# Patient Record
Sex: Male | Born: 1935
Health system: Southern US, Community
[De-identification: ages and names within clinical notes are randomized; demographics above are authoritative.]

## PROBLEM LIST (undated history)

## (undated) DIAGNOSIS — R002 Palpitations: Secondary | ICD-10-CM

## (undated) DIAGNOSIS — F329 Major depressive disorder, single episode, unspecified: Secondary | ICD-10-CM

## (undated) DIAGNOSIS — M7989 Other specified soft tissue disorders: Secondary | ICD-10-CM

## (undated) DIAGNOSIS — K219 Gastro-esophageal reflux disease without esophagitis: Secondary | ICD-10-CM

## (undated) DIAGNOSIS — J449 Chronic obstructive pulmonary disease, unspecified: Secondary | ICD-10-CM

## (undated) DIAGNOSIS — I639 Cerebral infarction, unspecified: Secondary | ICD-10-CM

## (undated) DIAGNOSIS — F32A Depression, unspecified: Secondary | ICD-10-CM

## (undated) HISTORY — DX: Cerebral infarction, unspecified: I63.9

## (undated) HISTORY — DX: Chronic obstructive pulmonary disease, unspecified: J44.9

## (undated) HISTORY — PX: GALLBLADDER SURGERY: SHX652

## (undated) HISTORY — DX: Palpitations: R00.2

## (undated) HISTORY — DX: Major depressive disorder, single episode, unspecified: F32.9

## (undated) HISTORY — DX: Depression, unspecified: F32.A

## (undated) HISTORY — DX: Other specified soft tissue disorders: M79.89

## (undated) HISTORY — PX: HERNIA REPAIR: SHX51

## (undated) HISTORY — DX: Gastro-esophageal reflux disease without esophagitis: K21.9

---

## 1999-04-04 ENCOUNTER — Ambulatory Visit: Admission: RE | Admit: 1999-04-04 | Discharge: 1999-04-04 | Payer: Self-pay | Admitting: Pulmonary Disease

## 1999-07-22 ENCOUNTER — Ambulatory Visit: Admission: RE | Admit: 1999-07-22 | Discharge: 1999-07-22 | Payer: Self-pay | Admitting: Pulmonary Disease

## 2004-09-12 ENCOUNTER — Ambulatory Visit (HOSPITAL_COMMUNITY): Admission: RE | Admit: 2004-09-12 | Discharge: 2004-09-12 | Payer: Self-pay | Admitting: Urology

## 2005-01-12 ENCOUNTER — Ambulatory Visit: Payer: Self-pay | Admitting: Cardiology

## 2005-01-12 ENCOUNTER — Observation Stay (HOSPITAL_COMMUNITY): Admission: EM | Admit: 2005-01-12 | Discharge: 2005-01-13 | Payer: Self-pay | Admitting: Emergency Medicine

## 2006-06-27 ENCOUNTER — Encounter: Admission: RE | Admit: 2006-06-27 | Discharge: 2006-06-27 | Payer: Self-pay | Admitting: Cardiovascular Disease

## 2006-07-03 ENCOUNTER — Ambulatory Visit (HOSPITAL_COMMUNITY): Admission: RE | Admit: 2006-07-03 | Discharge: 2006-07-03 | Payer: Self-pay | Admitting: Cardiovascular Disease

## 2010-10-26 DIAGNOSIS — F329 Major depressive disorder, single episode, unspecified: Secondary | ICD-10-CM | POA: Insufficient documentation

## 2010-10-26 DIAGNOSIS — R002 Palpitations: Secondary | ICD-10-CM | POA: Insufficient documentation

## 2010-10-26 DIAGNOSIS — K219 Gastro-esophageal reflux disease without esophagitis: Secondary | ICD-10-CM

## 2010-10-26 DIAGNOSIS — E119 Type 2 diabetes mellitus without complications: Secondary | ICD-10-CM | POA: Insufficient documentation

## 2010-10-26 DIAGNOSIS — M7989 Other specified soft tissue disorders: Secondary | ICD-10-CM | POA: Insufficient documentation

## 2010-10-26 DIAGNOSIS — J449 Chronic obstructive pulmonary disease, unspecified: Secondary | ICD-10-CM

## 2015-03-24 DIAGNOSIS — H353111 Nonexudative age-related macular degeneration, right eye, early dry stage: Secondary | ICD-10-CM | POA: Diagnosis not present

## 2015-03-24 DIAGNOSIS — H2513 Age-related nuclear cataract, bilateral: Secondary | ICD-10-CM | POA: Diagnosis not present

## 2015-03-24 DIAGNOSIS — E119 Type 2 diabetes mellitus without complications: Secondary | ICD-10-CM | POA: Diagnosis not present

## 2015-03-24 DIAGNOSIS — Z7984 Long term (current) use of oral hypoglycemic drugs: Secondary | ICD-10-CM | POA: Diagnosis not present

## 2015-04-08 DIAGNOSIS — Z7901 Long term (current) use of anticoagulants: Secondary | ICD-10-CM | POA: Diagnosis not present

## 2015-05-09 DIAGNOSIS — Z7901 Long term (current) use of anticoagulants: Secondary | ICD-10-CM | POA: Diagnosis not present

## 2015-06-10 DIAGNOSIS — K219 Gastro-esophageal reflux disease without esophagitis: Secondary | ICD-10-CM | POA: Diagnosis not present

## 2015-06-10 DIAGNOSIS — Z8673 Personal history of transient ischemic attack (TIA), and cerebral infarction without residual deficits: Secondary | ICD-10-CM | POA: Diagnosis not present

## 2015-06-10 DIAGNOSIS — I4891 Unspecified atrial fibrillation: Secondary | ICD-10-CM | POA: Diagnosis not present

## 2015-06-10 DIAGNOSIS — E119 Type 2 diabetes mellitus without complications: Secondary | ICD-10-CM | POA: Diagnosis not present

## 2015-06-10 DIAGNOSIS — E538 Deficiency of other specified B group vitamins: Secondary | ICD-10-CM | POA: Diagnosis not present

## 2015-06-10 DIAGNOSIS — Z1389 Encounter for screening for other disorder: Secondary | ICD-10-CM | POA: Diagnosis not present

## 2015-06-10 DIAGNOSIS — E669 Obesity, unspecified: Secondary | ICD-10-CM | POA: Diagnosis not present

## 2015-06-10 DIAGNOSIS — R791 Abnormal coagulation profile: Secondary | ICD-10-CM | POA: Diagnosis not present

## 2015-06-10 DIAGNOSIS — I10 Essential (primary) hypertension: Secondary | ICD-10-CM | POA: Diagnosis not present

## 2015-06-24 DIAGNOSIS — Z7901 Long term (current) use of anticoagulants: Secondary | ICD-10-CM | POA: Diagnosis not present

## 2015-07-26 DIAGNOSIS — Z7901 Long term (current) use of anticoagulants: Secondary | ICD-10-CM | POA: Diagnosis not present

## 2015-08-29 DIAGNOSIS — Z7901 Long term (current) use of anticoagulants: Secondary | ICD-10-CM | POA: Diagnosis not present

## 2015-10-03 DIAGNOSIS — I10 Essential (primary) hypertension: Secondary | ICD-10-CM | POA: Diagnosis not present

## 2015-10-03 DIAGNOSIS — I4891 Unspecified atrial fibrillation: Secondary | ICD-10-CM | POA: Diagnosis not present

## 2015-10-03 DIAGNOSIS — M791 Myalgia: Secondary | ICD-10-CM | POA: Diagnosis not present

## 2015-10-03 DIAGNOSIS — Z139 Encounter for screening, unspecified: Secondary | ICD-10-CM | POA: Diagnosis not present

## 2015-10-03 DIAGNOSIS — Z125 Encounter for screening for malignant neoplasm of prostate: Secondary | ICD-10-CM | POA: Diagnosis not present

## 2015-10-03 DIAGNOSIS — E119 Type 2 diabetes mellitus without complications: Secondary | ICD-10-CM | POA: Diagnosis not present

## 2015-10-03 DIAGNOSIS — K219 Gastro-esophageal reflux disease without esophagitis: Secondary | ICD-10-CM | POA: Diagnosis not present

## 2015-10-03 DIAGNOSIS — Z7901 Long term (current) use of anticoagulants: Secondary | ICD-10-CM | POA: Diagnosis not present

## 2015-10-03 DIAGNOSIS — E782 Mixed hyperlipidemia: Secondary | ICD-10-CM | POA: Diagnosis not present

## 2015-10-03 DIAGNOSIS — F329 Major depressive disorder, single episode, unspecified: Secondary | ICD-10-CM | POA: Diagnosis not present

## 2015-11-04 DIAGNOSIS — R791 Abnormal coagulation profile: Secondary | ICD-10-CM | POA: Diagnosis not present

## 2015-12-09 DIAGNOSIS — Z7901 Long term (current) use of anticoagulants: Secondary | ICD-10-CM | POA: Diagnosis not present

## 2015-12-22 DIAGNOSIS — Z6832 Body mass index (BMI) 32.0-32.9, adult: Secondary | ICD-10-CM | POA: Diagnosis not present

## 2015-12-22 DIAGNOSIS — Z23 Encounter for immunization: Secondary | ICD-10-CM | POA: Diagnosis not present

## 2015-12-22 DIAGNOSIS — N281 Cyst of kidney, acquired: Secondary | ICD-10-CM | POA: Diagnosis not present

## 2015-12-22 DIAGNOSIS — R05 Cough: Secondary | ICD-10-CM | POA: Diagnosis not present

## 2015-12-22 DIAGNOSIS — F329 Major depressive disorder, single episode, unspecified: Secondary | ICD-10-CM | POA: Diagnosis not present

## 2016-01-12 DIAGNOSIS — N401 Enlarged prostate with lower urinary tract symptoms: Secondary | ICD-10-CM | POA: Diagnosis not present

## 2016-01-12 DIAGNOSIS — R338 Other retention of urine: Secondary | ICD-10-CM | POA: Diagnosis not present

## 2016-01-12 DIAGNOSIS — N281 Cyst of kidney, acquired: Secondary | ICD-10-CM | POA: Diagnosis not present

## 2016-01-12 DIAGNOSIS — N538 Other male sexual dysfunction: Secondary | ICD-10-CM | POA: Diagnosis not present

## 2016-01-16 DIAGNOSIS — N318 Other neuromuscular dysfunction of bladder: Secondary | ICD-10-CM | POA: Diagnosis not present

## 2016-01-16 DIAGNOSIS — N401 Enlarged prostate with lower urinary tract symptoms: Secondary | ICD-10-CM | POA: Diagnosis not present

## 2016-01-16 DIAGNOSIS — R791 Abnormal coagulation profile: Secondary | ICD-10-CM | POA: Diagnosis not present

## 2016-01-16 DIAGNOSIS — N3020 Other chronic cystitis without hematuria: Secondary | ICD-10-CM | POA: Diagnosis not present

## 2016-01-19 DIAGNOSIS — N3020 Other chronic cystitis without hematuria: Secondary | ICD-10-CM | POA: Diagnosis not present

## 2016-01-19 DIAGNOSIS — R350 Frequency of micturition: Secondary | ICD-10-CM | POA: Diagnosis not present

## 2016-01-19 DIAGNOSIS — N411 Chronic prostatitis: Secondary | ICD-10-CM | POA: Diagnosis not present

## 2016-01-19 DIAGNOSIS — Z01810 Encounter for preprocedural cardiovascular examination: Secondary | ICD-10-CM | POA: Diagnosis not present

## 2016-01-19 DIAGNOSIS — J449 Chronic obstructive pulmonary disease, unspecified: Secondary | ICD-10-CM | POA: Diagnosis not present

## 2016-01-19 DIAGNOSIS — N401 Enlarged prostate with lower urinary tract symptoms: Secondary | ICD-10-CM | POA: Diagnosis not present

## 2016-01-19 DIAGNOSIS — R351 Nocturia: Secondary | ICD-10-CM | POA: Diagnosis not present

## 2016-01-19 DIAGNOSIS — R338 Other retention of urine: Secondary | ICD-10-CM | POA: Diagnosis not present

## 2016-01-19 DIAGNOSIS — N410 Acute prostatitis: Secondary | ICD-10-CM | POA: Diagnosis not present

## 2016-01-19 DIAGNOSIS — E119 Type 2 diabetes mellitus without complications: Secondary | ICD-10-CM | POA: Diagnosis not present

## 2016-01-19 DIAGNOSIS — I10 Essential (primary) hypertension: Secondary | ICD-10-CM | POA: Diagnosis not present

## 2016-01-19 DIAGNOSIS — N318 Other neuromuscular dysfunction of bladder: Secondary | ICD-10-CM | POA: Diagnosis not present

## 2016-01-20 DIAGNOSIS — N401 Enlarged prostate with lower urinary tract symptoms: Secondary | ICD-10-CM | POA: Diagnosis not present

## 2016-01-20 DIAGNOSIS — E119 Type 2 diabetes mellitus without complications: Secondary | ICD-10-CM | POA: Diagnosis not present

## 2016-01-20 DIAGNOSIS — J449 Chronic obstructive pulmonary disease, unspecified: Secondary | ICD-10-CM | POA: Diagnosis not present

## 2016-01-20 DIAGNOSIS — N410 Acute prostatitis: Secondary | ICD-10-CM | POA: Diagnosis not present

## 2016-01-20 DIAGNOSIS — N411 Chronic prostatitis: Secondary | ICD-10-CM | POA: Diagnosis not present

## 2016-01-20 DIAGNOSIS — R338 Other retention of urine: Secondary | ICD-10-CM | POA: Diagnosis not present

## 2016-01-20 DIAGNOSIS — I10 Essential (primary) hypertension: Secondary | ICD-10-CM | POA: Diagnosis not present

## 2016-01-20 DIAGNOSIS — R351 Nocturia: Secondary | ICD-10-CM | POA: Diagnosis not present

## 2016-01-20 DIAGNOSIS — R350 Frequency of micturition: Secondary | ICD-10-CM | POA: Diagnosis not present

## 2016-01-21 DIAGNOSIS — R350 Frequency of micturition: Secondary | ICD-10-CM | POA: Diagnosis not present

## 2016-01-21 DIAGNOSIS — N411 Chronic prostatitis: Secondary | ICD-10-CM | POA: Diagnosis not present

## 2016-01-21 DIAGNOSIS — R351 Nocturia: Secondary | ICD-10-CM | POA: Diagnosis not present

## 2016-01-21 DIAGNOSIS — E119 Type 2 diabetes mellitus without complications: Secondary | ICD-10-CM | POA: Diagnosis not present

## 2016-01-21 DIAGNOSIS — I10 Essential (primary) hypertension: Secondary | ICD-10-CM | POA: Diagnosis not present

## 2016-01-21 DIAGNOSIS — R338 Other retention of urine: Secondary | ICD-10-CM | POA: Diagnosis not present

## 2016-01-21 DIAGNOSIS — J449 Chronic obstructive pulmonary disease, unspecified: Secondary | ICD-10-CM | POA: Diagnosis not present

## 2016-01-21 DIAGNOSIS — N410 Acute prostatitis: Secondary | ICD-10-CM | POA: Diagnosis not present

## 2016-01-21 DIAGNOSIS — N401 Enlarged prostate with lower urinary tract symptoms: Secondary | ICD-10-CM | POA: Diagnosis not present

## 2016-01-23 DIAGNOSIS — R791 Abnormal coagulation profile: Secondary | ICD-10-CM | POA: Diagnosis not present

## 2016-01-23 DIAGNOSIS — N3020 Other chronic cystitis without hematuria: Secondary | ICD-10-CM | POA: Diagnosis not present

## 2016-01-23 DIAGNOSIS — R351 Nocturia: Secondary | ICD-10-CM | POA: Diagnosis not present

## 2016-01-23 DIAGNOSIS — N401 Enlarged prostate with lower urinary tract symptoms: Secondary | ICD-10-CM | POA: Diagnosis not present

## 2016-01-30 DIAGNOSIS — N401 Enlarged prostate with lower urinary tract symptoms: Secondary | ICD-10-CM | POA: Diagnosis not present

## 2016-01-30 DIAGNOSIS — N3020 Other chronic cystitis without hematuria: Secondary | ICD-10-CM | POA: Diagnosis not present

## 2016-02-01 DIAGNOSIS — I4891 Unspecified atrial fibrillation: Secondary | ICD-10-CM | POA: Diagnosis not present

## 2016-02-01 DIAGNOSIS — R319 Hematuria, unspecified: Secondary | ICD-10-CM | POA: Diagnosis not present

## 2016-02-28 DIAGNOSIS — R351 Nocturia: Secondary | ICD-10-CM | POA: Diagnosis not present

## 2016-02-28 DIAGNOSIS — N3020 Other chronic cystitis without hematuria: Secondary | ICD-10-CM | POA: Diagnosis not present

## 2016-02-28 DIAGNOSIS — N318 Other neuromuscular dysfunction of bladder: Secondary | ICD-10-CM | POA: Diagnosis not present

## 2016-02-28 DIAGNOSIS — N401 Enlarged prostate with lower urinary tract symptoms: Secondary | ICD-10-CM | POA: Diagnosis not present

## 2017-03-21 DIAGNOSIS — I4891 Unspecified atrial fibrillation: Secondary | ICD-10-CM | POA: Diagnosis not present

## 2017-03-25 DIAGNOSIS — Z7901 Long term (current) use of anticoagulants: Secondary | ICD-10-CM | POA: Diagnosis not present

## 2017-04-09 DIAGNOSIS — R791 Abnormal coagulation profile: Secondary | ICD-10-CM | POA: Diagnosis not present

## 2017-04-11 DIAGNOSIS — Z7901 Long term (current) use of anticoagulants: Secondary | ICD-10-CM | POA: Diagnosis not present

## 2017-04-25 DIAGNOSIS — R791 Abnormal coagulation profile: Secondary | ICD-10-CM | POA: Diagnosis not present

## 2017-05-02 DIAGNOSIS — R791 Abnormal coagulation profile: Secondary | ICD-10-CM | POA: Diagnosis not present

## 2017-05-09 DIAGNOSIS — R791 Abnormal coagulation profile: Secondary | ICD-10-CM | POA: Diagnosis not present

## 2017-05-16 DIAGNOSIS — R791 Abnormal coagulation profile: Secondary | ICD-10-CM | POA: Diagnosis not present

## 2017-05-23 DIAGNOSIS — Z7901 Long term (current) use of anticoagulants: Secondary | ICD-10-CM | POA: Diagnosis not present

## 2017-05-27 DIAGNOSIS — N318 Other neuromuscular dysfunction of bladder: Secondary | ICD-10-CM | POA: Diagnosis not present

## 2017-05-27 DIAGNOSIS — R351 Nocturia: Secondary | ICD-10-CM | POA: Diagnosis not present

## 2017-05-27 DIAGNOSIS — N401 Enlarged prostate with lower urinary tract symptoms: Secondary | ICD-10-CM | POA: Diagnosis not present

## 2017-05-27 DIAGNOSIS — N3020 Other chronic cystitis without hematuria: Secondary | ICD-10-CM | POA: Diagnosis not present

## 2017-06-06 DIAGNOSIS — R791 Abnormal coagulation profile: Secondary | ICD-10-CM | POA: Diagnosis not present

## 2017-06-20 DIAGNOSIS — Z7901 Long term (current) use of anticoagulants: Secondary | ICD-10-CM | POA: Diagnosis not present

## 2017-07-25 DIAGNOSIS — R791 Abnormal coagulation profile: Secondary | ICD-10-CM | POA: Diagnosis not present

## 2017-08-08 DIAGNOSIS — R791 Abnormal coagulation profile: Secondary | ICD-10-CM | POA: Diagnosis not present

## 2017-08-22 DIAGNOSIS — Z7901 Long term (current) use of anticoagulants: Secondary | ICD-10-CM | POA: Diagnosis not present

## 2017-09-25 DIAGNOSIS — Z7901 Long term (current) use of anticoagulants: Secondary | ICD-10-CM | POA: Diagnosis not present

## 2017-10-21 DIAGNOSIS — J441 Chronic obstructive pulmonary disease with (acute) exacerbation: Secondary | ICD-10-CM | POA: Diagnosis not present

## 2017-10-21 DIAGNOSIS — Z6834 Body mass index (BMI) 34.0-34.9, adult: Secondary | ICD-10-CM | POA: Diagnosis not present

## 2017-10-21 DIAGNOSIS — J029 Acute pharyngitis, unspecified: Secondary | ICD-10-CM | POA: Diagnosis not present

## 2017-10-30 DIAGNOSIS — Z7901 Long term (current) use of anticoagulants: Secondary | ICD-10-CM | POA: Diagnosis not present

## 2017-11-08 DIAGNOSIS — Z6834 Body mass index (BMI) 34.0-34.9, adult: Secondary | ICD-10-CM | POA: Diagnosis not present

## 2017-11-08 DIAGNOSIS — I509 Heart failure, unspecified: Secondary | ICD-10-CM | POA: Diagnosis not present

## 2017-11-14 ENCOUNTER — Emergency Department (HOSPITAL_COMMUNITY): Payer: Medicare HMO

## 2017-11-14 ENCOUNTER — Other Ambulatory Visit: Payer: Self-pay

## 2017-11-14 ENCOUNTER — Inpatient Hospital Stay (HOSPITAL_COMMUNITY)
Admission: EM | Admit: 2017-11-14 | Discharge: 2017-11-20 | DRG: 061 | Disposition: A | Discharge status: Rehabilitation Facility | Payer: Medicare HMO | Attending: Neurology | Admitting: Neurology

## 2017-11-14 DIAGNOSIS — F329 Major depressive disorder, single episode, unspecified: Secondary | ICD-10-CM | POA: Diagnosis present

## 2017-11-14 DIAGNOSIS — I5021 Acute systolic (congestive) heart failure: Secondary | ICD-10-CM | POA: Diagnosis not present

## 2017-11-14 DIAGNOSIS — I6300 Cerebral infarction due to thrombosis of unspecified precerebral artery: Secondary | ICD-10-CM | POA: Diagnosis not present

## 2017-11-14 DIAGNOSIS — I429 Cardiomyopathy, unspecified: Secondary | ICD-10-CM | POA: Diagnosis present

## 2017-11-14 DIAGNOSIS — Z6835 Body mass index (BMI) 35.0-35.9, adult: Secondary | ICD-10-CM

## 2017-11-14 DIAGNOSIS — J449 Chronic obstructive pulmonary disease, unspecified: Secondary | ICD-10-CM | POA: Diagnosis not present

## 2017-11-14 DIAGNOSIS — E1159 Type 2 diabetes mellitus with other circulatory complications: Secondary | ICD-10-CM | POA: Diagnosis not present

## 2017-11-14 DIAGNOSIS — Z87891 Personal history of nicotine dependence: Secondary | ICD-10-CM | POA: Diagnosis not present

## 2017-11-14 DIAGNOSIS — I69354 Hemiplegia and hemiparesis following cerebral infarction affecting left non-dominant side: Secondary | ICD-10-CM | POA: Diagnosis not present

## 2017-11-14 DIAGNOSIS — I639 Cerebral infarction, unspecified: Secondary | ICD-10-CM

## 2017-11-14 DIAGNOSIS — R402252 Coma scale, best verbal response, oriented, at arrival to emergency department: Secondary | ICD-10-CM | POA: Diagnosis present

## 2017-11-14 DIAGNOSIS — E785 Hyperlipidemia, unspecified: Secondary | ICD-10-CM | POA: Diagnosis not present

## 2017-11-14 DIAGNOSIS — Z8673 Personal history of transient ischemic attack (TIA), and cerebral infarction without residual deficits: Secondary | ICD-10-CM | POA: Diagnosis not present

## 2017-11-14 DIAGNOSIS — R29818 Other symptoms and signs involving the nervous system: Secondary | ICD-10-CM | POA: Diagnosis not present

## 2017-11-14 DIAGNOSIS — R29810 Facial weakness: Secondary | ICD-10-CM | POA: Diagnosis not present

## 2017-11-14 DIAGNOSIS — E669 Obesity, unspecified: Secondary | ICD-10-CM | POA: Diagnosis present

## 2017-11-14 DIAGNOSIS — Z23 Encounter for immunization: Secondary | ICD-10-CM | POA: Diagnosis not present

## 2017-11-14 DIAGNOSIS — I5043 Acute on chronic combined systolic (congestive) and diastolic (congestive) heart failure: Secondary | ICD-10-CM | POA: Diagnosis not present

## 2017-11-14 DIAGNOSIS — E876 Hypokalemia: Secondary | ICD-10-CM | POA: Diagnosis not present

## 2017-11-14 DIAGNOSIS — I5041 Acute combined systolic (congestive) and diastolic (congestive) heart failure: Secondary | ICD-10-CM

## 2017-11-14 DIAGNOSIS — I482 Chronic atrial fibrillation, unspecified: Secondary | ICD-10-CM

## 2017-11-14 DIAGNOSIS — I361 Nonrheumatic tricuspid (valve) insufficiency: Secondary | ICD-10-CM | POA: Diagnosis not present

## 2017-11-14 DIAGNOSIS — G8194 Hemiplegia, unspecified affecting left nondominant side: Secondary | ICD-10-CM | POA: Diagnosis present

## 2017-11-14 DIAGNOSIS — D62 Acute posthemorrhagic anemia: Secondary | ICD-10-CM

## 2017-11-14 DIAGNOSIS — E119 Type 2 diabetes mellitus without complications: Secondary | ICD-10-CM | POA: Diagnosis not present

## 2017-11-14 DIAGNOSIS — R402362 Coma scale, best motor response, obeys commands, at arrival to emergency department: Secondary | ICD-10-CM | POA: Diagnosis present

## 2017-11-14 DIAGNOSIS — I63511 Cerebral infarction due to unspecified occlusion or stenosis of right middle cerebral artery: Principal | ICD-10-CM | POA: Diagnosis present

## 2017-11-14 DIAGNOSIS — Z886 Allergy status to analgesic agent status: Secondary | ICD-10-CM | POA: Diagnosis not present

## 2017-11-14 DIAGNOSIS — G479 Sleep disorder, unspecified: Secondary | ICD-10-CM | POA: Diagnosis not present

## 2017-11-14 DIAGNOSIS — I10 Essential (primary) hypertension: Secondary | ICD-10-CM

## 2017-11-14 DIAGNOSIS — I110 Hypertensive heart disease with heart failure: Secondary | ICD-10-CM | POA: Diagnosis present

## 2017-11-14 DIAGNOSIS — Z7901 Long term (current) use of anticoagulants: Secondary | ICD-10-CM

## 2017-11-14 DIAGNOSIS — I4891 Unspecified atrial fibrillation: Secondary | ICD-10-CM

## 2017-11-14 DIAGNOSIS — R7309 Other abnormal glucose: Secondary | ICD-10-CM | POA: Diagnosis not present

## 2017-11-14 DIAGNOSIS — K219 Gastro-esophageal reflux disease without esophagitis: Secondary | ICD-10-CM | POA: Diagnosis present

## 2017-11-14 DIAGNOSIS — Z79899 Other long term (current) drug therapy: Secondary | ICD-10-CM | POA: Diagnosis not present

## 2017-11-14 DIAGNOSIS — R29705 NIHSS score 5: Secondary | ICD-10-CM | POA: Diagnosis present

## 2017-11-14 DIAGNOSIS — R0602 Shortness of breath: Secondary | ICD-10-CM | POA: Diagnosis not present

## 2017-11-14 DIAGNOSIS — R402142 Coma scale, eyes open, spontaneous, at arrival to emergency department: Secondary | ICD-10-CM | POA: Diagnosis present

## 2017-11-14 DIAGNOSIS — R002 Palpitations: Secondary | ICD-10-CM | POA: Diagnosis not present

## 2017-11-14 DIAGNOSIS — I481 Persistent atrial fibrillation: Secondary | ICD-10-CM | POA: Diagnosis not present

## 2017-11-14 DIAGNOSIS — E1169 Type 2 diabetes mellitus with other specified complication: Secondary | ICD-10-CM | POA: Diagnosis not present

## 2017-11-14 DIAGNOSIS — Z8659 Personal history of other mental and behavioral disorders: Secondary | ICD-10-CM | POA: Diagnosis not present

## 2017-11-14 DIAGNOSIS — J984 Other disorders of lung: Secondary | ICD-10-CM | POA: Diagnosis not present

## 2017-11-14 DIAGNOSIS — I63411 Cerebral infarction due to embolism of right middle cerebral artery: Secondary | ICD-10-CM | POA: Diagnosis not present

## 2017-11-14 DIAGNOSIS — R05 Cough: Secondary | ICD-10-CM | POA: Diagnosis not present

## 2017-11-14 DIAGNOSIS — R0789 Other chest pain: Secondary | ICD-10-CM

## 2017-11-14 DIAGNOSIS — E46 Unspecified protein-calorie malnutrition: Secondary | ICD-10-CM | POA: Diagnosis not present

## 2017-11-14 DIAGNOSIS — R531 Weakness: Secondary | ICD-10-CM | POA: Diagnosis not present

## 2017-11-14 DIAGNOSIS — I255 Ischemic cardiomyopathy: Secondary | ICD-10-CM | POA: Diagnosis not present

## 2017-11-14 LAB — CBC
HCT: 46.5 % (ref 39.0–52.0)
Hemoglobin: 14.3 g/dL (ref 13.0–17.0)
MCH: 29.1 pg (ref 26.0–34.0)
MCHC: 30.8 g/dL (ref 30.0–36.0)
MCV: 94.7 fL (ref 78.0–100.0)
Platelets: 222 10*3/uL (ref 150–400)
RBC: 4.91 MIL/uL (ref 4.22–5.81)
RDW: 13.9 % (ref 11.5–15.5)
WBC: 7.9 10*3/uL (ref 4.0–10.5)

## 2017-11-14 LAB — DIFFERENTIAL
Abs Immature Granulocytes: 0.1 10*3/uL (ref 0.0–0.1)
Basophils Absolute: 0 10*3/uL (ref 0.0–0.1)
Basophils Relative: 1 %
Eosinophils Absolute: 0.2 10*3/uL (ref 0.0–0.7)
Eosinophils Relative: 2 %
Immature Granulocytes: 1 %
Lymphocytes Relative: 22 %
Lymphs Abs: 1.7 10*3/uL (ref 0.7–4.0)
Monocytes Absolute: 0.8 10*3/uL (ref 0.1–1.0)
Monocytes Relative: 10 %
Neutro Abs: 5.1 10*3/uL (ref 1.7–7.7)
Neutrophils Relative %: 64 %

## 2017-11-14 LAB — I-STAT CHEM 8, ED
BUN: 17 mg/dL (ref 8–23)
CALCIUM ION: 1.15 mmol/L (ref 1.15–1.40)
CREATININE: 1.1 mg/dL (ref 0.61–1.24)
Chloride: 102 mmol/L (ref 98–111)
GLUCOSE: 120 mg/dL — AB (ref 70–99)
HCT: 46 % (ref 39.0–52.0)
HEMOGLOBIN: 15.6 g/dL (ref 13.0–17.0)
Potassium: 4.1 mmol/L (ref 3.5–5.1)
Sodium: 143 mmol/L (ref 135–145)
TCO2: 30 mmol/L (ref 22–32)

## 2017-11-14 LAB — PROTIME-INR
INR: 1.18
Prothrombin Time: 14.9 seconds (ref 11.4–15.2)

## 2017-11-14 LAB — COMPREHENSIVE METABOLIC PANEL
ALBUMIN: 3.7 g/dL (ref 3.5–5.0)
ALT: 15 U/L (ref 0–44)
AST: 21 U/L (ref 15–41)
Alkaline Phosphatase: 74 U/L (ref 38–126)
Anion gap: 8 (ref 5–15)
BILIRUBIN TOTAL: 1 mg/dL (ref 0.3–1.2)
BUN: 14 mg/dL (ref 8–23)
CO2: 30 mmol/L (ref 22–32)
CREATININE: 1.16 mg/dL (ref 0.61–1.24)
Calcium: 8.8 mg/dL — ABNORMAL LOW (ref 8.9–10.3)
Chloride: 104 mmol/L (ref 98–111)
GFR calc Af Amer: >60 mL/min (ref >60)
GFR, EST NON AFRICAN AMERICAN: 57 mL/min — AB (ref >60)
GLUCOSE: 126 mg/dL — AB (ref 70–99)
POTASSIUM: 3.9 mmol/L (ref 3.5–5.1)
Sodium: 142 mmol/L (ref 135–145)
TOTAL PROTEIN: 7.4 g/dL (ref 6.5–8.1)

## 2017-11-14 LAB — APTT: aPTT: 28 seconds (ref 24–36)

## 2017-11-14 LAB — I-STAT TROPONIN, ED: TROPONIN I, POC: 0.01 ng/mL (ref 0.00–0.08)

## 2017-11-14 LAB — MRSA PCR SCREENING: MRSA BY PCR: NEGATIVE

## 2017-11-14 MED ORDER — IOPAMIDOL (ISOVUE-370) INJECTION 76%
50.0000 mL | Freq: Once | INTRAVENOUS | Status: AC | PRN
  Administered 2017-11-14: 50 mL via INTRAVENOUS

## 2017-11-14 MED ORDER — ACETAMINOPHEN 325 MG PO TABS
650.0000 mg | ORAL_TABLET | ORAL | Status: DC | PRN
  Administered 2017-11-15 – 2017-11-20 (×2): 650 mg via ORAL
  Filled 2017-11-14 (×2): qty 2

## 2017-11-14 MED ORDER — ACETAMINOPHEN 160 MG/5ML PO SOLN: 650.0000 mg | ORAL | Status: DC | PRN

## 2017-11-14 MED ORDER — SODIUM CHLORIDE 0.9 % IV SOLN
INTRAVENOUS | Status: DC
  Administered 2017-11-14 – 2017-11-16 (×2): via INTRAVENOUS

## 2017-11-14 MED ORDER — SODIUM CHLORIDE 0.9 % IV SOLN: 50.0000 mL | Freq: Once | INTRAVENOUS | Status: AC

## 2017-11-14 MED ORDER — SENNOSIDES-DOCUSATE SODIUM 8.6-50 MG PO TABS
1.0000 | ORAL_TABLET | Freq: Every evening | ORAL | Status: DC | PRN
  Administered 2017-11-18 – 2017-11-19 (×2): 1 via ORAL
  Filled 2017-11-14: qty 1

## 2017-11-14 MED ORDER — SODIUM CHLORIDE 0.9 % IV BOLUS
250.0000 mL | Freq: Once | INTRAVENOUS | Status: AC
  Administered 2017-11-14: 250 mL via INTRAVENOUS

## 2017-11-14 MED ORDER — STROKE: EARLY STAGES OF RECOVERY BOOK
Freq: Once | Status: DC
  Filled 2017-11-14 (×2): qty 1

## 2017-11-14 MED ORDER — ALTEPLASE (STROKE) FULL DOSE INFUSION
90.0000 mg | Freq: Once | INTRAVENOUS | Status: AC
  Administered 2017-11-14: 81 mg via INTRAVENOUS
  Filled 2017-11-14: qty 100

## 2017-11-14 MED ORDER — METOPROLOL TARTRATE 5 MG/5ML IV SOLN
2.5000 mg | Freq: Once | INTRAVENOUS | Status: AC
  Administered 2017-11-14: 2.5 mg via INTRAVENOUS
  Filled 2017-11-14: qty 5

## 2017-11-14 MED ORDER — IOPAMIDOL (ISOVUE-370) INJECTION 76%
INTRAVENOUS | Status: AC
  Filled 2017-11-14: qty 50

## 2017-11-14 MED ORDER — ACETAMINOPHEN 650 MG RE SUPP: 650.0000 mg | RECTAL | Status: DC | PRN

## 2017-11-14 NOTE — ED Provider Notes (Signed)
[facility] Emergency Department Provider Note MRN:  [id number]  Arrival date & time: 11/14/17     Chief Complaint   Code Stroke   History of Present Illness   Chad Franklin is a 83 y.o. year-old male with a history of COPD, diabetes, stroke presenting to the ED with chief complaint of weakness.  Patient endorsing sudden onset left-sided weakness, weakness to the left arm as well as left leg, constant since that time.  Denies fever or chills, no headache or vision change, no chest pain or shortness of breath, no slurred speech or trouble swallowing, no abdominal pain, no dysuria, no rash.  Symptoms are moderate in severity, no exacerbating or alleviating factors.  Review of Systems  A complete 10 system review of systems was obtained and all systems are negative except as noted in the HPI and PMH.   Patient's Health History    Past Medical History:  Diagnosis Date  . Bilateral swelling of feet   . COPD (chronic obstructive pulmonary disease)   . Depression   . Diabetes mellitus   . GERD (gastroesophageal reflux disease)   . Palpitations   . Stroke    2      No family history on file.  Social History   Socioeconomic History  . Marital status: Married    Spouse name: Not on file  . Number of children: Not on file  . Years of education: Not on file  . Highest education level: Not on file  Occupational History  . Not on file  Social Needs  . Financial resource strain: Not on file  . Food insecurity:    Worry: Not on file    Inability: Not on file  . Transportation needs:    Medical: Not on file    Non-medical: Not on file  Tobacco Use  . Smoking status: Former Smoker  Substance and Sexual Activity  . Alcohol use: No    Comment: past drinker  . Drug use: Not on file  . Sexual activity: Not on file  Lifestyle  . Physical activity:    Days per week: Not on file    Minutes per session: Not on file  . Stress: Not on file  Relationships  .  Social connections:    Talks on phone: Not on file    Gets together: Not on file    Attends religious service: Not on file    Active member of club or organization: Not on file    Attends meetings of clubs or organizations: Not on file    Relationship status: Not on file  . Intimate partner violence:    Fear of current or ex partner: Not on file    Emotionally abused: Not on file    Physically abused: Not on file    Forced sexual activity: Not on file  Other Topics Concern  . Not on file  Social History Narrative  . Not on file     Physical Exam  Vital Signs and Nursing Notes reviewed Vitals:   11/14/17 1757  BP: (!) 139/119  Pulse: 92  Resp: 18  Temp: 97.9 F (36.6 C)  SpO2: 95%    CONSTITUTIONAL: Well-appearing, NAD NEURO:  Alert and oriented x 3, moderate left-sided weakness of the arm and leg EYES:  eyes equal and reactive ENT/NECK:  no LAD, no JVD CARDIO: Regular rate, well-perfused, normal S1 and S2 PULM:  CTAB no wheezing or rhonchi GI/GU:  normal bowel sounds, non-distended, non-tender  MSK/SPINE:  No gross deformities, no edema SKIN:  no rash, atraumatic PSYCH:  Appropriate speech and behavior  Diagnostic and Interventional Summary    EKG Interpretation  Date/Time:  Thursday November 14 2017 18:03:27 EDT Ventricular Rate:  135 PR Interval:    QRS Duration: 82 QT Interval:  300 QTC Calculation: 450 R Axis:   38 Text Interpretation:  Atrial fibrillation with rapid ventricular response Abnormal ECG new from prior 10/06 Confirmed by Meridee Score [phone]) on 11/14/2017 6:15:05 PM      Labs Reviewed  COMPREHENSIVE METABOLIC PANEL - Abnormal; Notable for the following components:      Result Value   Glucose, Bld 126 (*)    Calcium 8.8 (*)    GFR calc non Af Amer 57 (*)    All other components within normal limits  I-STAT CHEM 8, ED - Abnormal; Notable for the following components:   Glucose, Bld 120 (*)    All other components within normal limits    PROTIME-INR  APTT  CBC  DIFFERENTIAL  HEMOGLOBIN A1C  LIPID PANEL  I-STAT TROPONIN, ED  CBG MONITORING, ED    CT HEAD CODE STROKE WO CONTRAST  Final Result    CT ANGIO HEAD W OR WO CONTRAST    (Results Pending)  CT ANGIO NECK W OR WO CONTRAST    (Results Pending)  CT HEAD WO CONTRAST    (Results Pending)  MR BRAIN WO CONTRAST    (Results Pending)    Medications  iopamidol (ISOVUE-370) 76 % injection (has no administration in time range)  alteplase (ACTIVASE) 1 mg/mL infusion 90 mg (has no administration in time range)    Followed by  0.9 %  sodium chloride infusion (has no administration in time range)   stroke: mapping our early stages of recovery book (has no administration in time range)  0.9 %  sodium chloride infusion (has no administration in time range)  acetaminophen (TYLENOL) tablet 650 mg (has no administration in time range)    Or  acetaminophen (TYLENOL) solution 650 mg (has no administration in time range)    Or  acetaminophen (TYLENOL) suppository 650 mg (has no administration in time range)  senna-docusate (Senokot-S) tablet 1 tablet (has no administration in time range)  iopamidol (ISOVUE-370) 76 % injection 50 mL (50 mLs Intravenous Contrast Given 11/14/17 1844)     Procedures Critical Care Critical Care Documentation Critical care time provided by me (excluding procedures): 31 minutes  Condition necessitating critical care: Acute ischemic stroke, initiation of code stroke protocol  Components of critical care management: reviewing of prior records, laboratory and imaging interpretation, frequent re-examination and reassessment of vital signs, administration of IV TPA, discussion with consultants.   ED Course and Medical Decision Making  I have reviewed the triage vital signs and the nursing notes.  Pertinent labs & imaging results that were available during my care of the patient were reviewed by me and considered in my medical decision making (see  below for details).    Acute left-sided weakness in this 83 year old male, imaging concerning for ischemic stroke.  Neurology consulted, TPA initiated, to be admitted to neurology for further care.  [name]. Pilar Plate, MD [facility] Emergency Medicine [facility] [email or web address]  Final Clinical Impressions(s) / ED Diagnoses     ICD-10-CM   1. Acute ischemic stroke [facility]) I63.9     ED Discharge Orders    None         Sabas Sous, MD 11/14/17 1924

## 2017-11-14 NOTE — ED Provider Notes (Addendum)
Patient placed in Quick Look pathway, seen and evaluated   Chief Complaint: left side weakness  HPI:   Chad Franklin is a 83 y.o. male who arrived to the ED via private car with his family. Patient's family reports that approximately 3 pm patient began having weakness in the left arm and c/o indigestion. They were going to call EMS but patient refused to come in by ambulance.   ROS: Neuro: left arm weakness  Physical Exam:  BP (!) 139/119 (BP Location: Right Arm)   Pulse 92   Temp 97.9 F (36.6 C) (Oral)   Resp 18   SpO2 95%    Gen: No distress  Neuro: Awake and Alert, patient unable to lift left arm  Skin: Warm and dry    Dr. Charm Barges in to see the patient. Code Stroke called.   Initiation of care has begun. The patient has been counseled on the process, plan, and necessity for staying for the completion/evaluation, and the remainder of the medical screening examination    Janne Napoleon, NP 11/14/17 1812    Damian Leavell Pueblito, NP 11/14/17 1813    Terrilee Files, MD 11/15/17 [phone]

## 2017-11-14 NOTE — ED Triage Notes (Signed)
Pt LSN at 1430. Pt reports at 1500 pt had weakness to L arm and L leg. Pt has drift to L arm and leg.

## 2017-11-14 NOTE — ED Notes (Signed)
Family at bedside. 

## 2017-11-14 NOTE — H&P (Addendum)
Chief Complaint: Left arm weakness  History obtained from: Patient and Chart     HPI:                                                                                                                                       Chad Franklin is an 83 y.o. male past medical history of diabetes mellitus, COPD, stroke (patient and wife denies this) on Coumadin  For unclear reeason presents  the emergency room with sudden onset left-sided weakness that began around 3 PM witnessed by his wife.  They did not call EMS and was stroke alerted by ED triage.  CT head showed old infarcts, however no acute abnormality.  NIH stroke scale was 5. TPA was administered after waiting for INR which returned as 1.1.  Patient states he is compliant with Coumadin and took a dose this morning.  Last time INR was checked was a month ago.  He had a neck was performed which showed no large vessel occlusion  Date last known well: 8.29.19 Time last known well: 3pm tPA Given: Yes NIHSS on arrival : 5 Baseline MRS 0    Past Medical History:  Diagnosis Date  . Bilateral swelling of feet   . COPD (chronic obstructive pulmonary disease)   . Depression   . Diabetes mellitus   . GERD (gastroesophageal reflux disease)   . Palpitations   . Stroke    2      No family history on file. Social History:  reports that he has quit smoking. He does not have any smokeless tobacco history on file. He reports that he does not drink alcohol. His drug history is not on file.   She does not smoke or drink significantly.  Allergies:  Allergies  Allergen Reactions  . Naproxen Sodium     Medications:                                                                                                                        I reviewed home medications listed in epic is incorrect.  Is on Coumadin and not on aspirin   ROS:  14 systems reviewed and negative except above   Examination:                                                                                                      General: Appears well-developed and well-nourished.  Psych: Affect appropriate to situation Eyes: No scleral injection HENT: No OP obstrucion Head: Normocephalic.  Cardiovascular: Normal rate and regular rhythm.  Respiratory: Effort normal and breath sounds normal to anterior ascultation GI: Soft.  No distension. There is no tenderness.  Skin: WDI    Neurological Examination Mental Status: Alert, oriented, thought content appropriate.  Speech fluent without evidence of aphasia. Able to follow 3 step commands without difficulty. Cranial Nerves: II: Visual fields grossly normal,  III,IV, VI: ptosis not present, extra-ocular motions intact bilaterally, pupils equal, round, reactive to light and accommodation V,VII: smile symmetric, facial light touch sensation reduced on left side VIII: hearing normal bilaterally IX,X: uvula rises symmetrically XI: bilateral shoulder shrug XII: midline tongue extension Motor: Right : Upper extremity   5/5    Left:     Upper extremity   2/5  Lower extremity   5/5     Lower extremity   3/5 Tone and bulk:normal tone throughout; no atrophy noted Sensory: reduced  sensation to light touch on left side over face, arm and leg Deep Tendon Reflexes: 2+ and symmetric throughout Plantars: Right: downgoing   Left: downgoing Cerebellar: normal finger-to-nose, normal rapid alternating movements and normal heel-to-shin test Gait: normal gait and station     Lab Results: Basic Metabolic Panel: Recent Labs  Lab 11/14/17 1816  NA 143  K 4.1  CL 102  GLUCOSE 120*  BUN 17  CREATININE 1.10    CBC: Recent Labs  Lab 11/14/17 1812 11/14/17 1816  WBC 7.9  --   NEUTROABS 5.1  --   HGB 14.3 15.6  HCT 46.5 46.0  MCV 94.7  --   PLT 222  --     Coagulation  Studies: Recent Labs    11/14/17 1812  LABPROT 14.9  INR 1.18    Imaging: Ct Head Code Stroke Wo Contrast  Result Date: 11/14/2017 CLINICAL DATA:  Code stroke. LEFT extremity weakness. Last seen normal at 14 30 hours. History of diabetes, stroke. EXAM: CT HEAD WITHOUT CONTRAST TECHNIQUE: Contiguous axial images were obtained from the base of the skull through the vertex without intravenous contrast. COMPARISON:  MRI head January 12, 2005 FINDINGS: BRAIN: No intraparenchymal hemorrhage, mass effect nor midline shift. The ventricles and sulci are normal for age. Multiple chronic appearing bilateral basal ganglia with additional age indeterminate basal ganglia lacunar infarcts. Small LEFT prior occipital lobe encephalomalacia. Patchy supratentorial white matter hypodensities within normal range for patient's age, though non-specific are most compatible with chronic small vessel ischemic disease. No acute large vascular territory infarcts. No abnormal extra-axial fluid collections. Basal cisterns are patent. VASCULAR: Trace calcific atherosclerosis of the carotid siphons. No hyperdense vessel. SKULL: No skull fracture. No significant scalp soft tissue swelling. SINUSES/ORBITS: Chronic RIGHT maxillary sinusitis. LEFT maxillary mucosal retention cyst. Mastoid air cells are well  aerated.The included ocular globes and orbital contents are non-suspicious. OTHER: Patient is edentulous. ASPECTS [facility] Stroke Program Early CT Score) - Ganglionic level infarction (caudate, lentiform nuclei, internal capsule, insula, M1-M3 cortex): 6 - Supraganglionic infarction (M4-M6 cortex): 3 Total score (0-10 with 10 being normal): 9 IMPRESSION: 1. Age indeterminate basal ganglia lacunar infarcts superimposed on old lacunar infarcts. 2. Old small LEFT parietoccipital lobe infarct. 3. Moderate chronic small vessel ischemic changes. 4. ASPECTS is 9. 5. Critical Value/emergent results text paged to Dr.AROOR, Neurology via AMION  secure system on 11/14/2017 at 6:29 pm, including interpreting physician's phone number. Electronically Signed   By: Awilda Metro M.D.   On: 11/14/2017 18:29     ASSESSMENT AND PLAN  83 year old male with past medical history of diabetes mellitus, COPD on Coumadin for unclear reason presents as a stroke alert for left side weakness.  CT head negative for bleed and INR was 1.1 and patient received TPA.  CTA head and neck negative for large vessel occlusion.  Acute Ischemic Stroke    # MRI of the brain without contrast #Transthoracic Echo  # no AC/AP until 24 hrs post tPA # repeat Ct head in 24hrs #Start or continue Atorvastatin 80 mg/other high intensity statin # BP goal: permissive HTN upto 180/105 mmHg # HBAIC and Lipid profile # Telemetry monitoring # Frequent neuro checks # NPO until passes stroke swallow screen  HLD Continue statin if he passess swallow eval  Bp goal 180/105 mmHg and below   DVT PPX SCD Full code  Please page stroke NP  Or  PA  Or MD from 8am -4 pm  as this patient from this time will be  followed by the stroke.   You can look them up on [email or web address]  Password [facility]      Sushanth Aroor [facility] Pager Number [phone]

## 2017-11-14 NOTE — Progress Notes (Signed)
Pharmacist Code Stroke Response  Notified to mix tPA at 1839 by Dr. Laurence Slate Delivered tPA to RN at 1847  Issues/delays encountered (if applicable): Patient on warfarin, awaiting INR.   Cheyenne Schumm S. Merilynn Finland, PharmD, BCPS Clinical Staff Pharmacist Pager [phone]  Misty Stanley [facility] 11/14/17 6:53 PM

## 2017-11-14 NOTE — Progress Notes (Signed)
Neuro MD paged with HR sustained in 140-150s hx of A. Fib. Order for 250cc bolus of NS and 2.5mg  Lopressor.

## 2017-11-15 ENCOUNTER — Inpatient Hospital Stay (HOSPITAL_COMMUNITY): Payer: Medicare HMO

## 2017-11-15 DIAGNOSIS — I63411 Cerebral infarction due to embolism of right middle cerebral artery: Secondary | ICD-10-CM

## 2017-11-15 DIAGNOSIS — E785 Hyperlipidemia, unspecified: Secondary | ICD-10-CM

## 2017-11-15 DIAGNOSIS — E1159 Type 2 diabetes mellitus with other circulatory complications: Secondary | ICD-10-CM

## 2017-11-15 DIAGNOSIS — I361 Nonrheumatic tricuspid (valve) insufficiency: Secondary | ICD-10-CM

## 2017-11-15 LAB — LIPID PANEL
Cholesterol: 116 mg/dL (ref 0–200)
HDL: 39 mg/dL — AB (ref >40)
LDL CALC: 66 mg/dL (ref 0–99)
TRIGLYCERIDES: 54 mg/dL (ref <150)
Total CHOL/HDL Ratio: 3 RATIO
VLDL: 11 mg/dL (ref 0–40)

## 2017-11-15 LAB — GLUCOSE, CAPILLARY
GLUCOSE-CAPILLARY: 210 mg/dL — AB (ref 70–99)
Glucose-Capillary: 121 mg/dL — ABNORMAL HIGH (ref 70–99)
Glucose-Capillary: 153 mg/dL — ABNORMAL HIGH (ref 70–99)

## 2017-11-15 LAB — TROPONIN I

## 2017-11-15 LAB — ECHOCARDIOGRAM COMPLETE: WEIGHTICAEL: 3693.15 [oz_av]

## 2017-11-15 LAB — HEMOGLOBIN A1C
Hgb A1c MFr Bld: 5.8 % — ABNORMAL HIGH (ref 4.8–5.6)
Mean Plasma Glucose: 119.76 mg/dL

## 2017-11-15 MED ORDER — BISACODYL 10 MG RE SUPP
10.0000 mg | Freq: Once | RECTAL | Status: DC
  Filled 2017-11-15: qty 1

## 2017-11-15 MED ORDER — PRAVASTATIN SODIUM 20 MG PO TABS
10.0000 mg | ORAL_TABLET | Freq: Every day | ORAL | Status: DC
  Administered 2017-11-15 – 2017-11-20 (×6): 10 mg via ORAL
  Filled 2017-11-15 (×6): qty 1

## 2017-11-15 MED ORDER — ONDANSETRON HCL 4 MG/2ML IJ SOLN
4.0000 mg | INTRAMUSCULAR | Status: DC | PRN
  Administered 2017-11-15: 4 mg via INTRAVENOUS

## 2017-11-15 MED ORDER — INSULIN ASPART 100 UNIT/ML ~~LOC~~ SOLN: 0.0000 [IU] | Freq: Every day | SUBCUTANEOUS | Status: DC

## 2017-11-15 MED ORDER — PAROXETINE HCL 20 MG PO TABS
20.0000 mg | ORAL_TABLET | Freq: Every day | ORAL | Status: DC
  Administered 2017-11-15 – 2017-11-20 (×6): 20 mg via ORAL
  Filled 2017-11-15 (×6): qty 1

## 2017-11-15 MED ORDER — METOPROLOL TARTRATE 50 MG PO TABS: 50.0000 mg | ORAL_TABLET | Freq: Two times a day (BID) | ORAL | Status: DC

## 2017-11-15 MED ORDER — INSULIN ASPART 100 UNIT/ML ~~LOC~~ SOLN
0.0000 [IU] | Freq: Three times a day (TID) | SUBCUTANEOUS | Status: DC
  Administered 2017-11-15: 2 [IU] via SUBCUTANEOUS
  Administered 2017-11-15: 5 [IU] via SUBCUTANEOUS
  Administered 2017-11-16: 3 [IU] via SUBCUTANEOUS
  Administered 2017-11-16: 2 [IU] via SUBCUTANEOUS
  Administered 2017-11-17: 3 [IU] via SUBCUTANEOUS
  Administered 2017-11-18 – 2017-11-19 (×4): 2 [IU] via SUBCUTANEOUS
  Administered 2017-11-19: 3 [IU] via SUBCUTANEOUS
  Administered 2017-11-20 (×2): 2 [IU] via SUBCUTANEOUS

## 2017-11-15 MED ORDER — METOPROLOL TARTRATE 25 MG PO TABS
25.0000 mg | ORAL_TABLET | Freq: Two times a day (BID) | ORAL | Status: DC
  Administered 2017-11-15: 25 mg via ORAL
  Filled 2017-11-15: qty 1

## 2017-11-15 MED ORDER — ADULT MULTIVITAMIN W/MINERALS CH
1.0000 | ORAL_TABLET | Freq: Every day | ORAL | Status: DC
  Administered 2017-11-15 – 2017-11-20 (×6): 1 via ORAL
  Filled 2017-11-15 (×6): qty 1

## 2017-11-15 MED ORDER — MORPHINE SULFATE (PF) 2 MG/ML IV SOLN: 1.0000 mg | INTRAVENOUS | Status: DC | PRN

## 2017-11-15 MED ORDER — METOPROLOL TARTRATE 5 MG/5ML IV SOLN
5.0000 mg | Freq: Once | INTRAVENOUS | Status: DC
  Filled 2017-11-15: qty 5

## 2017-11-15 MED ORDER — METOPROLOL TARTRATE 25 MG PO TABS
12.5000 mg | ORAL_TABLET | Freq: Two times a day (BID) | ORAL | Status: DC
  Administered 2017-11-16: 12.5 mg via ORAL
  Filled 2017-11-15 (×2): qty 1

## 2017-11-15 MED ORDER — ONDANSETRON HCL 4 MG/2ML IJ SOLN
INTRAMUSCULAR | Status: AC
  Filled 2017-11-15: qty 2

## 2017-11-15 MED ORDER — VERAPAMIL HCL ER 240 MG PO TBCR
240.0000 mg | EXTENDED_RELEASE_TABLET | Freq: Every day | ORAL | Status: DC
  Administered 2017-11-15 – 2017-11-16 (×2): 240 mg via ORAL
  Filled 2017-11-15 (×2): qty 1

## 2017-11-15 NOTE — Progress Notes (Signed)
Pt came to dept was on scan table complained of having a hard time breathing RN Amy was there and she contacted MD who said not to do MRI at this time

## 2017-11-15 NOTE — Progress Notes (Signed)
OT Cancellation Note  Patient Details Name: Chad Franklin MRN: [id number] DOB: 1934/03/27   Cancelled Treatment:    Reason Eval/Treat Not Completed: Active bedrest order;Patient not medically ready OT order received and appreciated however this conflicts with current bedrest order set. Please increase activity tolerance as appropriate and remove bedrest from orders. . Please contact OT at [phone] if bed rest order is discontinued. OT will hold evaluation at this time and will check back as time allows pending increased activity orders.   Felecia Shelling, OTR/L  Acute Rehabilitation Services Pager: [phone] Office: [phone] .  11/15/2017, 7:33 AM

## 2017-11-15 NOTE — Progress Notes (Signed)
Pt vomited and stroke MD, Dr. Roda Shutters was notified. Antemetic was ordered, and stat head CT. MD came to evaluate pt. RN will continue to monitor. Vanessa Ralphs, RN

## 2017-11-15 NOTE — Progress Notes (Addendum)
PT Cancellation Note  Patient Details Name: Chad Franklin MRN: [id number] DOB: [birth year]   Cancelled Treatment:    Reason Eval/Treat Not Completed: Active bedrest order. PT eval order received. Pt with order for strict bedrest. Please update activity order, when appropriate, for PT to proceed with eval. Thank you.  Addendum 1332: Pt received TPA 11/14/17 at 1900. PT to follow up tomorrow. RN aware and in agreement.    Ilda Foil 11/15/2017, 8:37 AM   Aida Raider, PT  Office # [phone] Pager [phone]

## 2017-11-15 NOTE — Progress Notes (Signed)
Pt unable to have MRI due to difficulty breathing while lying flat.  MD was notified. RN will continue to monitor.  Vanessa Ralphs, RN

## 2017-11-15 NOTE — Progress Notes (Signed)
SLP Cancellation Note  Patient Details Name: Chad Franklin MRN: [id number] DOB: 15-Nov-1934   Cancelled treatment:       Reason Eval/Treat Not Completed: Patient at procedure or test/unavailable   Cyra Spader, Riley Nearing 11/15/2017, 1:37 PM

## 2017-11-15 NOTE — CV Procedure (Signed)
Attempted 2D Echo, patient is eating breakfast, will try again at a later time.    11/15/17 Sinda Du

## 2017-11-15 NOTE — Progress Notes (Signed)
  Echocardiogram 2D Echocardiogram has been performed.  Chad Franklin 11/15/2017, 2:11 PM

## 2017-11-15 NOTE — Progress Notes (Signed)
STROKE TEAM PROGRESS NOTE   SUBJECTIVE (INTERVAL HISTORY) His wife and son are at the bedside.  Pt still has mainly left UE weakness. Stated coumadin compliance, but wife concerns other medication interaction with coumadin. Passed swallow. Will resume home meds. He still on afib RVR with HR at 110-130.   OBJECTIVE Vitals:   11/15/17 0600 11/15/17 0630 11/15/17 0700 11/15/17 0730  BP: (!) 149/91 133/83 134/90 136/82  Pulse: (!) 103 99 (!) 108 97  Resp: 17 15 15 19   Temp:    97.9 F (36.6 C)  TempSrc:    Oral  SpO2: 92% 91% 92% 95%  Weight:        CBC:  Recent Labs  Lab 11/14/17 1812 11/14/17 1816  WBC 7.9  --   NEUTROABS 5.1  --   HGB 14.3 15.6  HCT 46.5 46.0  MCV 94.7  --   PLT 222  --     Basic Metabolic Panel:  Recent Labs  Lab 11/14/17 1812 11/14/17 1816  NA 142 143  K 3.9 4.1  CL 104 102  CO2 30  --   GLUCOSE 126* 120*  BUN 14 17  CREATININE 1.16 1.10  CALCIUM 8.8*  --     Lipid Panel:     Component Value Date/Time   CHOL 116 11/15/2017 0342   TRIG 54 11/15/2017 0342   HDL 39 (L) 11/15/2017 0342   CHOLHDL 3.0 11/15/2017 0342   VLDL 11 11/15/2017 0342   LDLCALC 66 11/15/2017 0342   HgbA1c:  Lab Results  Component Value Date   HGBA1C 5.8 (H) 11/15/2017   Urine Drug Screen: No results found for: LABOPIA, COCAINSCRNUR, LABBENZ, AMPHETMU, THCU, LABBARB  Alcohol Level No results found for: ETH  IMAGING  Ct Angio Head W Or Wo Contrast Ct Angio Neck W Or Wo Contrast 11/14/2017   ADDENDUM:  1 cm sclerotic lesion T1 most compatible with bone island though, if there is a history of cancer, consider bone scan.  11/14/2017  IMPRESSION:   CTA NECK:  1. No hemodynamically significant stenosis ICA's.  2. Patent vertebral arteries without dissection.  3. Severe LEFT C3-4 neural foraminal narrowing.   CTA HEAD:  1. No emergent large vessel occlusion or flow-limiting stenosis.  2. Mild intracranial atherosclerosis. Aortic Atherosclerosis (ICD10-I70.0).  Emphysema (ICD10-J43.9).   Ct Head Code Stroke Wo Contrast 11/14/2017  IMPRESSION:  1. Age indeterminate basal ganglia lacunar infarcts superimposed on old lacunar infarcts.  2. Old small LEFT parietoccipital lobe infarct.  3. Moderate chronic small vessel ischemic changes.  4. ASPECTS is 9.   MRI pending  TTE - pending    PHYSICAL EXAM  Temp:  [97.8 F (36.6 C)-98.7 F (37.1 C)] 97.9 F (36.6 C) (08/30 0730) Pulse Rate:  [40-129] 97 (08/30 0730) Resp:  [15-34] 19 (08/30 0730) BP: (115-160)/(75-121) 136/82 (08/30 0730) SpO2:  [91 %-97 %] 95 % (08/30 0730) Weight:  [104.7 kg] 104.7 kg (08/29 1800)  General - Well nourished, well developed, in no apparent distress.  Ophthalmologic - fundi not visualized due to noncooperation.  Cardiovascular - irregularly irregular heart rate and rhythm.  Mental Status -  Level of arousal and orientation to place, and person were intact, however not orientated to time. Language including expression, naming, repetition, comprehension was assessed and found intact.  Cranial Nerves II - XII - II - Visual field intact OU. III, IV, VI - Extraocular movements intact. V - Facial sensation intact bilaterally. VII - subtle left nasolabial fold flattening. VIII -  Hearing & vestibular intact bilaterally. X - Palate elevates symmetrically. XI - Chin turning & shoulder shrug intact bilaterally. XII - Tongue protrusion intact.  Motor Strength - The patient's strength was normal in RUE and RLE, however, LUE 3-/5 proximal and distal, LLE 5-/5 proximal and distal and pronator drift was absent.  Bulk was normal and fasciculations were absent.   Motor Tone - Muscle tone was assessed at the neck and appendages and was normal.  Reflexes - The patient's reflexes were symmetrical in all extremities and he had no pathological reflexes.  Sensory - Light touch, temperature/pinprick were assessed and were symmetrical.    Coordination - The patient had normal  movements in the right hand with no ataxia or dysmetria.  Tremor was absent.  Gait and Station - deferred.   ASSESSMENT/PLAN Chad Franklin is a 83 y.o. male with history of diabetes mellitus, COPD, afib on coumadin and previous stroke in 2012/2013 presenting with left-sided weakness.  He received IV TPA Thursday 11/14/2017 at 1900 hrs.  Stroke: likely right MCA infarct due to afib with subtherapeutic INR  Resultant  Left UE weakness  CT head - Multiple old infarcts - nothing acute.  MRI head - pending  CTA H&N - no significant stenosis  2D Echo - pending  LDL - 66  HgbA1c - 5.8  INR 1.1  VTE prophylaxis - SCDs  Diet - Heart healthy / carb modified with thin liquids.  warfarin daily prior to admission, now on No antithrombotic with 24h of tPA.  Patient counseled to be compliant with his antithrombotic medications  Ongoing aggressive stroke risk factor management  Therapy recommendations:  pending  Disposition:  Pending  Afib with RVR  On coumadin at home, stated compliance  However, INR 1.1 on admission  currently s/p tPA  Consider switch coumadin to DOACs based on MRI result  RVR - metoprolol 5mg  iv stat  Add metoprolol 25mg  bid for rate control  Hypertension  Stable . Permissive hypertension (OK if < 180/105) but gradually normalize in 5-7 days . Long-term BP goal normotensive  Hyperlipidemia  Lipid lowering medication PTA:  Pravachol 10 mg daily  LDL 66, goal < 70  Current lipid lowering medication: Pravachol 10 mg daily  Continue statin at discharge  Diabetes  HgbA1c 5.8, goal < 7.0  Controlled  SSI  CBG monitoring  Other Stroke Risk Factors  Advanced age  Former cigarette smoker - quit   Hx stroke/TIA in 02/2011 - 03/2011 - CT showed left CMA and right BG old infarcts  Other Active Problems  1 cm sclerotic lesion T1 most compatible with bone [facility] day # 1  This patient is critically ill due to stroke s/p  tPA, Afib RVR and at significant risk of neurological worsening, death form recurrent stroke, hemorrhagic conversion, heart failure, seizure. This patient's care requires constant monitoring of vital signs, hemodynamics, respiratory and cardiac monitoring, review of multiple databases, neurological assessment, discussion with family, other specialists and medical decision making of high complexity. I spent 40 minutes of neurocritical care time in the care of this patient. I had long discussion with son and wife and patient at bedside, updated pt current condition, treatment plan and potential prognosis. They expressed understanding and appreciation.   Marvel Plan, MD PhD Stroke Neurology 11/15/2017 10:36 AM     To contact Stroke Continuity provider, please refer to [email or web address]. After hours, contact General Neurology

## 2017-11-16 ENCOUNTER — Encounter (HOSPITAL_COMMUNITY): Payer: Self-pay | Admitting: Cardiology

## 2017-11-16 DIAGNOSIS — I5021 Acute systolic (congestive) heart failure: Secondary | ICD-10-CM

## 2017-11-16 DIAGNOSIS — I6300 Cerebral infarction due to thrombosis of unspecified precerebral artery: Secondary | ICD-10-CM

## 2017-11-16 DIAGNOSIS — I482 Chronic atrial fibrillation: Secondary | ICD-10-CM

## 2017-11-16 LAB — GLUCOSE, CAPILLARY
GLUCOSE-CAPILLARY: 97 mg/dL (ref 70–99)
Glucose-Capillary: 167 mg/dL — ABNORMAL HIGH (ref 70–99)
Glucose-Capillary: 130 mg/dL — ABNORMAL HIGH (ref 70–99)
Glucose-Capillary: 126 mg/dL — ABNORMAL HIGH (ref 70–99)

## 2017-11-16 LAB — CBC
HEMATOCRIT: 43.9 % (ref 39.0–52.0)
HEMOGLOBIN: 13.5 g/dL (ref 13.0–17.0)
MCH: 29.3 pg (ref 26.0–34.0)
MCHC: 30.8 g/dL (ref 30.0–36.0)
MCV: 95.2 fL (ref 78.0–100.0)
Platelets: 178 10*3/uL (ref 150–400)
RBC: 4.61 MIL/uL (ref 4.22–5.81)
RDW: 14.2 % (ref 11.5–15.5)
WBC: 8.6 10*3/uL (ref 4.0–10.5)

## 2017-11-16 LAB — BASIC METABOLIC PANEL
ANION GAP: 11 (ref 5–15)
BUN: 18 mg/dL (ref 8–23)
CALCIUM: 8.6 mg/dL — AB (ref 8.9–10.3)
CO2: 24 mmol/L (ref 22–32)
CREATININE: 1.15 mg/dL (ref 0.61–1.24)
Chloride: 106 mmol/L (ref 98–111)
GFR calc non Af Amer: 58 mL/min — ABNORMAL LOW (ref >60)
Glucose, Bld: 111 mg/dL — ABNORMAL HIGH (ref 70–99)
Potassium: 3.6 mmol/L (ref 3.5–5.1)
SODIUM: 141 mmol/L (ref 135–145)

## 2017-11-16 LAB — TROPONIN I: Troponin I: <0.03 ng/mL (ref <0.03)

## 2017-11-16 MED ORDER — FUROSEMIDE 10 MG/ML IJ SOLN
40.0000 mg | Freq: Once | INTRAMUSCULAR | Status: AC
  Administered 2017-11-16: 40 mg via INTRAVENOUS
  Filled 2017-11-16: qty 4

## 2017-11-16 MED ORDER — ASPIRIN EC 81 MG PO TBEC
81.0000 mg | DELAYED_RELEASE_TABLET | Freq: Every day | ORAL | Status: DC
  Administered 2017-11-16 – 2017-11-18 (×3): 81 mg via ORAL
  Filled 2017-11-16 (×3): qty 1

## 2017-11-16 MED ORDER — CARVEDILOL 6.25 MG PO TABS
6.2500 mg | ORAL_TABLET | Freq: Two times a day (BID) | ORAL | Status: DC
  Administered 2017-11-16 – 2017-11-19 (×6): 6.25 mg via ORAL
  Filled 2017-11-16: qty 2
  Filled 2017-11-16 (×2): qty 1
  Filled 2017-11-16 (×3): qty 2

## 2017-11-16 MED ORDER — FUROSEMIDE 10 MG/ML IJ SOLN: 40.0000 mg | Freq: Once | INTRAMUSCULAR | Status: DC

## 2017-11-16 MED ORDER — LOSARTAN POTASSIUM 25 MG PO TABS
25.0000 mg | ORAL_TABLET | Freq: Every day | ORAL | Status: DC
  Administered 2017-11-16 – 2017-11-20 (×5): 25 mg via ORAL
  Filled 2017-11-16 (×5): qty 1

## 2017-11-16 NOTE — Progress Notes (Signed)
Rehab Admissions Coordinator Note:  Per PT recommendation, Patient was screened by Nanine Means for appropriateness for an Inpatient Acute Rehab Consult.  At this time, we are recommending Inpatient Rehab consult. AC will contact MD regarding IP Rehab Consult Order.   Nanine Means 11/16/2017, 1:57 PM  I can be reached at [phone].

## 2017-11-16 NOTE — Consult Note (Addendum)
Cardiology Consultation:   Patient ID: Chad Franklin; [id number]; 06-18-1934   Admit date: 11/14/2017 Date of Consult: 11/16/2017  Primary Care Provider: No primary care provider on file. Primary Cardiologist: New to [facility]   Patient Profile:   Chad Franklin is a 83 y.o. male with a hx of DM2, COPD, permanent atrial fibrillation on chronic Coumadin and prior CVA 2012/2013 who is being seen today for the evaluation of atrial fibrillation with newly decreased LV function and the need for DOAC therapy at the request of Dr. Pearlean Brownie.   History of Present Illness:   Chad Franklin is an 83 year old male with a history stated above who presented to [facility] on 11/14/2017 with sudden onset of left-sided weakness which began around 3 PM on day of admission, witnessed by his wife.  Unfortunately, they did not call EMS for transport and arrived to [facility] by personal vehicle and stroke team was alerted by ED triage. CT head showed old infarcts however no acute abnormality. TPA was administered per neurology  after waiting for INR which returned as 1.1.  Patient reports compliance with Coumadin and took a dose on day of admission. His AF is managed by his PCP provider and wife reports that he has not seen a Cardiologist in approximately 6 years. He denies chest pain, palpitations, orthopnea, PND, dizziness or syncope. Daughter at bedside states that he has complained of a persistent cough for the last several weeks which has been associated with significant fatigue. Chad Franklin states that he has noticed increased abdominal swelling along with the cough which seems to be his biggest complaint today.   In the ED, EKG revealed atrial fibrillation with RVR with an HR of 135.  Troponin is negative x2 at<0.03, <0.03. He was treated for an acute stroke with tPA, along with risk factor modification strategies. CXR with evidence of pulmonary vascular congestion   Cardiology has been asked to follow for new systolic HF as well as the  need for DOAC therapy in place of his Coumadin.   Past Medical History:  Diagnosis Date  . Bilateral swelling of feet   . COPD (chronic obstructive pulmonary disease) (HCC)   . Depression   . Diabetes mellitus   . GERD (gastroesophageal reflux disease)   . Palpitations   . Stroke [facility])    2    The histories are not reviewed yet. Please review them in the "History" navigator section and refresh this SmartLink.   Prior to Admission medications   Medication Sig Start Date End Date Taking? Authorizing Provider  acetaminophen (TYLENOL) 500 MG tablet Take 500 mg by mouth 2 (two) times daily.   Yes [provider]  furosemide (LASIX) 20 MG tablet Take 40 mg by mouth daily.   Yes [provider]  losartan-hydrochlorothiazide (HYZAAR) 100-12.5 MG tablet Take 1 tablet by mouth daily.   Yes [provider]  Multiple Vitamin (MULTIVITAMIN WITH MINERALS) TABS tablet Take 1 tablet by mouth daily.   Yes [provider]  PARoxetine (PAXIL) 20 MG tablet Take 20 mg by mouth daily.   Yes [provider]  pravastatin (PRAVACHOL) 10 MG tablet Take 10 mg by mouth daily.   Yes [provider]  verapamil (CALAN-SR) 240 MG CR tablet Take 240 mg by mouth daily.   Yes [provider]  warfarin (COUMADIN) 5 MG tablet Take 2.5-5 mg by mouth See admin instructions. Take 1/2 tablet (2.5mg ) Mondays and 1 tablet (5mg ) every other day of the week.  Yes [provider]    Inpatient Medications: Scheduled Meds: .  stroke: mapping our early stages of recovery book   Does not apply Once  . aspirin EC  81 mg Oral Daily  . bisacodyl  10 mg Rectal Once  . carvedilol  6.25 mg Oral BID WC  . furosemide  40 mg Intravenous Once  . insulin aspart  0-15 Units Subcutaneous TID WC  . insulin aspart  0-5 Units Subcutaneous QHS  . multivitamin with minerals  1 tablet Oral Daily  . PARoxetine  20 mg Oral Daily  . pravastatin  10 mg Oral Daily   Continuous  Infusions:  PRN Meds: acetaminophen **OR** acetaminophen (TYLENOL) oral liquid 160 mg/5 mL **OR** acetaminophen, ondansetron (ZOFRAN) IV, senna-docusate  Allergies:    Allergies  Allergen Reactions  . Naproxen Sodium Anaphylaxis, Hives, Shortness Of Breath and Swelling    Social History:   Social History   Socioeconomic History  . Marital status: Married    Spouse name: Not on file  . Number of children: Not on file  . Years of education: Not on file  . Highest education level: Not on file  Occupational History  . Not on file  Social Needs  . Financial resource strain: Not on file  . Food insecurity:    Worry: Not on file    Inability: Not on file  . Transportation needs:    Medical: Not on file    Non-medical: Not on file  Tobacco Use  . Smoking status: Former Smoker  Substance and Sexual Activity  . Alcohol use: No    Comment: past drinker  . Drug use: Not on file  . Sexual activity: Not on file  Lifestyle  . Physical activity:    Days per week: Not on file    Minutes per session: Not on file  . Stress: Not on file  Relationships  . Social connections:    Talks on phone: Not on file    Gets together: Not on file    Attends religious service: Not on file    Active member of club or organization: Not on file    Attends meetings of clubs or organizations: Not on file    Relationship status: Not on file  . Intimate partner violence:    Fear of current or ex partner: Not on file    Emotionally abused: Not on file    Physically abused: Not on file    Forced sexual activity: Not on file  Other Topics Concern  . Not on file  Social History Narrative  . Not on file    Family History:   History reviewed. No pertinent family history. Family Status:  No family status information on file.    ROS:  Please see the history of present illness.  All other ROS reviewed and negative.     Physical Exam/Data:   Vitals:   11/16/17 1204 11/16/17 1300 11/16/17 1500  11/16/17 1600  BP: 105/77 112/72 (!) 169/154 115/76  Pulse:  93 95 80  Resp:  19 (!) 23 (!) 26  Temp: 97.9 F (36.6 C)     TempSrc: Oral     SpO2:  94% 93% 93%  Weight:        Intake/Output Summary (Last 24 hours) at 11/16/2017 1608 Last data filed at 11/16/2017 1518 Gross per 24 hour  Intake 1141.82 ml  Output 450 ml  Net 691.82 ml   Filed Weights   11/14/17 1800  Weight: 104.7  kg   There is no height or weight on file to calculate BMI.   General: Elderly,  NAD Skin: Warm, dry, intact  Head: Normocephalic, atraumatic, clear, moist mucus membranes. Neck: Negative for carotid bruits. No JVD Lungs:Clear to ausculation bilaterally. No wheezes, rales, or rhonchi. Breathing is unlabored. Cardiovascular: Irregularly irregular with S1 S2. + murmur. No rubs, gallops, or LV heave appreciated. Abdomen: Soft, non-tender, non-distended with normoactive bowel sounds. No obvious abdominal masses. MSK: Strength and tone appear normal for age. 5/5 in all extremities Extremities: No edema. No clubbing or cyanosis. DP/Chad Franklin pulses 2+ bilaterally Neuro: Alert and oriented. No focal deficits. No facial asymmetry. MAE spontaneously. Psych: Responds to questions appropriately with normal affect.     EKG:  The EKG was personally reviewed and demonstrates: 11/15/17 Atrial fibrillation with no acute ischemics changes  Telemetry:  Telemetry was personally reviewed and demonstrates: 11/16/17 Atrial fibrillation HR 90's  Relevant CV Studies:  ECHO: 11/15/17; Study Conclusions  - Left ventricle: The cavity size was mildly dilated. There was   mild focal basal hypertrophy of the septum. Systolic function was   moderately reduced. The estimated ejection fraction was in the   range of 35% to 40%. - Regional wall motion abnormality: Akinesis of the mid anterior   and basal-mid anteroseptal myocardium; hypokinesis of the apical   anterior, mid inferoseptal, apical inferior, apical septal,   apical  lateral, and apical myocardium. - Aortic valve: There was trivial regurgitation. - Mitral valve: Calcified annulus. Mildly thickened leaflets .   There was moderate regurgitation. - Left atrium: The atrium was massively dilated. - Right ventricle: The cavity size was moderately dilated. Wall   thickness was normal. Systolic function was mildly reduced. - Right atrium: The atrium was moderately dilated. - Atrial septum: No defect or patent foramen ovale was identified. - Tricuspid valve: There was mild regurgitation. - Pulmonic valve: There was mild regurgitation. - Pulmonary arteries: Systolic pressure was moderately increased.   PA peak pressure: 44 mm Hg (S).  Impressions:  - Reduced LVEF with wall motion abnormalities across the   anteroseptal, anterior, inferoseptal, and apical myocardium.   Consider multivessel CAD vs. takotsubo. Massively dilated LA with   moderate MR. Elevated PASP of 44 mmHg.   CATH: none   Laboratory Data:  Chemistry Recent Labs  Lab 11/14/17 1812 11/14/17 1816 11/16/17 0052  NA 142 143 141  K 3.9 4.1 3.6  CL 104 102 106  CO2 30  --  24  GLUCOSE 126* 120* 111*  BUN 14 17 18   CREATININE 1.16 1.10 1.15  CALCIUM 8.8*  --  8.6*  GFRNONAA 57*  --  58*  GFRAA >60  --  >60  ANIONGAP 8  --  11    Total Protein  Date Value Ref Range Status  11/14/2017 7.4 6.5 - 8.1 g/dL Final   Albumin  Date Value Ref Range Status  11/14/2017 3.7 3.5 - 5.0 g/dL Final   AST  Date Value Ref Range Status  11/14/2017 21 15 - 41 U/L Final   ALT  Date Value Ref Range Status  11/14/2017 15 0 - 44 U/L Final   Alkaline Phosphatase  Date Value Ref Range Status  11/14/2017 74 38 - 126 U/L Final   Total Bilirubin  Date Value Ref Range Status  11/14/2017 1.0 0.3 - 1.2 mg/dL Final   Hematology Recent Labs  Lab 11/14/17 1812 11/14/17 1816 11/16/17 0052  WBC 7.9  --  8.6  RBC 4.91  --  4.61  HGB 14.3 15.6 13.5  HCT 46.5 46.0 43.9  MCV 94.7  --  95.2    MCH 29.1  --  29.3  MCHC 30.8  --  30.8  RDW 13.9  --  14.2  PLT 222  --  178   Cardiac Enzymes Recent Labs  Lab 11/15/17 1329 11/15/17 1854 11/16/17 0052  TROPONINI <0.03 <0.03 <0.03    Recent Labs  Lab 11/14/17 1814  TROPIPOC 0.01    BNPNo results for input(s): BNP, PROBNP in the last 168 hours.  DDimer No results for input(s): DDIMER in the last 168 hours. TSH: No results found for: TSH Lipids: Lab Results  Component Value Date   CHOL 116 11/15/2017   HDL 39 (L) 11/15/2017   LDLCALC 66 11/15/2017   TRIG 54 11/15/2017   CHOLHDL 3.0 11/15/2017   HgbA1c: Lab Results  Component Value Date   HGBA1C 5.8 (H) 11/15/2017    Radiology/Studies:  Ct Angio Head W Or Wo Contrast  Addendum Date: 11/14/2017   ADDENDUM REPORT: 11/14/2017 19:53 ADDENDUM: 1 cm sclerotic lesion T1 most compatible with bone island though, if there is a history of cancer, consider bone scan. Electronically Signed   By: Awilda Metro M.D.   On: 11/14/2017 19:53   Result Date: 11/14/2017 CLINICAL DATA:  LEFT extremity weakness. History of diabetes and stroke. EXAM: CT ANGIOGRAPHY HEAD AND NECK TECHNIQUE: Multidetector CT imaging of the head and neck was performed using the standard protocol during bolus administration of intravenous contrast. Multiplanar CT image reconstructions and MIPs were obtained to evaluate the vascular anatomy. Carotid stenosis measurements (when applicable) are obtained utilizing NASCET criteria, using the distal internal carotid diameter as the denominator. CONTRAST:  50mL ISOVUE-370 IOPAMIDOL (ISOVUE-370) INJECTION 76% COMPARISON:  CT HEAD November 14, 2017. MRA head and neck January 12, 2005 FINDINGS: CTA NECK FINDINGS: AORTIC ARCH: Normal appearance of the thoracic arch, normal branch pattern. Mild calcific atherosclerosis the origins of the innominate, left Common carotid artery and subclavian artery are widely patent. RIGHT CAROTID SYSTEM: Common carotid artery is patent.  Minimal calcific atherosclerosis of the carotid bifurcation without hemodynamically significant stenosis by NASCET criteria. Normal appearance of the internal carotid artery. LEFT CAROTID SYSTEM: Common carotid artery is patent. Moderate intimal thickening and trace calcific atherosclerosis of the carotid bifurcation without hemodynamically significant stenosis by NASCET criteria. Normal appearance of the internal carotid artery. VERTEBRAL ARTERIES:Left vertebral artery is dominant. Normal appearance of the vertebral arteries, widely patent. SKELETON: No acute osseous process though bone windows have not been submitted. Patient is edentulous. 10 mm geographic sclerotic lesion T1. Degenerative change of the cervical spine resulting in severe LEFT and moderate RIGHT C3-4 neural foraminal narrowing. OTHER NECK: Soft tissues of the neck are nonacute though, not tailored for evaluation. Subcentimeter intraparotid probable lymph nodes. UPPER CHEST: Included lung apices are clear. Mild centrilobular emphysema. LEFT upper lobe calcified granuloma. No superior mediastinal lymphadenopathy. CTA HEAD FINDINGS: ANTERIOR CIRCULATION: Patent cervical internal carotid arteries, petrous, cavernous and supra clinoid internal carotid arteries. Patent anterior and middle cerebral arteries, mild luminal irregularity compatible with atherosclerosis. No large vessel occlusion, significant stenosis, contrast extravasation or aneurysm. POSTERIOR CIRCULATION: Patent vertebral arteries, vertebrobasilar junction and basilar artery, as well as main branch vessels. Patent posterior cerebral arteries, mild luminal irregularity compatible with atherosclerosis. Robust RIGHT posterior communicating artery present. No large vessel occlusion, significant stenosis, contrast extravasation or aneurysm. VENOUS SINUSES: Major dural venous sinuses are patent though not tailored for evaluation on this angiographic examination. ANATOMIC  VARIANTS: None.  DELAYED PHASE: Not performed. MIP images reviewed. IMPRESSION: CTA NECK: 1. No hemodynamically significant stenosis ICA's. 2. Patent vertebral arteries without dissection. 3. Severe LEFT C3-4 neural foraminal narrowing. CTA HEAD: 1. No emergent large vessel occlusion or flow-limiting stenosis. 2. Mild intracranial atherosclerosis. Aortic Atherosclerosis (ICD10-I70.0). Emphysema (ICD10-J43.9). Electronically Signed: By: Awilda Metro M.D. On: 11/14/2017 19:42   Ct Head Wo Contrast  Result Date: 11/15/2017 CLINICAL DATA:  Prior stroke, new onset vomiting. EXAM: CT HEAD WITHOUT CONTRAST TECHNIQUE: Contiguous axial images were obtained from the base of the skull through the vertex without intravenous contrast. COMPARISON:  CT 11/14/2017 FINDINGS: Brain: No acute intraparenchymal hemorrhage, intra-axial mass or midline shift. No large vascular territory infarct. Chronic stable involutional changes of the brain with chronic appearing small vessel ischemic disease of periventricular subcortical white matter. Small focus of left occipital encephalomalacia. Chronic bilateral basal ganglial lacunar infarcts. No abnormal extra axial fluid collections. Midline fourth ventricle and basal cisterns. Vascular: No hyperdense vessel sign. Skull: No acute skull fracture or suspicious osseous lesions. Sinuses/Orbits: Chronic right maxillary sinusitis with mucous retention cyst of the left maxillary sinus. Mastoids are clear. Included ocular globes and orbital contents are stable and unremarkable. Other: None IMPRESSION: Chronic appearing small vessel ischemic disease and bilateral basal ganglial lacunar infarcts. Encephalomalacia left posterior parietal lobe from remote infarct. No significant change from 1 day prior. Electronically Signed   By: Tollie Eth M.D.   On: 11/15/2017 16:39   Ct Angio Neck W Or Wo Contrast  Addendum Date: 11/14/2017   ADDENDUM REPORT: 11/14/2017 19:53 ADDENDUM: 1 cm sclerotic lesion T1 most  compatible with bone island though, if there is a history of cancer, consider bone scan. Electronically Signed   By: Awilda Metro M.D.   On: 11/14/2017 19:53   Result Date: 11/14/2017 CLINICAL DATA:  LEFT extremity weakness. History of diabetes and stroke. EXAM: CT ANGIOGRAPHY HEAD AND NECK TECHNIQUE: Multidetector CT imaging of the head and neck was performed using the standard protocol during bolus administration of intravenous contrast. Multiplanar CT image reconstructions and MIPs were obtained to evaluate the vascular anatomy. Carotid stenosis measurements (when applicable) are obtained utilizing NASCET criteria, using the distal internal carotid diameter as the denominator. CONTRAST:  50mL ISOVUE-370 IOPAMIDOL (ISOVUE-370) INJECTION 76% COMPARISON:  CT HEAD November 14, 2017. MRA head and neck January 12, 2005 FINDINGS: CTA NECK FINDINGS: AORTIC ARCH: Normal appearance of the thoracic arch, normal branch pattern. Mild calcific atherosclerosis the origins of the innominate, left Common carotid artery and subclavian artery are widely patent. RIGHT CAROTID SYSTEM: Common carotid artery is patent. Minimal calcific atherosclerosis of the carotid bifurcation without hemodynamically significant stenosis by NASCET criteria. Normal appearance of the internal carotid artery. LEFT CAROTID SYSTEM: Common carotid artery is patent. Moderate intimal thickening and trace calcific atherosclerosis of the carotid bifurcation without hemodynamically significant stenosis by NASCET criteria. Normal appearance of the internal carotid artery. VERTEBRAL ARTERIES:Left vertebral artery is dominant. Normal appearance of the vertebral arteries, widely patent. SKELETON: No acute osseous process though bone windows have not been submitted. Patient is edentulous. 10 mm geographic sclerotic lesion T1. Degenerative change of the cervical spine resulting in severe LEFT and moderate RIGHT C3-4 neural foraminal narrowing. OTHER NECK: Soft  tissues of the neck are nonacute though, not tailored for evaluation. Subcentimeter intraparotid probable lymph nodes. UPPER CHEST: Included lung apices are clear. Mild centrilobular emphysema. LEFT upper lobe calcified granuloma. No superior mediastinal lymphadenopathy. CTA HEAD FINDINGS: ANTERIOR CIRCULATION: Patent cervical internal carotid arteries,  petrous, cavernous and supra clinoid internal carotid arteries. Patent anterior and middle cerebral arteries, mild luminal irregularity compatible with atherosclerosis. No large vessel occlusion, significant stenosis, contrast extravasation or aneurysm. POSTERIOR CIRCULATION: Patent vertebral arteries, vertebrobasilar junction and basilar artery, as well as main branch vessels. Patent posterior cerebral arteries, mild luminal irregularity compatible with atherosclerosis. Robust RIGHT posterior communicating artery present. No large vessel occlusion, significant stenosis, contrast extravasation or aneurysm. VENOUS SINUSES: Major dural venous sinuses are patent though not tailored for evaluation on this angiographic examination. ANATOMIC VARIANTS: None. DELAYED PHASE: Not performed. MIP images reviewed. IMPRESSION: CTA NECK: 1. No hemodynamically significant stenosis ICA's. 2. Patent vertebral arteries without dissection. 3. Severe LEFT C3-4 neural foraminal narrowing. CTA HEAD: 1. No emergent large vessel occlusion or flow-limiting stenosis. 2. Mild intracranial atherosclerosis. Aortic Atherosclerosis (ICD10-I70.0). Emphysema (ICD10-J43.9). Electronically Signed: By: Awilda Metro M.D. On: 11/14/2017 19:42   Dg Chest Port 1 View  Result Date: 11/15/2017 CLINICAL DATA:  Chest tightness earlier today, nausea, vomiting, history diabetes mellitus, stroke, COPD, former smoker EXAM: PORTABLE CHEST 1 VIEW COMPARISON:  Portable exam 1558 hours compared to 08/15/2016 FINDINGS: Enlargement of cardiac silhouette with pulmonary vascular congestion. Mediastinal contours  normal. Minimal chronic accentuation of interstitial markings which could reflect minimal chronic pulmonary edema. No segmental consolidation, pleural effusion or pneumothorax. Bones demineralized. IMPRESSION: Enlargement of cardiac silhouette with pulmonary vascular congestion. Chronic accentuation of interstitial markings question minimal chronic failure. Electronically Signed   By: Ulyses Southward M.D.   On: 11/15/2017 16:14   Ct Head Code Stroke Wo Contrast  Result Date: 11/14/2017 CLINICAL DATA:  Code stroke. LEFT extremity weakness. Last seen normal at 14 30 hours. History of diabetes, stroke. EXAM: CT HEAD WITHOUT CONTRAST TECHNIQUE: Contiguous axial images were obtained from the base of the skull through the vertex without intravenous contrast. COMPARISON:  MRI head January 12, 2005 FINDINGS: BRAIN: No intraparenchymal hemorrhage, mass effect nor midline shift. The ventricles and sulci are normal for age. Multiple chronic appearing bilateral basal ganglia with additional age indeterminate basal ganglia lacunar infarcts. Small LEFT prior occipital lobe encephalomalacia. Patchy supratentorial white matter hypodensities within normal range for patient's age, though non-specific are most compatible with chronic small vessel ischemic disease. No acute large vascular territory infarcts. No abnormal extra-axial fluid collections. Basal cisterns are patent. VASCULAR: Trace calcific atherosclerosis of the carotid siphons. No hyperdense vessel. SKULL: No skull fracture. No significant scalp soft tissue swelling. SINUSES/ORBITS: Chronic RIGHT maxillary sinusitis. LEFT maxillary mucosal retention cyst. Mastoid air cells are well aerated.The included ocular globes and orbital contents are non-suspicious. OTHER: Patient is edentulous. ASPECTS [facility] Stroke Program Early CT Score) - Ganglionic level infarction (caudate, lentiform nuclei, internal capsule, insula, M1-M3 cortex): 6 - Supraganglionic infarction (M4-M6  cortex): 3 Total score (0-10 with 10 being normal): 9 IMPRESSION: 1. Age indeterminate basal ganglia lacunar infarcts superimposed on old lacunar infarcts. 2. Old small LEFT parietoccipital lobe infarct. 3. Moderate chronic small vessel ischemic changes. 4. ASPECTS is 9. 5. Critical Value/emergent results text paged to Dr.AROOR, Neurology via AMION secure system on 11/14/2017 at 6:29 pm, including interpreting physician's phone number. Electronically Signed   By: Awilda Metro M.D.   On: 11/14/2017 18:29    Assessment and Plan:   1.  Permanent atrial fibrillation on Coumadin: -Patient presented with left-sided weakness on 11/14/2017, found to have right MCA infarct likely secondary to subtherapeutic INR.  He is on chronic Coumadin therapy for permanent atrial fibrillation and found to have an INR of 1.18 on  admission, although reports Coumadin compliance -Per neurology, consider transitioning to DOPC based on MRI results>>currently s/p tPA administration  -Metoprolol 25mg  PO BID added for greater rate control  -Diltiazem discontinued secondary to new decreased LV function -Will add IV Lasix per MD  -Once cleared from neurology, will start on DOAC for anticoagulation  -CHA2DS2VASc = at least 7  2.  Right MCA infarct likely secondary to subtherapeutic INR: -Patient presented with left-sided weakness found to have right MCA infarct secondary to subtherapeutic INR level. -CT head on admission showed multiple old infarcts however nothing acute  3. New acute systolic CHF: -Patient presented with left-sided weakness found to have right MCA infarct secondary to subtherapeutic INR level. -Echocardiogram performed 32,019 with EF of 35 to 40% with regional wall motion abnormality including akinesis of the mid anterior and basal mid anterior septal myocardium, hypokinesis of the apical anterior and mid inferior septal, apical inferior, apical septal, apical lateral and apical myocardium>>> once okay to  proceed per neurology, will need ischemic work-up with consideration for multivessel CAD versus Takotsubo -Diltiazem discontinued secondary to new decreased LV function  4.  Hypertension: -Stable, 115/76>169/154>112/72>107/77 -Permissive hypertension per neurology with long-term BP goal, normotensive  5.  HLD: -Stable, LDL 66 with a goal of less than 70 -Continue current regimen  6.  DM2: -Hemoglobin A1c, 5.8 this admission -SSI for glucose control while inpatient  For questions or updates, please contact [facility] Please consult [email or web address] for contact info under Cardiology/STEMI.   Raliegh Ip NP-C [facility] Pager: [phone] 11/16/2017 4:08 PM   Patient seen and examined with the above-signed Advanced Practice Provider and/or Housestaff. I personally reviewed laboratory data, imaging studies and relevant notes. I independently examined the patient and formulated the important aspects of the plan. I have edited the note to reflect any of my changes or salient points. I have personally discussed the plan with the patient and/or family.  83 y/o with permanent AF (not followed by a cardiologist), DM2, HTN and HL admitted with acute CVA in setting of subtherapeutic INR for which he received t-PA. Echo reveals EF 30-35% with regional wall motion abnormalities. Denies CP but has had recent cough and was unable to lie flat in MRI. Currently getting IVF  On exam  Elderly male in NAD JVP to jaw Cor: IRR Lungs basilar crackles Ab obese NT Ext no edema.   ECG notable only for AF . No ST-T abnormalities or q waves. Troponins normal  Echo reviewed personally EF 30-35% with RWMA concerning for ischemic CM vs stress-induced CM which can be seen with acute CVA. On exam has mild volume overload.   Agree with plan to switch to Eliquis. Give recent stroke and lack of CP would prefer to defer invasive w/u (cath) at this time and will proceed with Myoview to assess for  high-risk features. Will stop IVF and give one dose IV lasix. Stop verapamil due to low EF. Start losartan - can switch to [facility] when BP more stable.  Switch lopressor to carvedilol. Start statin .We will follow.   Arvilla Meres, MD  4:17 PM

## 2017-11-16 NOTE — Evaluation (Signed)
Speech Language Pathology Evaluation Patient Details Name: Chad Franklin MRN: [id number] DOB: January 02, 1935 Today's Date: 11/16/2017 Time: 4098-1191 SLP Time Calculation (min) (ACUTE ONLY): 21 min  Problem List:  Patient Active Problem List   Diagnosis Date Noted  . Stroke (cerebrum) (HCC) 11/14/2017  . Diabetes mellitus 10/26/2010  . GERD (gastroesophageal reflux disease) 10/26/2010  . COPD (chronic obstructive pulmonary disease) (HCC) 10/26/2010  . Depression 10/26/2010  . Bilateral swelling of feet 10/26/2010  . Palpitations 10/26/2010   Past Medical History:  Past Medical History:  Diagnosis Date  . Bilateral swelling of feet   . COPD (chronic obstructive pulmonary disease)   . Depression   . Diabetes mellitus   . GERD (gastroesophageal reflux disease)   . Palpitations   . Stroke    2    HPI:  [name] is a 83 y.o. male with history of diabetes mellitus, COPD, afib on coumadin and previous stroke in 2012/2013 presenting with left-sided weakness, s/p TPA. CT head 11/14/17: age indeterminate basal ganglia lacunar infarcts superimposed on old lacunar infarcts, old small LEFT parietoccipital lobe infarct. MRI pending.   Assessment / Plan / Recommendation Clinical Impression   Patient presents with significant cognitive communication impairment, with deficits seen today in selective attention, recall, working memory, problem solving, reasoning and awareness. Pt scored 9/30 on MOCA- Basic, indicating severe impairment even when adjusted for level of education (26 and above is considered normal). Pt was easily distracted by sounds in the room as well as by objects in his immediate view. Pt initially denied deficits, but after testing he and daughter both agreed that he is having more difficulty than they would have expected. In supplemental functional math and verbal reasoning tasks, pt with 0% accuracy despite max cues. Speech is fluent and clear without notable dysarthria. I  recommend skilled ST to address cognitive impairments in order to maximize cognition, decrease caregiver burden, improve safety awareness and quality of life. Will follow acutely.    SLP Assessment  SLP Recommendation/Assessment: Patient needs continued Speech Lanaguage Pathology Services SLP Visit Diagnosis: Cognitive communication deficit (R41.841);Attention and concentration deficit Attention and concentration deficit following: Cerebral infarction    Follow Up Recommendations       Frequency and Duration min 2x/week  1 week      SLP Evaluation Cognition  Overall Cognitive Status: Impaired/Different from baseline Arousal/Alertness: Awake/alert Orientation Level: Oriented to person;Oriented to place;Oriented to situation;Disoriented to time Attention: Selective Selective Attention: Impaired Selective Attention Impairment: Verbal basic;Functional basic(selective digit repetition impaired, distracted by sounds) Memory: Impaired Memory Impairment: Storage deficit;Decreased recall of new information;Decreased short term memory Decreased Short Term Memory: Verbal basic Awareness: Impaired Awareness Impairment: Intellectual impairment Problem Solving: Impaired Problem Solving Impairment: Verbal basic Executive Function: Reasoning Reasoning: Impaired Reasoning Impairment: Verbal basic Safety/Judgment: Impaired Comments: decreased awareness of deficits       Comprehension  Auditory Comprehension Overall Auditory Comprehension: Appears within functional limits for tasks assessed Yes/No Questions: Within Functional Limits Commands: Within Functional Limits(repetition for more complex commands) Conversation: Simple Interfering Components: Attention;Working Optician, dispensing: Within [employer] Reading Comprehension Reading Status: Not tested(pt does not read)    Expression Expression Primary Mode of Expression:  Verbal Verbal Expression Overall Verbal Expression: Appears within functional limits for tasks assessed Initiation: No impairment Automatic Speech: Name;Social Response Level of Generative/Spontaneous Verbalization: Conversation Repetition: Impaired Level of Impairment: Word level Pragmatics: No impairment Interfering Components: Attention Written Expression Dominant Hand: Right Written Expression: Not tested   Oral /  Motor  Oral Motor/Sensory Function Overall Oral Motor/Sensory Function: Mild impairment Facial ROM: Within Functional Limits Facial Symmetry: Abnormal symmetry left(minimal) Facial Strength: Within Functional Limits Facial Sensation: Within Functional Limits Lingual ROM: Within Functional Limits Lingual Symmetry: Within Functional Limits Lingual Strength: Within Functional Limits Velum: Within Functional Limits Mandible: Within Functional Limits Motor Speech Overall Motor Speech: Appears within functional limits for tasks assessed   GO                   Rondel Baton, MS, CCC-SLP Speech-Language Pathologist Acute Rehabilitation Services Pager: [phone] Office: [phone]  Arlana Lindau 11/16/2017, 3:04 PM

## 2017-11-16 NOTE — Evaluation (Signed)
Physical Therapy Evaluation Patient Details Name: Chad Franklin MRN: [id number] DOB: 14-May-1934 Today's Date: 11/16/2017   History of Present Illness  83 yo admitted with left weakness s/p tPA with CT demonstrating Old small LEFT parietoccipital lobe infarct and MRI pending. PMhx: Afib, DM, COPD, stroke (patient and wife denies this) on Coumadin   Clinical Impression  PT very pleasant, joking and very willing to mobilize. Upon sitting asked pt if he felt like he needed any AD with gait and pt stated no. With standing pt with left lean, unsteady gait and mod assist for stability and balance. Pt with LLE testing wNL for sensation, proprioception, coordination and strength but during functional activity demonstrates decreased stability with knee partial buckle and scissoring. PT with impaired LUE strength and function as well as gait and balance deficits who will benefit from acute therapy to maximize mobility, function and safety to decrease burden of care.  BP 105/77 after activity    Follow Up Recommendations CIR;Supervision/Assistance - 24 hour    Equipment Recommendations  3in1 (PT);Other (comment)(walking AD TBD)    Recommendations for Other Services OT consult     Precautions / Restrictions Precautions Precautions: Fall Precaution Comments: fall immediately prior to admission Restrictions Weight Bearing Restrictions: No      Mobility  Bed Mobility Overal bed mobility: Needs Assistance Bed Mobility: Supine to Sit     Supine to sit: Min guard;HOB elevated     General bed mobility comments: pt with increased time and struggle to pivot to left and reaching with RUE for assist  Transfers Overall transfer level: Needs assistance   Transfers: Sit to/from Stand Sit to Stand: Min assist         General transfer comment: min assist to stand from bed with cues for safety and reaching for surface to sit, as well as cues to back fully to  surface  Ambulation/Gait Ambulation/Gait assistance: Mod assist;+2 physical assistance Gait Distance (Feet): 35 Feet Assistive device: Rolling walker (2 wheeled) Gait Pattern/deviations: Step-to pattern;Narrow base of support;Decreased weight shift to left   Gait velocity interpretation: <1.8 ft/sec, indicate of risk for recurrent falls General Gait Details: pt leaning left with consistent loss of grip on LUE on RW with assist to maintain hand on RW with lack of proprioception. pt with periods of partial left knee buckling with flexed trunk. Mod assist +2 to control, trunk and balance as well as lines. Pt unaware of deficits and need for assist.   Stairs            Wheelchair Mobility    Modified Rankin (Stroke Patients Only) Modified Rankin (Stroke Patients Only) Pre-Morbid Rankin Score: No symptoms Modified Rankin: Moderately severe disability     Balance Overall balance assessment: Needs assistance   Sitting balance-Leahy Scale: Good       Standing balance-Leahy Scale: Poor                               Pertinent Vitals/Pain Pain Assessment: 0-10 Pain Score: 2  Pain Location: chest Pain Descriptors / Indicators: Aching Pain Intervention(s): Limited activity within patient's tolerance    Home Living Family/patient expects to be discharged to:: Private residence Living Arrangements: Spouse/significant other Available Help at Discharge: Family;Available 24 hours/day Type of Home: House Home Access: Stairs to enter   [employer] of Steps: 3 Home Layout: One level Home Equipment: None      Prior Function Level of Independence: Independent  Hand Dominance   Dominant Hand: Right    Extremity/Trunk Assessment        Lower Extremity Assessment Lower Extremity Assessment: LLE deficits/detail RLE Deficits / Details: WNL LLE Deficits / Details: hip flexion 4/5, hip abduction/ADD 5/5, knee extension 5/5. Pt with  functional strength and sensation with testing but with mobility lack of coordination and control of LLE noted LLE Sensation: WNL LLE Coordination: WNL    Cervical / Trunk Assessment Cervical / Trunk Assessment: Other exceptions Cervical / Trunk Exceptions: rounded shoulders  Communication   Communication: No difficulties  Cognition Arousal/Alertness: Awake/alert Behavior During Therapy: WFL for tasks assessed/performed Overall Cognitive Status: Impaired/Different from baseline Area of Impairment: Safety/judgement                         Safety/Judgement: Decreased awareness of deficits;Decreased awareness of safety            General Comments      Exercises     Assessment/Plan    PT Assessment Patient needs continued PT services  PT Problem List Decreased strength;Decreased mobility;Decreased safety awareness;Decreased activity tolerance;Decreased balance;Decreased knowledge of use of DME;Impaired sensation       PT Treatment Interventions Gait training;Therapeutic exercise;Patient/family education;Stair training;Functional mobility training;Balance training;Neuromuscular re-education;DME instruction;Therapeutic activities    PT Goals (Current goals can be found in the Care Plan section)  Acute Rehab PT Goals Patient Stated Goal: return home and haul junk PT Goal Formulation: With patient Time For Goal Achievement: 11/30/17 Potential to Achieve Goals: Good    Frequency Min 4X/week   Barriers to discharge        Co-evaluation               AM-PAC PT "6 Clicks" Daily Activity  Outcome Measure Difficulty turning over in bed (including adjusting bedclothes, sheets and blankets)?: A Lot Difficulty moving from lying on back to sitting on the side of the bed? : A Little Difficulty sitting down on and standing up from a chair with arms (e.g., wheelchair, bedside commode, etc,.)?: A Little Help needed moving to and from a bed to chair (including a  wheelchair)?: A Lot Help needed walking in hospital room?: A Lot Help needed climbing 3-5 steps with a railing? : A Lot 6 Click Score: 14    End of Session Equipment Utilized During Treatment: Gait belt Activity Tolerance: Patient tolerated treatment well Patient left: in chair;with call bell/phone within reach;with chair alarm set Nurse Communication: Mobility status PT Visit Diagnosis: Unsteadiness on feet (R26.81);Muscle weakness (generalized) (M62.81);Hemiplegia and hemiparesis;Other abnormalities of gait and mobility (R26.89) Hemiplegia - Right/Left: Left Hemiplegia - dominant/non-dominant: Non-dominant Hemiplegia - caused by: Cerebral infarction    Time: 1131-1206 PT Time Calculation (min) (ACUTE ONLY): 35 min   Charges:   PT Evaluation $PT Eval Moderate Complexity: 1 Mod PT Treatments $Gait Training: 8-22 mins        Breleigh Carpino Abner Greenspan, PT Acute Rehabilitation Services Pager: [phone] Office: [phone]   Gemini Beaumier B Aydn Ferrara 11/16/2017, 1:28 PM

## 2017-11-16 NOTE — Progress Notes (Signed)
STROKE TEAM PROGRESS NOTE   SUBJECTIVE (INTERVAL HISTORY) His wife and son are at the bedside.  Pt still has mainly left UE weakness. Stated  He was on eliquis but could not afford to pay the monthly co-pay and hence primary care surgeon to warfarin. Patient is not willing to try eliquis. His heart rate remains elevated. He does not have a primary cardiologist.  OBJECTIVE Vitals:   11/16/17 1000 11/16/17 1100 11/16/17 1135 11/16/17 1204  BP: (!) 135/95 126/87 126/87 105/77  Pulse: (!) 103 97 (!) 101   Resp: 18 (!) 21    Temp:    97.9 F (36.6 C)  TempSrc:    Oral  SpO2: 94% 96% 99%   Weight:        CBC:  Recent Labs  Lab 11/14/17 1812 11/14/17 1816 11/16/17 0052  WBC 7.9  --  8.6  NEUTROABS 5.1  --   --   HGB 14.3 15.6 13.5  HCT 46.5 46.0 43.9  MCV 94.7  --  95.2  PLT 222  --  178    Basic Metabolic Panel:  Recent Labs  Lab 11/14/17 1812 11/14/17 1816 11/16/17 0052  NA 142 143 141  K 3.9 4.1 3.6  CL 104 102 106  CO2 30  --  24  GLUCOSE 126* 120* 111*  BUN 14 17 18   CREATININE 1.16 1.10 1.15  CALCIUM 8.8*  --  8.6*    Lipid Panel:     Component Value Date/Time   CHOL 116 11/15/2017 0342   TRIG 54 11/15/2017 0342   HDL 39 (L) 11/15/2017 0342   CHOLHDL 3.0 11/15/2017 0342   VLDL 11 11/15/2017 0342   LDLCALC 66 11/15/2017 0342   HgbA1c:  Lab Results  Component Value Date   HGBA1C 5.8 (H) 11/15/2017   Urine Drug Screen: No results found for: LABOPIA, COCAINSCRNUR, LABBENZ, AMPHETMU, THCU, LABBARB  Alcohol Level No results found for: ETH  IMAGING  Ct Angio Head W Or Wo Contrast Ct Angio Neck W Or Wo Contrast 11/14/2017   ADDENDUM:  1 cm sclerotic lesion T1 most compatible with bone island though, if there is a history of cancer, consider bone scan.  11/14/2017  IMPRESSION:   CTA NECK:  1. No hemodynamically significant stenosis ICA's.  2. Patent vertebral arteries without dissection.  3. Severe LEFT C3-4 neural foraminal narrowing.   CTA HEAD:   1. No emergent large vessel occlusion or flow-limiting stenosis.  2. Mild intracranial atherosclerosis. Aortic Atherosclerosis (ICD10-I70.0). Emphysema (ICD10-J43.9).   Ct Head Code Stroke Wo Contrast 11/14/2017  IMPRESSION:  1. Age indeterminate basal ganglia lacunar infarcts superimposed on old lacunar infarcts.  2. Old small LEFT parietoccipital lobe infarct.  3. Moderate chronic small vessel ischemic changes.  4. ASPECTS is 9.   MRI pending  TTE -Left ventricle: The cavity size was mildly dilated. There was   mild focal basal hypertrophy of the septum. Systolic function was   moderately reduced. The estimated ejection fraction was in the   range of 35% to 40%. - Regional wall motion abnormality: Akinesis of the mid anterior   and basal-mid anteroseptal myocardium; hypokinesis of the apical   anterior, mid inferoseptal, apical inferior, apical septal,   apical lateral, and apical myocardium    PHYSICAL EXAM  Temp:  [97.6 F (36.4 C)-98.9 F (37.2 C)] 97.9 F (36.6 C) (08/31 1204) Pulse Rate:  [51-103] 101 (08/31 1135) Resp:  [13-24] 21 (08/31 1100) BP: (93-144)/(54-95) 105/77 (08/31 1204) SpO2:  [89 %-100 %]  99 % (08/31 1135)  General - Well nourished, well developed, in no apparent distress.  Ophthalmologic - fundi not visualized due to noncooperation.  Cardiovascular - irregularly irregular heart rate and rhythm.  Mental Status -  Level of arousal and orientation to place, and person were intact, however not oriented to time. Language including expression, naming, repetition, comprehension was assessed and found intact.  Cranial Nerves II - XII - II - Visual field intact OU. III, IV, VI - Extraocular movements intact. V - Facial sensation intact bilaterally. VII - subtle left nasolabial fold flattening. VIII - Hearing & vestibular intact bilaterally. X - Palate elevates symmetrically. XI - Chin turning & shoulder shrug intact bilaterally. XII - Tongue  protrusion intact.  Motor Strength - The patient's strength was normal in RUE and RLE, however, LUE 3-/5 proximal and distal, LLE 5-/5 proximal and distal and pronator drift was absent.  Bulk was normal and fasciculations were absent.   Motor Tone - Muscle tone was assessed at the neck and appendages and was normal.  Reflexes - The patient's reflexes were symmetrical in all extremities and he had no pathological reflexes.  Sensory - Light touch, temperature/pinprick were assessed and were symmetrical.    Coordination - The patient had normal movements in the right hand with no ataxia or dysmetria.  Tremor was absent.  Gait and Station - deferred.   ASSESSMENT/PLAN [name] is a 83 y.o. male with history of diabetes mellitus, COPD, afib on coumadin and previous stroke in 2012/2013 presenting with left-sided weakness.  He received IV TPA Thursday 11/14/2017 at 1900 hrs.  Stroke: likely right MCA infarct due to afib with subtherapeutic INR  Resultant  Left hemi-paresis  CT head - Multiple old infarcts - nothing acute.  MRI head - pending  CTA H&N - no significant stenosis  2D Echo - EF 35-40% akinesis apical and anteroseptal myocardium  LDL - 66  HgbA1c - 5.8  INR 1.1  VTE prophylaxis - SCDs  Diet - Heart healthy / carb modified with thin liquids.  warfarin daily prior to admission, now on No antithrombotic with 24h of tPA.  Patient counseled to be compliant with his antithrombotic medications  Ongoing aggressive stroke risk factor management  Therapy recommendations:  pending  Disposition:  Pending  Afib with RVR  On coumadin at home, stated compliance  However, INR 1.1 on admission  currently s/p tPA  Consider switch coumadin to DOACs based on MRI result  RVR - metoprolol 5mg  iv stat  Add metoprolol 25mg  bid for rate control  Hypertension  Stable . Permissive hypertension (OK if < 180/105) but gradually normalize in 5-7 days . Long-term BP  goal normotensive  Hyperlipidemia  Lipid lowering medication PTA:  Pravachol 10 mg daily  LDL 66, goal < 70  Current lipid lowering medication: Pravachol 10 mg daily  Continue statin at discharge  Diabetes  HgbA1c 5.8, goal < 7.0  Controlled  SSI  CBG monitoring  Other Stroke Risk Factors  Advanced age  Former cigarette smoker - quit   Hx stroke/TIA in 02/2011 - 03/2011 - CT showed left CMA and right BG old infarcts  Other Active Problems  1 cm sclerotic lesion T1 most compatible with bone [facility] day # 2 Plan: Mobilizeout of bed. Transfer to neurology floor bed. Cardiology consult for help with A. Fib management. Recommend eliquis for anticoagulation rather than warfarin and patient and family are agreeable. This patient is critically ill due to stroke s/p  tPA, Afib RVR and at significant risk of neurological worsening, death form recurrent stroke, hemorrhagic conversion, heart failure, seizure. This patient's care requires constant monitoring of vital signs, hemodynamics, respiratory and cardiac monitoring, review of multiple databases, neurological assessment, discussion with family, other specialists and medical decision making of high complexity. I spent 40 minutes of neurocritical care time in the care of this patient. I had long discussion with son and wife and patient at bedside, updated pt current condition, treatment plan and potential prognosis. They expressed understanding and appreciation.   Delia Heady, MD Stroke Neurology 11/16/2017 1:36 PM     To contact Stroke Continuity provider, please refer to [email or web address]. After hours, contact General Neurology

## 2017-11-17 ENCOUNTER — Inpatient Hospital Stay (HOSPITAL_COMMUNITY): Payer: Medicare HMO

## 2017-11-17 DIAGNOSIS — I481 Persistent atrial fibrillation: Secondary | ICD-10-CM

## 2017-11-17 DIAGNOSIS — I255 Ischemic cardiomyopathy: Secondary | ICD-10-CM

## 2017-11-17 DIAGNOSIS — R002 Palpitations: Secondary | ICD-10-CM

## 2017-11-17 DIAGNOSIS — I5043 Acute on chronic combined systolic (congestive) and diastolic (congestive) heart failure: Secondary | ICD-10-CM

## 2017-11-17 LAB — CBC
HCT: 41.7 % (ref 39.0–52.0)
Hemoglobin: 12.6 g/dL — ABNORMAL LOW (ref 13.0–17.0)
MCH: 28.9 pg (ref 26.0–34.0)
MCHC: 30.2 g/dL (ref 30.0–36.0)
MCV: 95.6 fL (ref 78.0–100.0)
PLATELETS: 154 10*3/uL (ref 150–400)
RBC: 4.36 MIL/uL (ref 4.22–5.81)
RDW: 14.1 % (ref 11.5–15.5)
WBC: 9 10*3/uL (ref 4.0–10.5)

## 2017-11-17 LAB — BASIC METABOLIC PANEL
Anion gap: 9 (ref 5–15)
BUN: 22 mg/dL (ref 8–23)
CO2: 27 mmol/L (ref 22–32)
CREATININE: 1.03 mg/dL (ref 0.61–1.24)
Calcium: 8.4 mg/dL — ABNORMAL LOW (ref 8.9–10.3)
Chloride: 103 mmol/L (ref 98–111)
GFR calc Af Amer: >60 mL/min (ref >60)
Glucose, Bld: 118 mg/dL — ABNORMAL HIGH (ref 70–99)
Potassium: 3.6 mmol/L (ref 3.5–5.1)
SODIUM: 139 mmol/L (ref 135–145)

## 2017-11-17 LAB — NM MYOCAR MULTI W/SPECT W/WALL MOTION / EF
CHL CUP MPHR: 139 {beats}/min
CSEPHR: 66 %
CSEPPHR: 93 {beats}/min
Estimated workload: 1 METS
Exercise duration (min): 5 min
Exercise duration (sec): 23 s
Rest HR: 86 {beats}/min

## 2017-11-17 LAB — GLUCOSE, CAPILLARY
Glucose-Capillary: 157 mg/dL — ABNORMAL HIGH (ref 70–99)
Glucose-Capillary: 107 mg/dL — ABNORMAL HIGH (ref 70–99)
Glucose-Capillary: 164 mg/dL — ABNORMAL HIGH (ref 70–99)

## 2017-11-17 MED ORDER — TECHNETIUM TC 99M TETROFOSMIN IV KIT
10.0000 | PACK | Freq: Once | INTRAVENOUS | Status: AC | PRN
  Administered 2017-11-17: 10 via INTRAVENOUS

## 2017-11-17 MED ORDER — FUROSEMIDE 40 MG PO TABS
40.0000 mg | ORAL_TABLET | Freq: Every day | ORAL | Status: DC
  Administered 2017-11-18 – 2017-11-20 (×3): 40 mg via ORAL
  Filled 2017-11-17 (×3): qty 1

## 2017-11-17 MED ORDER — TECHNETIUM TC 99M TETROFOSMIN IV KIT
30.0000 | PACK | Freq: Once | INTRAVENOUS | Status: AC | PRN
  Administered 2017-11-17: 30 via INTRAVENOUS

## 2017-11-17 MED ORDER — FUROSEMIDE 10 MG/ML IJ SOLN
40.0000 mg | Freq: Once | INTRAMUSCULAR | Status: AC
  Administered 2017-11-17: 40 mg via INTRAVENOUS
  Filled 2017-11-17: qty 4

## 2017-11-17 MED ORDER — REGADENOSON 0.4 MG/5ML IV SOLN
INTRAVENOUS | Status: AC
  Administered 2017-11-17: 0.4 mg via INTRAVENOUS
  Filled 2017-11-17: qty 5

## 2017-11-17 MED ORDER — REGADENOSON 0.4 MG/5ML IV SOLN
0.4000 mg | Freq: Once | INTRAVENOUS | Status: AC
  Administered 2017-11-17: 0.4 mg via INTRAVENOUS

## 2017-11-17 NOTE — Progress Notes (Signed)
Sent for patient as I was informed patient could now do MRI .  Transport got there and RN called back down and said patient now could not lay flat for MRI.  MRI still on hold.

## 2017-11-17 NOTE — Progress Notes (Signed)
STROKE TEAM PROGRESS NOTE   SUBJECTIVE (INTERVAL HISTORY) Family at the bedside. Myoview stress test this AM. Results pending. MRI pending. Up with assistance and transfer to floor.   OBJECTIVE Vitals:   11/17/17 1100 11/17/17 1200 11/17/17 1220 11/17/17 1300  BP: 106/63 117/78  115/71  Pulse: 86 82  83  Resp: 19 20  18   Temp:   98 F (36.7 C)   TempSrc:   Oral   SpO2: 95% 97%  98%  Weight:        CBC:  Recent Labs  Lab 11/14/17 1812  11/16/17 0052 11/17/17 0315  WBC 7.9  --  8.6 9.0  NEUTROABS 5.1  --   --   --   HGB 14.3   < > 13.5 12.6*  HCT 46.5   < > 43.9 41.7  MCV 94.7  --  95.2 95.6  PLT 222  --  178 154   < > = values in this interval not displayed.    Basic Metabolic Panel:  Recent Labs  Lab 11/16/17 0052 11/17/17 0315  NA 141 139  K 3.6 3.6  CL 106 103  CO2 24 27  GLUCOSE 111* 118*  BUN 18 22  CREATININE 1.15 1.03  CALCIUM 8.6* 8.4*    Lipid Panel:     Component Value Date/Time   CHOL 116 11/15/2017 0342   TRIG 54 11/15/2017 0342   HDL 39 (L) 11/15/2017 0342   CHOLHDL 3.0 11/15/2017 0342   VLDL 11 11/15/2017 0342   LDLCALC 66 11/15/2017 0342   HgbA1c:  Lab Results  Component Value Date   HGBA1C 5.8 (H) 11/15/2017   Urine Drug Screen: No results found for: LABOPIA, COCAINSCRNUR, LABBENZ, AMPHETMU, THCU, LABBARB  Alcohol Level No results found for: ETH  IMAGING  Ct Angio Head W Or Wo Contrast Ct Angio Neck W Or Wo Contrast 11/14/2017   ADDENDUM:  1 cm sclerotic lesion T1 most compatible with bone island though, if there is a history of cancer, consider bone scan.  11/14/2017  IMPRESSION:   CTA NECK:  1. No hemodynamically significant stenosis ICA's.  2. Patent vertebral arteries without dissection.  3. Severe LEFT C3-4 neural foraminal narrowing.   CTA HEAD:  1. No emergent large vessel occlusion or flow-limiting stenosis.  2. Mild intracranial atherosclerosis. Aortic Atherosclerosis (ICD10-I70.0). Emphysema (ICD10-J43.9).    Ct Head Code Stroke Wo Contrast 11/14/2017  IMPRESSION:  1. Age indeterminate basal ganglia lacunar infarcts superimposed on old lacunar infarcts.  2. Old small LEFT parietoccipital lobe infarct.  3. Moderate chronic small vessel ischemic changes.  4. ASPECTS is 9.   MRI pending  Transthoracic Echocardiogram  11/15/2017 Left ventricle: The cavity size was mildly dilated. There was   mild focal basal hypertrophy of the septum. Systolic function was   moderately reduced. The estimated ejection fraction was in the   range of 35% to 40%. - Regional wall motion abnormality: Akinesis of the mid anterior   and basal-mid anteroseptal myocardium; hypokinesis of the apical   anterior, mid inferoseptal, apical inferior, apical septal,   apical lateral, and apical myocardium   Myoview Stress Test 11/17/2017 IMPRESSION: 1. No reversible ischemia. Fixed defect in the inferolateral wall may represent infarction. Decrease counts in the inferior wall are favored diaphragmatic attenuation.  2. Global hypokinesia. LEFT ventricular dilatation at rest and stress.  3. Left ventricular ejection fraction 28%  4. Non invasive risk stratification*: High (based on low ejection fraction).    PHYSICAL EXAM  Temp:  [  98 F (36.7 C)-98.7 F (37.1 C)] 98 F (36.7 C) (09/01 1220) Pulse Rate:  [66-97] 83 (09/01 1300) Resp:  [16-26] 18 (09/01 1300) BP: (94-169)/(59-154) 115/71 (09/01 1300) SpO2:  [90 %-98 %] 98 % (09/01 1300)  General - Well nourished, well developed, in no apparent distress.  Ophthalmologic - fundi not visualized due to noncooperation.  Cardiovascular - irregularly irregular heart rate and rhythm.  Mental Status -  Level of arousal and orientation to place, and person were intact, however not oriented to time. Language including expression, naming, repetition, comprehension was assessed and found intact.  Cranial Nerves II - XII - II - Visual field intact OU. III, IV,  VI - Extraocular movements intact. V - Facial sensation intact bilaterally. VII - subtle left nasolabial fold flattening. VIII - Hearing & vestibular intact bilaterally. X - Palate elevates symmetrically. XI - Chin turning & shoulder shrug intact bilaterally. XII - Tongue protrusion intact.  Motor Strength - The patient's strength was normal in RUE and RLE, however, LUE 3-/5 proximal and distal, LLE 5-/5 proximal and distal and pronator drift was absent.  Bulk was normal and fasciculations were absent.   Motor Tone - Muscle tone was assessed at the neck and appendages and was normal.  Reflexes - The patient's reflexes were symmetrical in all extremities and he had no pathological reflexes.  Sensory - Light touch, temperature/pinprick were assessed and were symmetrical.    Coordination - The patient had normal movements in the right hand with no ataxia or dysmetria.  Tremor was absent.  Gait and Station - deferred.   ASSESSMENT/PLAN [name] is a 83 y.o. male with history of diabetes mellitus, COPD, afib on coumadin and previous stroke in 2012/2013 presenting with left-sided weakness.  He received IV TPA Thursday 11/14/2017 at 1900 hrs.  Stroke: likely right MCA infarct due to afib with subtherapeutic INR  Resultant  Left hemi-paresis  CT head - Multiple old infarcts - nothing acute.  MRI head - pending - Pt unable to lie flat early this AM  Myoview Stress Test - EF 28%. Non invasive risk stratification*: High (based on low EF).  CTA H&N - no significant stenosis  2D Echo - EF 35-40% akinesis apical and anteroseptal myocardium  LDL - 66  HgbA1c - 5.8  INR 1.1  VTE prophylaxis - SCDs  Diet - Heart healthy / carb modified with thin liquids.  warfarin daily prior to admission, now on No antithrombotic with 24h of tPA.  Patient counseled to be compliant with his antithrombotic medications  Ongoing aggressive stroke risk factor management  Therapy  recommendations:  pending  Disposition:  Pending  Afib with RVR  On coumadin at home, stated compliance  However, INR 1.1 on admission  currently s/p tPA  Consider switch coumadin to DOACs based on MRI result  RVR - metoprolol 5mg  iv stat  Add metoprolol 25mg  bid for rate control  Hypertension  Stable . Permissive hypertension (OK if < 180/105) but gradually normalize in 5-7 days . Long-term BP goal normotensive  Hyperlipidemia  Lipid lowering medication PTA:  Pravachol 10 mg daily  LDL 66, goal < 70  Current lipid lowering medication: Pravachol 10 mg daily  Continue statin at discharge  Diabetes  HgbA1c 5.8, goal < 7.0  Controlled  SSI  CBG monitoring  Other Stroke Risk Factors  Advanced age  Former cigarette smoker - quit   Hx stroke/TIA in 02/2011 - 03/2011 - CT showed left CMA and right BG  old infarcts  Other Active Problems  1 cm sclerotic lesion T1 most compatible with bone island  CHF - improved  Afib with RVR - improved    PLAN  Await MRI and decision to start DOAC  Appreciate Cardiology help.  Will leave patient in NICU tonight and tx to floor tomorrow based on low EF and CHF.  Repeat labs in AM    Delton See PA-C [facility] Pager [phone] 11/17/2017, 1:57 PM   Hospital day # 3 I have personally examined this patient, reviewed notes, independently viewed imaging studies, participated in medical decision making and plan of care.ROS completed by me personally and pertinent positives fully documented  I have made any additions or clarifications directly to the above note. Agree with note above. Attempt MRI if he can lay flat. Transfer to floor. D/W Dr Gala Romney.This patient is critically ill and at significant risk of neurological worsening, death and care requires constant monitoring of vital signs, hemodynamics,respiratory and cardiac monitoring, extensive review of multiple databases, frequent neurological  assessment, discussion with family, other specialists and medical decision making of high complexity.I have made any additions or clarifications directly to the above note.This critical care time does not reflect procedure time, or teaching time or supervisory time of PA/NP/Med Resident etc but could involve care discussion time.  I spent 30 minutes of neurocritical care time  in the care of  this patient.      Delia Heady, MD Medical Director [facility] Pager: [phone] 11/18/2017 8:16 AM      To contact Stroke Continuity provider, please refer to [email or web address]. After hours, contact General Neurology

## 2017-11-17 NOTE — Progress Notes (Signed)
   Otis Brace presented for a nuclear stress test today.  No immediate complications.  Stress imaging is pending at this time.  Preliminary EKG findings may be listed in the chart, but the stress test result will not be finalized until perfusion imaging is complete.  One day study, [facility] to read.  Georgie Chard, NP-C 11/17/2017, 9:56 AM

## 2017-11-17 NOTE — Progress Notes (Addendum)
Progress Note  Patient Name: Chad Franklin Date of Encounter: 11/17/2017  Primary Cardiologist: New to [facility]   Subjective   Pt seen in nuclear medicine and doing well with no complaints. Tolerated procedure ok. Denies chest pain or palpitations.   Inpatient Medications    Scheduled Meds: .  stroke: mapping our early stages of recovery book   Does not apply Once  . aspirin EC  81 mg Oral Daily  . bisacodyl  10 mg Rectal Once  . carvedilol  6.25 mg Oral BID WC  . insulin aspart  0-15 Units Subcutaneous TID WC  . insulin aspart  0-5 Units Subcutaneous QHS  . losartan  25 mg Oral Daily  . multivitamin with minerals  1 tablet Oral Daily  . PARoxetine  20 mg Oral Daily  . pravastatin  10 mg Oral Daily   Continuous Infusions:  PRN Meds: acetaminophen **OR** acetaminophen (TYLENOL) oral liquid 160 mg/5 mL **OR** acetaminophen, ondansetron (ZOFRAN) IV, senna-docusate   Vital Signs    Vitals:   11/17/17 0700 11/17/17 0937 11/17/17 0951 11/17/17 0952  BP: 109/69 110/74 102/67 103/69  Pulse: 78 87 83 86  Resp: 17     Temp:      TempSrc:      SpO2: 95%     Weight:        Intake/Output Summary (Last 24 hours) at 11/17/2017 1014 Last data filed at 11/17/2017 0600 Gross per 24 hour  Intake 734.49 ml  Output 650 ml  Net 84.49 ml   Filed Weights   11/14/17 1800  Weight: 104.7 kg   Physical Exam   General: Elderly, pleasant, NAD Skin: Warm, dry, intact  Head: Normocephalic, atraumatic, clear, moist mucus membranes. Neck: Negative for carotid bruits. JVP 8-10 Lungs:Clear to ausculation bilaterally. No wheezes, rales, or rhonchi. Breathing is unlabored. Cardiovascular: Irregularly irregular with S1 S2. No murmurs, rubs, gallops, or LV heave appreciated. Abdomen: Soft, non-tender, non-distended with normoactive bowel sounds. No obvious abdominal masses. MSK: Strength and tone appear normal for age. 5/5 in all extremities Extremities: No edema. No clubbing or cyanosis.  DP/PT pulses 2+ bilaterally Neuro: Alert and oriented. No focal deficits. No facial asymmetry. MAE spontaneously. Psych: Responds to questions appropriately with normal affect.    Labs    Chemistry Recent Labs  Lab 11/14/17 1812 11/14/17 1816 11/16/17 0052 11/17/17 0315  NA 142 143 141 139  K 3.9 4.1 3.6 3.6  CL 104 102 106 103  CO2 30  --  24 27  GLUCOSE 126* 120* 111* 118*  BUN 14 17 18 22   CREATININE 1.16 1.10 1.15 1.03  CALCIUM 8.8*  --  8.6* 8.4*  PROT 7.4  --   --   --   ALBUMIN 3.7  --   --   --   AST 21  --   --   --   ALT 15  --   --   --   ALKPHOS 74  --   --   --   BILITOT 1.0  --   --   --   GFRNONAA 57*  --  58* >60  GFRAA >60  --  >60 >60  ANIONGAP 8  --  11 9     Hematology Recent Labs  Lab 11/14/17 1812 11/14/17 1816 11/16/17 0052 11/17/17 0315  WBC 7.9  --  8.6 9.0  RBC 4.91  --  4.61 4.36  HGB 14.3 15.6 13.5 12.6*  HCT 46.5 46.0 43.9 41.7  MCV 94.7  --  95.2 95.6  MCH 29.1  --  29.3 28.9  MCHC 30.8  --  30.8 30.2  RDW 13.9  --  14.2 14.1  PLT 222  --  178 154    Cardiac Enzymes Recent Labs  Lab 11/15/17 1329 11/15/17 1854 11/16/17 0052  TROPONINI <0.03 <0.03 <0.03    Recent Labs  Lab 11/14/17 1814  TROPIPOC 0.01     BNPNo results for input(s): BNP, PROBNP in the last 168 hours.   DDimer No results for input(s): DDIMER in the last 168 hours.   Radiology    Ct Head Wo Contrast  Result Date: 11/15/2017 CLINICAL DATA:  Prior stroke, new onset vomiting. EXAM: CT HEAD WITHOUT CONTRAST TECHNIQUE: Contiguous axial images were obtained from the base of the skull through the vertex without intravenous contrast. COMPARISON:  CT 11/14/2017 FINDINGS: Brain: No acute intraparenchymal hemorrhage, intra-axial mass or midline shift. No large vascular territory infarct. Chronic stable involutional changes of the brain with chronic appearing small vessel ischemic disease of periventricular subcortical white matter. Small focus of left occipital  encephalomalacia. Chronic bilateral basal ganglial lacunar infarcts. No abnormal extra axial fluid collections. Midline fourth ventricle and basal cisterns. Vascular: No hyperdense vessel sign. Skull: No acute skull fracture or suspicious osseous lesions. Sinuses/Orbits: Chronic right maxillary sinusitis with mucous retention cyst of the left maxillary sinus. Mastoids are clear. Included ocular globes and orbital contents are stable and unremarkable. Other: None IMPRESSION: Chronic appearing small vessel ischemic disease and bilateral basal ganglial lacunar infarcts. Encephalomalacia left posterior parietal lobe from remote infarct. No significant change from 1 day prior. Electronically Signed   By: Tollie Eth M.D.   On: 11/15/2017 16:39   Dg Chest Port 1 View  Result Date: 11/15/2017 CLINICAL DATA:  Chest tightness earlier today, nausea, vomiting, history diabetes mellitus, stroke, COPD, former smoker EXAM: PORTABLE CHEST 1 VIEW COMPARISON:  Portable exam 1558 hours compared to 08/15/2016 FINDINGS: Enlargement of cardiac silhouette with pulmonary vascular congestion. Mediastinal contours normal. Minimal chronic accentuation of interstitial markings which could reflect minimal chronic pulmonary edema. No segmental consolidation, pleural effusion or pneumothorax. Bones demineralized. IMPRESSION: Enlargement of cardiac silhouette with pulmonary vascular congestion. Chronic accentuation of interstitial markings question minimal chronic failure. Electronically Signed   By: Ulyses Southward M.D.   On: 11/15/2017 16:14   Telemetry    11/17/17 NSR HR 70-80's - Personally Reviewed  ECG    No new tracing as of 11/17/17- Personally Reviewed  Cardiac Studies   Myoview stress: 11/17/17 Pending   Patient Profile     83 y.o. male with a hx of DM2, COPD, permanent atrial fibrillation on chronic Coumadin and prior CVA 2012/2013 who is being seen today for the evaluation of atrial fibrillation with newly decreased LV  function and the need for DOAC therapy at the request of Dr. Pearlean Brownie.   Assessment & Plan    1.  Permanent atrial fibrillation on Coumadin: -Patient presented with left-sided weakness on 11/14/2017, found to have right MCA infarct likely secondary to subtherapeutic INR.  He is on chronic Coumadin therapy for permanent atrial fibrillation and found to have an INR of 1.18 on admission, although reports Coumadin compliance -Per neurology, consider transitioning to DOPC based on MRI results>>currently s/p tPA administration  -Carvedilol 6.25mg  twice daily added for greater rate control  -Add Eliquis once cleared through neurology  -CHA2DS2VASc = at least 7  2.  Right MCA infarct likely secondary to subtherapeutic INR: -Patient presented with left-sided  weakness found to have right MCA infarct secondary to subtherapeutic INR level. -CT head on admission showed multiple old infarcts however nothing acute -Per primary team   3. New acute systolic CHF: -Pt underwent Myoview stress test today, 11/17/17 and tolerated well>>>stress findings are pending  -Echocardiogram performed 32,019 with EF of 35 to 40% with regional wall motion abnormality including akinesis of the mid anterior and basal mid anterior septal myocardium, hypokinesis of the apical anterior and mid inferior septal, apical inferior, apical septal, apical lateral and apical myocardium>>> once okay to proceed per neurology, will need ischemic work-up with consideration for multivessel CAD versus Takotsubo  4.  Hypertension: -Low normal, 103/69>102/67>110/74 -Permissive hypertension per neurology with long-term BP goal, normotensive  5.  HLD: -Stable, LDL 66 with a goal of less than 70 -Continue current regimen  6.  DM2: -Hemoglobin A1c, 5.8 this admission -SSI for glucose control while inpatient  Signed, Georgie Chard NP-C [facility] Pager: [phone] 11/17/2017, 10:14 AM     For questions or updates, please contact     Please consult [email or web address] for contact info under Cardiology/STEMI.   Stroke assoc with subtherapeutic INR  Atrial fibrillation  CHF  Acute systolic chronic   Cardiomyopathy  WMA and perfusion defects new since 3/18   Patient is symptomatically improved today.  Myoview confirms echocardiogram with depressed left ventricular function and wall motion supported by perfusion defects.  The patient has had a history of recurrent protracted gastric discomfort sometimes precipitated by exertion.  This may well have been anginal equivalent/myocardial infarction.  Given the acuity of his stroke, however, in the absence of significant ischemia, medical therapy is recommended coronary disease as well as left ventricular dysfunction and without the need to undertake further invasive diagnostic testing.  He may well benefit from CTA down the road as opposed to catheterization.  Currently blood pressure is tolerating losartan and carvedilol.  According to neurology notes, he is agreeable to apixoban which will be started by neurology.  Volume status is much improved although still volume overloaded.  We will continue diuretics by mouth  Reviewed findings and plan with patient wife and children

## 2017-11-18 ENCOUNTER — Encounter (HOSPITAL_COMMUNITY): Payer: Self-pay | Admitting: Physical Medicine and Rehabilitation

## 2017-11-18 DIAGNOSIS — I482 Chronic atrial fibrillation, unspecified: Secondary | ICD-10-CM

## 2017-11-18 DIAGNOSIS — I639 Cerebral infarction, unspecified: Secondary | ICD-10-CM

## 2017-11-18 DIAGNOSIS — E876 Hypokalemia: Secondary | ICD-10-CM

## 2017-11-18 DIAGNOSIS — D62 Acute posthemorrhagic anemia: Secondary | ICD-10-CM

## 2017-11-18 DIAGNOSIS — I4891 Unspecified atrial fibrillation: Secondary | ICD-10-CM

## 2017-11-18 DIAGNOSIS — E119 Type 2 diabetes mellitus without complications: Secondary | ICD-10-CM

## 2017-11-18 DIAGNOSIS — Z8673 Personal history of transient ischemic attack (TIA), and cerebral infarction without residual deficits: Secondary | ICD-10-CM

## 2017-11-18 DIAGNOSIS — I5041 Acute combined systolic (congestive) and diastolic (congestive) heart failure: Secondary | ICD-10-CM

## 2017-11-18 DIAGNOSIS — J449 Chronic obstructive pulmonary disease, unspecified: Secondary | ICD-10-CM

## 2017-11-18 LAB — BASIC METABOLIC PANEL
Anion gap: 6 (ref 5–15)
BUN: 28 mg/dL — ABNORMAL HIGH (ref 8–23)
CHLORIDE: 101 mmol/L (ref 98–111)
CO2: 31 mmol/L (ref 22–32)
Calcium: 8.3 mg/dL — ABNORMAL LOW (ref 8.9–10.3)
Creatinine, Ser: 0.95 mg/dL (ref 0.61–1.24)
GFR calc Af Amer: >60 mL/min (ref >60)
GFR calc non Af Amer: >60 mL/min (ref >60)
Glucose, Bld: 115 mg/dL — ABNORMAL HIGH (ref 70–99)
Potassium: 3.4 mmol/L — ABNORMAL LOW (ref 3.5–5.1)
Sodium: 138 mmol/L (ref 135–145)

## 2017-11-18 LAB — GLUCOSE, CAPILLARY
GLUCOSE-CAPILLARY: 121 mg/dL — AB (ref 70–99)
GLUCOSE-CAPILLARY: 127 mg/dL — AB (ref 70–99)
Glucose-Capillary: 136 mg/dL — ABNORMAL HIGH (ref 70–99)
Glucose-Capillary: 122 mg/dL — ABNORMAL HIGH (ref 70–99)

## 2017-11-18 LAB — CBC
HEMATOCRIT: 38.9 % — AB (ref 39.0–52.0)
HEMOGLOBIN: 12 g/dL — AB (ref 13.0–17.0)
MCH: 29.3 pg (ref 26.0–34.0)
MCHC: 30.8 g/dL (ref 30.0–36.0)
MCV: 95.1 fL (ref 78.0–100.0)
Platelets: 144 10*3/uL — ABNORMAL LOW (ref 150–400)
RBC: 4.09 MIL/uL — ABNORMAL LOW (ref 4.22–5.81)
RDW: 13.9 % (ref 11.5–15.5)
WBC: 8.4 10*3/uL (ref 4.0–10.5)

## 2017-11-18 MED ORDER — POTASSIUM CHLORIDE CRYS ER 20 MEQ PO TBCR
20.0000 meq | EXTENDED_RELEASE_TABLET | Freq: Every day | ORAL | Status: DC
  Administered 2017-11-19 – 2017-11-20 (×2): 20 meq via ORAL
  Filled 2017-11-18 (×2): qty 1

## 2017-11-18 MED ORDER — APIXABAN 5 MG PO TABS: 5.0000 mg | ORAL_TABLET | Freq: Two times a day (BID) | ORAL | Status: DC

## 2017-11-18 MED ORDER — POTASSIUM CHLORIDE CRYS ER 20 MEQ PO TBCR
20.0000 meq | EXTENDED_RELEASE_TABLET | Freq: Two times a day (BID) | ORAL | Status: AC
  Administered 2017-11-18 (×2): 20 meq via ORAL
  Filled 2017-11-18 (×2): qty 1

## 2017-11-18 MED ORDER — APIXABAN 5 MG PO TABS
5.0000 mg | ORAL_TABLET | Freq: Two times a day (BID) | ORAL | Status: DC
  Administered 2017-11-19 – 2017-11-20 (×3): 5 mg via ORAL
  Filled 2017-11-18 (×3): qty 1

## 2017-11-18 NOTE — Progress Notes (Signed)
Physical Therapy Treatment Patient Details Name: Chad Franklin MRN: [id number] DOB: 02-Jul-1934 Today's Date: 11/18/2017    History of Present Illness 83 yo admitted with left weakness s/p tPA with CT demonstrating Old small LEFT parietoccipital lobe infarct and MRI pending. PMhx: Afib, DM, COPD, stroke (patient and wife denies this) on Coumadin     PT Comments    Patient seen for activity progression. Tolerated in room gait, upright functional activities, and dynamic trunk control with UE/LE exercises at EOB. Patient showing improvements in activity tolerance and overall function. Remains highly motivated to progress. CIR remains appropriate. Will continue to see and progress as tolerated.   Follow Up Recommendations  CIR;Supervision/Assistance - 24 hour     Equipment Recommendations  3in1 (PT);Other (comment)(walking AD TBD)    Recommendations for Other Services OT consult     Precautions / Restrictions Precautions Precautions: Fall Precaution Comments: fall immediately prior to admission Restrictions Weight Bearing Restrictions: No    Mobility  Bed Mobility Overal bed mobility: Needs Assistance Bed Mobility: Supine to Sit;Sit to Supine     Supine to sit: Min assist Sit to supine: Min assist   General bed mobility comments: Min assist for safety upon coming to EOB on the left utilizing RUE and RLE to initiate movement via rails and push off.  Assist to reposition in supine.  Transfers Overall transfer level: Needs assistance Equipment used: 2 person hand held assist Transfers: Sit to/from Stand Sit to Stand: Min assist;+2 physical assistance         General transfer comment: Min assist for stability during anterior translation and power up to standing. VCs for LLE positioning prior to power up. Assist for stability in standing.  Ambulation/Gait Ambulation/Gait assistance: Mod assist;+2 physical assistance Gait Distance (Feet): 40 Feet Assistive device: 2 person  hand held assist Gait Pattern/deviations: Step-to pattern;Narrow base of support;Decreased weight shift to left Gait velocity: decreased Gait velocity interpretation: <1.31 ft/sec, indicative of household ambulator General Gait Details: patient with poor ability to coordinate LLE stride and placement during intial contact. Verbal and visual cues for placing and manual facilitation of weight shift laterally in pre swing.  Increased physical assist to maintain upright.   Stairs             Wheelchair Mobility    Modified Rankin (Stroke Patients Only) Modified Rankin (Stroke Patients Only) Pre-Morbid Rankin Score: No symptoms Modified Rankin: Moderately severe disability     Balance Overall balance assessment: Needs assistance Sitting-balance support: Feet supported Sitting balance-Leahy Scale: Fair Sitting balance - Comments: patient able to perform static sitting without difficulty, some modest challenge tolerated Postural control: Right lateral lean Standing balance support: During functional activity Standing balance-Leahy Scale: Poor Standing balance comment: VCs for engagement of LLE during standing activities, some right/posterior bias                            Cognition Arousal/Alertness: Awake/alert Behavior During Therapy: WFL for tasks assessed/performed Overall Cognitive Status: Impaired/Different from baseline Area of Impairment: Problem solving                         Safety/Judgement: Decreased awareness of deficits;Decreased awareness of safety   Problem Solving: Requires verbal cues;Requires tactile cues General Comments: Some intermittent cues required throughout session and during functional task performance      Exercises Other Exercises Other Exercises: dynamic trunk control activities at EOB Other Exercises: Bilateral 'Boxing'  activities with trunk support and AAROM of LUE with scapular stabilization provided manually and LUE  supported against gravity Other Exercises: Bilateral LE hip flexion/ kick outs with trunk support    General Comments        Pertinent Vitals/Pain Pain Assessment: No/denies pain    Home Living                      Prior Function            PT Goals (current goals can now be found in the care plan section) Acute Rehab PT Goals Patient Stated Goal: return home and haul junk PT Goal Formulation: With patient Time For Goal Achievement: 11/30/17 Potential to Achieve Goals: Good Progress towards PT goals: Progressing toward goals    Frequency    Min 4X/week      PT Plan Current plan remains appropriate    Co-evaluation PT/OT/SLP Co-Evaluation/Treatment: Yes Reason for Co-Treatment: Complexity of the patient's impairments (multi-system involvement) PT goals addressed during session: Mobility/safety with mobility OT goals addressed during session: ADL's and self-care      AM-PAC PT "6 Clicks" Daily Activity  Outcome Measure  Difficulty turning over in bed (including adjusting bedclothes, sheets and blankets)?: A Little Difficulty moving from lying on back to sitting on the side of the bed? : A Little Difficulty sitting down on and standing up from a chair with arms (e.g., wheelchair, bedside commode, etc,.)?: A Little Help needed moving to and from a bed to chair (including a wheelchair)?: A Lot Help needed walking in hospital room?: A Lot Help needed climbing 3-5 steps with a railing? : A Lot 6 Click Score: 15    End of Session Equipment Utilized During Treatment: Gait belt Activity Tolerance: Patient tolerated treatment well Patient left: in bed;with call bell/phone within reach Nurse Communication: Mobility status PT Visit Diagnosis: Unsteadiness on feet (R26.81);Muscle weakness (generalized) (M62.81);Hemiplegia and hemiparesis;Other abnormalities of gait and mobility (R26.89) Hemiplegia - Right/Left: Left Hemiplegia - dominant/non-dominant:  Non-dominant Hemiplegia - caused by: Cerebral infarction     Time: 1610-9604 PT Time Calculation (min) (ACUTE ONLY): 25 min  Charges:  $Therapeutic Activity: 8-22 mins                     Charlotte Crumb, PT DPT  Board Certified Neurologic Specialist [phone]    Fabio Asa 11/18/2017, 10:29 AM

## 2017-11-18 NOTE — Evaluation (Signed)
Occupational Therapy Evaluation Patient Details Name: Chad Franklin MRN: [id number] DOB: [birth year] Today's Date: 11/18/2017    History of Present Illness 83 yo admitted with left weakness s/p tPA with CT demonstrating Old small LEFT parietoccipital lobe infarct and MRI pending. 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 Pt is currently presenting with deficits in LUE (grossly 2+/5 for shoulder and elbow, wrist and hand 1/5), balance, independence in ADL and functional transfers. Pt will require skilled OT in the acute setting and afterwards at the CIR level - good family support, fiercly motivated to return to PLOF. Next session establish HEp for LUE.    Follow Up Recommendations  CIR;Supervision/Assistance - 24 hour    Equipment Recommendations  Other (comment)(defer to next venue)    Recommendations for Other Services Rehab consult     Precautions / Restrictions Precautions Precautions: Fall Precaution Comments: fall immediately prior to admission Restrictions Weight Bearing Restrictions: No      Mobility Bed Mobility Overal bed mobility: Needs Assistance Bed Mobility: Supine to Sit;Sit to Supine     Supine to sit: Min assist Sit to supine: Min assist   General bed mobility comments: Min assist for safety upon coming to EOB on the left utilizing RUE and RLE to initiate movement via rails and push off.  Assist to reposition in supine.  Transfers Overall transfer level: Needs assistance Equipment used: 2 person hand held assist Transfers: Sit to/from Stand Sit to Stand: Min assist;+2 physical assistance         General transfer comment: Min assist for stability during anterior translation and power up to standing. VCs for LLE positioning prior to power up. Assist for stability in standing.    Balance Overall balance assessment: Needs  assistance Sitting-balance support: Feet supported Sitting balance-Leahy Scale: Fair Sitting balance - Comments: patient able to perform static sitting without difficulty, some modest challenge tolerated Postural control: Right lateral lean Standing balance support: During functional activity Standing balance-Leahy Scale: Poor Standing balance comment: VCs for engagement of LLE during standing activities, some right/posterior bias                           ADL either performed or assessed with clinical judgement   ADL Overall ADL's : Needs assistance/impaired Eating/Feeding: Moderate assistance Eating/Feeding Details (indicate cue type and reason): assist for BUE tasks (cutting) educated on using RUE to place LUE in helpful position Grooming: Wash/dry hands;Wash/dry face;Brushing hair;Moderate assistance;Standing Grooming Details (indicate cue type and reason): mod A and cues for standing balance; Pt attempting to use RUE to engage LUE in tasks - perfoming most single handledly (wringing out wash cloth, using wash cloth to wash LUE) Upper Body Bathing: Minimal assistance;Sitting   Lower Body Bathing: Moderate assistance;Sit to/from stand   Upper Body Dressing : Maximal assistance;Sitting Upper Body Dressing Details (indicate cue type and reason): EOB to don hospital gown like robe Lower Body Dressing: Sitting/lateral leans;Maximal assistance Lower Body Dressing Details (indicate cue type and reason): to don socks Toilet Transfer: Moderate assistance;+2 for physical assistance;+2 for safety/equipment;Ambulation Toilet Transfer Details (indicate cue type and reason): 2 person HHA Toileting- Clothing Manipulation and Hygiene: Moderate assistance;Sitting/lateral lean       Functional mobility during ADLs: Moderate assistance;+2 for physical assistance;+2 for safety/equipment(2 person HHA)       Vision Patient Visual Report:  No change from baseline       Perception      Praxis      Pertinent Vitals/Pain Pain Assessment: No/denies pain     Hand Dominance Right   Extremity/Trunk Assessment Upper Extremity Assessment Upper Extremity Assessment: LUE deficits/detail LUE Deficits / Details: shoulder and elbow 2+/5, wrist and hand 1/5, sensation intact LUE Sensation: WNL LUE Coordination: decreased fine motor;decreased gross motor   Lower Extremity Assessment Lower Extremity Assessment: Defer to PT evaluation       Communication Communication Communication: No difficulties   Cognition Arousal/Alertness: Awake/alert Behavior During Therapy: WFL for tasks assessed/performed Overall Cognitive Status: Impaired/Different from baseline Area of Impairment: Problem solving                         Safety/Judgement: Decreased awareness of deficits;Decreased awareness of safety   Problem Solving: Requires verbal cues;Requires tactile cues General Comments: Some intermittent cues required throughout session and during functional task performance   General Comments  wife present throughout the session    Exercises Exercises: Other exercises Other Exercises Other Exercises: dynamic trunk control activities at EOB Other Exercises: Bilateral 'Boxing' activities with trunk support and AAROM of LUE with scapular stabilization provided manually and LUE supported against gravity Other Exercises: Bilateral LE hip flexion/ kick outs with trunk support   Shoulder Instructions      Home Living Family/patient expects to be discharged to:: Private residence Living Arrangements: Spouse/significant other Available Help at Discharge: Family;Available 24 hours/day Type of Home: House Home Access: Stairs to enter [employer] of Steps: 3   Home Layout: One level     Bathroom Shower/Tub: Chief Strategy Officer: Standard     Home Equipment: None   Additional Comments: still helps his son working with cars      Prior  Functioning/Environment Level of Independence: Independent        Comments: drives, still works with son        OT Problem List: Decreased strength;Decreased range of motion;Impaired balance (sitting and/or standing);Decreased coordination;Decreased knowledge of use of DME or AE;Impaired UE functional use      OT Treatment/Interventions: Self-care/ADL training;Therapeutic exercise;Neuromuscular education;DME and/or AE instruction;Manual therapy;Therapeutic activities;Patient/family education;Balance training    OT Goals(Current goals can be found in the care plan section) Acute Rehab OT Goals Patient Stated Goal: return home and haul junk OT Goal Formulation: With patient/family Time For Goal Achievement: 12/02/17 Potential to Achieve Goals: Good ADL Goals Pt Will Perform Grooming: with supervision;standing;with min assist Pt Will Transfer to Toilet: with min assist;ambulating Pt Will Perform Toileting - Clothing Manipulation and hygiene: with set-up;sitting/lateral leans Pt/caregiver will Perform Home Exercise Program: Left upper extremity;With written HEP provided;Increased ROM;Increased strength  OT Frequency: Min 3X/week   Barriers to D/C:            Co-evaluation PT/OT/SLP Co-Evaluation/Treatment: Yes Reason for Co-Treatment: Complexity of the patient's impairments (multi-system involvement) PT goals addressed during session: Mobility/safety with mobility OT goals addressed during session: ADL's and self-care      AM-PAC PT "6 Clicks" Daily Activity     Outcome Measure Help from another person eating meals?: A Little Help from another person taking care of personal grooming?: A Lot Help from another person toileting, which includes using toliet, bedpan, or urinal?: A Lot Help from another person bathing (including washing, rinsing, drying)?: A Lot Help from another person to put on and taking off regular upper body clothing?: A Lot Help from  another person to put on  and taking off regular lower body clothing?: A Lot 6 Click Score: 13   End of Session Equipment Utilized During Treatment: Gait belt Nurse Communication: Mobility status;Other (comment)(O2 removed)  Activity Tolerance: Patient tolerated treatment well Patient left: in bed;with call bell/phone within reach;with bed alarm set  OT Visit Diagnosis: Unsteadiness on feet (R26.81);Other abnormalities of gait and mobility (R26.89);Hemiplegia and hemiparesis Hemiplegia - Right/Left: Left Hemiplegia - dominant/non-dominant: Non-Dominant Hemiplegia - caused by: Cerebral infarction                Time: 1610-9604 OT Time Calculation (min): 27 min Charges:  OT General Charges $OT Visit: 1 Visit OT Evaluation $OT Eval Moderate Complexity: 1 Mod  Sherryl Manges OTR/L Acute Rehabilitation Services Pager: [phone] Office: [phone]  Evern Bio Oksana Deberry 11/18/2017, 10:42 AM

## 2017-11-18 NOTE — Progress Notes (Signed)
Progress Note  Patient Name: Chad Franklin Date of Encounter: 11/18/2017  Primary Cardiologist: [facility] to [facility]   Subjective   Denies chest pain or palpitations. No other concerns this morning.  Inpatient Medications    Scheduled Meds: .  stroke: mapping our early stages of recovery book   Does not apply Once  . aspirin EC  81 mg Oral Daily  . bisacodyl  10 mg Rectal Once  . carvedilol  6.25 mg Oral BID WC  . furosemide  40 mg Oral Daily  . insulin aspart  0-15 Units Subcutaneous TID WC  . insulin aspart  0-5 Units Subcutaneous QHS  . losartan  25 mg Oral Daily  . multivitamin with minerals  1 tablet Oral Daily  . PARoxetine  20 mg Oral Daily  . pravastatin  10 mg Oral Daily   Continuous Infusions:  PRN Meds: acetaminophen **OR** acetaminophen (TYLENOL) oral liquid 160 mg/5 mL **OR** acetaminophen, ondansetron (ZOFRAN) IV, senna-docusate   Vital Signs    Vitals:   11/18/17 0400 11/18/17 0500 11/18/17 0600 11/18/17 0700  BP: 122/87 124/83 136/83 126/81  Pulse: 97 98 (!) 112 62  Resp: 19 17 (!) 23 17  Temp: 98.7 F (37.1 C)     TempSrc: Oral     SpO2: 95% 96% 96% 96%  Weight:        Intake/Output Summary (Last 24 hours) at 11/18/2017 0747 Last data filed at 11/17/2017 2300 Gross per 24 hour  Intake 720 ml  Output 650 ml  Net 70 ml   Filed Weights   11/14/17 1800  Weight: 104.7 kg   Physical Exam   General: Elderly, pleasant, NAD Skin: Warm, dry, intact  Head: Normocephalic, atraumatic, clear, moist mucus membranes. Neck: Negative for carotid bruits. JVP 8 Lungs:Breathing is unlabored. Clear on inspiration but faint wheeze on expiration.  Cardiovascular: Irregularly irregular with S1 S2. No murmurs, rubs, gallops, or LV heave appreciated. Abdomen: Soft, non-tender, non-distended with normoactive bowel sounds. No obvious abdominal masses. MSK: Strength and tone appear normal for age.  Extremities: Trace LE edema. No clubbing or cyanosis. DP/PT pulses 2+  bilaterally Neuro: Alert and oriented. No focal deficits. No facial asymmetry. MAE spontaneously. Psych: Responds to questions appropriately with normal affect.    Labs    Chemistry Recent Labs  Lab 11/14/17 1812  11/16/17 0052 11/17/17 0315 11/18/17 0250  NA 142   < > 141 139 138  K 3.9   < > 3.6 3.6 3.4*  CL 104   < > 106 103 101  CO2 30  --  24 27 31   GLUCOSE 126*   < > 111* 118* 115*  BUN 14   < > 18 22 28*  CREATININE 1.16   < > 1.15 1.03 0.95  CALCIUM 8.8*  --  8.6* 8.4* 8.3*  PROT 7.4  --   --   --   --   ALBUMIN 3.7  --   --   --   --   AST 21  --   --   --   --   ALT 15  --   --   --   --   ALKPHOS 74  --   --   --   --   BILITOT 1.0  --   --   --   --   GFRNONAA 57*  --  58* >60 >60  GFRAA >60  --  >60 >60 >60  ANIONGAP 8  --  11 9 6    < > = values in this interval not displayed.     Hematology Recent Labs  Lab 11/16/17 0052 11/17/17 0315 11/18/17 0250  WBC 8.6 9.0 8.4  RBC 4.61 4.36 4.09*  HGB 13.5 12.6* 12.0*  HCT 43.9 41.7 38.9*  MCV 95.2 95.6 95.1  MCH 29.3 28.9 29.3  MCHC 30.8 30.2 30.8  RDW 14.2 14.1 13.9  PLT 178 154 144*    Cardiac Enzymes Recent Labs  Lab 11/15/17 1329 11/15/17 1854 11/16/17 0052  TROPONINI <0.03 <0.03 <0.03    Recent Labs  Lab 11/14/17 1814  TROPIPOC 0.01     BNPNo results for input(s): BNP, PROBNP in the last 168 hours.   DDimer No results for input(s): DDIMER in the last 168 hours.   Radiology    Mr Brain Wo Contrast  Result Date: 11/17/2017 CLINICAL DATA:  Stroke follow-up EXAM: MRI HEAD WITHOUT CONTRAST TECHNIQUE: Multiplanar, multiecho pulse sequences of the brain and surrounding structures were obtained without intravenous contrast. COMPARISON:  Head CT from 2 days ago.  Brain MRI head 10/05/2004 FINDINGS: Brain: Cortical and subcortical acute infarct in the posterior right frontal lobe measuring up to 2 cm on axial slices. No additional acute infarct is noted. Remote left parietooccipital cortex infarct  with cortical laminar necrosis and T1 hyperintensity. There is chronic small vessel ischemia in the cerebral white matter with confluent gliosis. Dilated perivascular spaces in the deep gray nuclei. Remote microhemorrhage in the left thalamus. No generalized chronic hemorrhagic injury. Vascular: Major flow voids are preserved Skull and upper cervical spine: Asymmetric left degenerative facet spurring with mild C3-4 anterolisthesis. Sinuses/Orbits: Mild mucosal thickening in the small right maxillary sinus. Presumed retention cysts in the floor of the left maxillary sinus. IMPRESSION: 1. Small acute cortical and subcortical infarct in the posterior right frontal lobe. 2. Remote left parietooccipital infarct. 3. Chronic small vessel ischemia in the cerebral white matter. Electronically Signed   By: Marnee Spring M.D.   On: 11/17/2017 14:54   Nm Myocar Multi W/spect W/wall Motion / Ef  Result Date: 11/17/2017 CLINICAL DATA:  83 year old male with palpitations. Diabetes. Lung disease. EXAM: MYOCARDIAL IMAGING WITH SPECT (REST AND PHARMACOLOGIC-STRESS) GATED LEFT VENTRICULAR WALL MOTION STUDY LEFT VENTRICULAR EJECTION FRACTION TECHNIQUE: Standard myocardial SPECT imaging was performed after resting intravenous injection of 10 mCi Tc-18m tetrofosmin. Subsequently, intravenous infusion of Lexiscan was performed under the supervision of the Cardiology staff. At peak effect of the drug, 30 mCi Tc-22m tetrofosmin was injected intravenously and standard myocardial SPECT imaging was performed. Quantitative gated imaging was also performed to evaluate left ventricular wall motion, and estimate left ventricular ejection fraction. COMPARISON:  None. FINDINGS: Perfusion: Decreased counts within the apical, mid and basilar segment of the inferior wall which are fixed on rest and stress. Decreased counts in the apical segment of the inferolateral wall which are fixed on rest and stress. No evidence reversible ischemia. Wall  Motion: LEFT ventricle is dilated at rest and stress. Global hypokinesia. Left Ventricular Ejection Fraction: 28 % End diastolic volume 183 ml End systolic volume 131 ml IMPRESSION: 1. No reversible ischemia. Fixed defect in the inferolateral wall may represent infarction. Decrease counts in the inferior wall are favored diaphragmatic attenuation. 2. Global hypokinesia. LEFT ventricular dilatation at rest and stress. 3. Left ventricular ejection fraction 28% 4. Non invasive risk stratification*: High (based on low ejection fraction). *2012 Appropriate Use Criteria for Coronary Revascularization Focused Update: J Am Coll Cardiol. 2012;59(9):857-881. http://content.dementiazones.com.aspx?articleid=[email or web address] Electronically Signed   By:  Genevive Bi M.D.   On: 11/17/2017 12:22   Telemetry    11/18/17 atrial fibrillation, rate controlled. Average 70s when sleeping, 90s to 100s when awake/active- Personally Reviewed  ECG    No new tracing as of 11/18/17- Personally Reviewed  Cardiac Studies   Myoview stress: 11/17/17  FINDINGS: Perfusion: Decreased counts within the apical, mid and basilar segment of the inferior wall which are fixed on rest and stress. Decreased counts in the apical segment of the inferolateral wall which are fixed on rest and stress. No evidence reversible ischemia.  Wall Motion: LEFT ventricle is dilated at rest and stress. Global hypokinesia.  Left Ventricular Ejection Fraction: 28 % End diastolic volume 183 ml End systolic volume 131 ml  IMPRESSION: 1. No reversible ischemia. Fixed defect in the inferolateral wall may represent infarction. Decrease counts in the inferior wall are favored diaphragmatic attenuation.  2. Global hypokinesia. LEFT ventricular dilatation at rest and stress.  3. Left ventricular ejection fraction 28%  4. Non invasive risk stratification*: High (based on low ejection Fraction).  Echo 11/15/17 Study Conclusions  - Left  ventricle: The cavity size was mildly dilated. There was   mild focal basal hypertrophy of the septum. Systolic function was   moderately reduced. The estimated ejection fraction was in the   range of 35% to 40%. - Regional wall motion abnormality: Akinesis of the mid anterior   and basal-mid anteroseptal myocardium; hypokinesis of the apical   anterior, mid inferoseptal, apical inferior, apical septal,   apical lateral, and apical myocardium. - Aortic valve: There was trivial regurgitation. - Mitral valve: Calcified annulus. Mildly thickened leaflets .   There was moderate regurgitation. - Left atrium: The atrium was massively dilated. - Right ventricle: The cavity size was moderately dilated. Wall   thickness was normal. Systolic function was mildly reduced. - Right atrium: The atrium was moderately dilated. - Atrial septum: No defect or patent foramen ovale was identified. - Tricuspid valve: There was mild regurgitation. - Pulmonic valve: There was mild regurgitation. - Pulmonary arteries: Systolic pressure was moderately increased.   PA peak pressure: 44 mm Hg (S).  Impressions:  - Reduced LVEF with wall motion abnormalities across the   anteroseptal, anterior, inferoseptal, and apical myocardium.   Consider multivessel CAD vs. takotsubo. Massively dilated LA with   moderate MR. Elevated PASP of 44 mmHg.  Patient Profile     83 y.o. male with a hx of DM2, COPD, permanent atrial fibrillation on chronic Coumadin and prior CVA 2012/2013 who is being seen today for the evaluation of atrial fibrillation with newly decreased LV function and the need for DOAC therapy at the request of Dr. Pearlean Brownie.   Assessment & Plan    1.  Permanent atrial fibrillation on Coumadin: -Patient presented with left-sided weakness on 11/14/2017, found to have right MCA infarct likely secondary to subtherapeutic INR.  He was on chronic Coumadin therapy for permanent atrial fibrillation and found to have an  INR of 1.18 on admission, although reports Coumadin compliance -currently s/p tPA administration  -Carvedilol 6.25mg  twice daily for rate control  -Add Eliquis once cleared through neurology. Would be on full dose given age/renal function/weight. Will defer interpretation of MRI results and appropriate start time to neurology. -CHA2DS2VASc =at least 7. HAS-BLED 4 points, with high risk for major bleeding. If evidence of bleeding, would discuss stopping aspirin. Will defer to neurology given recent stroke, but not mandatory per cardiac perspective.  2.  Right MCA infarct likely secondary to  subtherapeutic INR: -Patient presented with left-sided weakness found to have right MCA infarct secondary to subtherapeutic INR level. -CT head on admission showed multiple old infarcts however nothing acute. MRI with small acute cortical and subcortical infarct in posterior right frontal lobe with remote left parietoccipital infarct. -Per primary team   3. New acute systolic CHF: -Pt underwent Myoview stress test 11/17/17 >>>no reversible ischemia, possible inferolateral scar. Dilation at rest and stress -Echocardiogram performed  with EF of 35 to 40% with regional wall motion abnormality including akinesis of the mid anterior and basal mid anterior septal myocardium, hypokinesis of the apical anterior and mid inferior septal, apical inferior, apical septal, apical lateral and apical myocardium -given recent stroke, recent tPA and long term need for anticoagulation, lack of reversible ischemia on nuclear study, would favor medical management vs. noninvasive testing in the short term. Would consider CT coronary for anatomy (not required urgently) if patient goes into NSR or slower afib (for gating on CT). Unclear whether he would be a candidate for revascularization given recent stroke, high risk of bleeding, and need for long term anticoagulation. Will reassess as he recovers. -on carvedilol, losartan,  furosemide  4.  Hypertension: -Currently normotensive. Will defer to neurology given time window of permissive hypertension.  5.  HLD: -Stable, LDL 66 with a goal of less than 70 -Continue pravastatin  6.  DM2: -Hemoglobin A1c, 5.8 this admission -SSI for glucose control while inpatient  Jodelle Red, MD, PhD [facility]  [address], [address] [place], [place] [address] [phone]

## 2017-11-18 NOTE — Progress Notes (Signed)
Pt transferred to [address], with wife and several bags of belongings. He is wearing his dentures.

## 2017-11-18 NOTE — Consult Note (Signed)
Physical Medicine and Rehabilitation Consult   Reason for Consult: Functional decline due to stroke Referring Physician: Dr. Pearlean Brownie   HPI: Chad Franklin is a 83 y.o. male with history of T2DM, COPD, A. fib on chronic Coumadin, CVA; who was admitted on 11/14/2017 with sudden onset of left-sided weakness.  History taken from chart review, patient, and wife. CTA head neck negative for large vessel occlusion or flow-limiting stenosis, severe left C3/4 neuroforaminal narrowing.  INR at admission 1.1 and he received IV TPA.  2D echo done showing EF of 35 to 40% apical and anteroseptal myocardial akinesis. He had acute onset of vomiting and was negative for bleed.  MRI of brain reviewed, showing, right frontal CVA.  Per report, done revealing small acute cortical/ subcortical infarct in posterior right frontal lobe and remote left parieto-occipital infarct. Dr. Gala Romney consulted for input on the DOAC and recommended Myoview for work-up due to decline in EF and new acute CHF.  Myoview showed EF of 28% with global hypokinesis, no reversible ischemia and fixed inferolateral lateral wall may represent infarct.  A. fib with RVR treated with metoprolol. Therapy evaluations done revealing left-sided weakness with balance deficits, poor awareness of deficits and significant cognitive deficits.  CIR recommended due to functional deficits  Review of Systems  Constitutional: Negative for chills and fever.  HENT: Positive for hearing loss. Negative for ear pain and tinnitus.   Eyes: Negative for blurred vision and double vision.  Respiratory: Positive for shortness of breath. Negative for cough.   Cardiovascular: Positive for chest pain.  Gastrointestinal: Positive for constipation. Negative for heartburn and nausea.  Genitourinary: Negative for dysuria and urgency.  Musculoskeletal: Negative for myalgias.  Skin: Negative for rash.  Neurological: Positive for focal weakness. Negative for dizziness and  headaches.  Psychiatric/Behavioral: The patient is nervous/anxious.   All other systems reviewed and are negative.    Past Medical History:  Diagnosis Date  . Bilateral swelling of feet   . COPD (chronic obstructive pulmonary disease) (HCC)   . Depression   . Diabetes mellitus   . GERD (gastroesophageal reflux disease)   . Palpitations   . Stroke [facility])    2    Past Surgical History:  Procedure Laterality Date  . GALLBLADDER SURGERY      Family History  Problem Relation Age of Onset  . Hypertension Father     Social History:  Married--wife retired and supportive. 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 Illiterate--reports that he is unable to read or write. Per  reports that he has quit smoking. He does not have any smokeless tobacco history on file. He reports that he does not drink alcohol. His drug history is not on file.    Allergies  Allergen Reactions  . Naproxen Sodium Anaphylaxis, Hives, Shortness Of Breath and Swelling   Medications Prior to Admission  Medication Sig Dispense Refill  . acetaminophen (TYLENOL) 500 MG tablet Take 500 mg by mouth 2 (two) times daily.    . furosemide (LASIX) 20 MG tablet Take 40 mg by mouth daily.    Marland Kitchen losartan-hydrochlorothiazide (HYZAAR) 100-12.5 MG tablet Take 1 tablet by mouth daily.    . Multiple Vitamin (MULTIVITAMIN WITH MINERALS) TABS tablet Take 1 tablet by mouth daily.    Marland Kitchen PARoxetine (PAXIL) 20 MG tablet Take 20 mg by mouth daily.    . pravastatin (PRAVACHOL) 10 MG tablet Take 10 mg by mouth daily.    . verapamil (CALAN-SR) 240 MG  CR tablet Take 240 mg by mouth daily.    Marland Kitchen warfarin (COUMADIN) 5 MG tablet Take 2.5-5 mg by mouth See admin instructions. Take 1/2 tablet (2.5mg ) Mondays and 1 tablet (5mg ) every other day of the week.      Home: Home Living Family/patient expects to be discharged to:: Private residence Living Arrangements: Spouse/significant other Available Help at Discharge: Family, Available 24  hours/day Type of Home: House Home Access: Stairs to enter Secretary/administrator of Steps: 3 Home Layout: One level Bathroom Shower/Tub: Engineer, manufacturing systems: Standard Home Equipment: None  Functional History: Prior Function Level of Independence: Independent Functional Status:  Mobility: Bed Mobility Overal bed mobility: Needs Assistance Bed Mobility: Supine to Sit Supine to sit: Min guard, HOB elevated General bed mobility comments: pt with increased time and struggle to pivot to left and reaching with RUE for assist Transfers Overall transfer level: Needs assistance Transfers: Sit to/from Stand Sit to Stand: Min assist General transfer comment: min assist to stand from bed with cues for safety and reaching for surface to sit, as well as cues to back fully to surface Ambulation/Gait Ambulation/Gait assistance: Mod assist, +2 physical assistance Gait Distance (Feet): 35 Feet Assistive device: Rolling walker (2 wheeled) Gait Pattern/deviations: Step-to pattern, Narrow base of support, Decreased weight shift to left General Gait Details: pt leaning left with consistent loss of grip on LUE on RW with assist to maintain hand on RW with lack of proprioception. pt with periods of partial left knee buckling with flexed trunk. Mod assist +2 to control, trunk and balance as well as lines. Pt unaware of deficits and need for assist.  Gait velocity interpretation: <1.8 ft/sec, indicate of risk for recurrent falls    ADL:    Cognition: Cognition Overall Cognitive Status: Impaired/Different from baseline Arousal/Alertness: Awake/alert Orientation Level: Oriented to person, Oriented to place, Oriented to situation Attention: Selective Selective Attention: Impaired Selective Attention Impairment: Verbal basic, Functional basic(selective digit repetition impaired, distracted by sounds) Memory: Impaired Memory Impairment: Storage deficit, Decreased recall of new information,  Decreased short term memory Decreased Short Term Memory: Verbal basic Awareness: Impaired Awareness Impairment: Intellectual impairment Problem Solving: Impaired Problem Solving Impairment: Verbal basic Executive Function: Reasoning Reasoning: Impaired Reasoning Impairment: Verbal basic Safety/Judgment: Impaired Comments: decreased awareness of deficits Cognition Arousal/Alertness: Awake/alert Behavior During Therapy: WFL for tasks assessed/performed Overall Cognitive Status: Impaired/Different from baseline Area of Impairment: Safety/judgement Safety/Judgement: Decreased awareness of deficits, Decreased awareness of safety   Blood pressure (!) 130/103, pulse (!) 109, temperature 99 F (37.2 C), temperature source Oral, resp. rate 17, weight 104.7 kg, SpO2 95 %. Physical Exam  Nursing note and vitals reviewed. Constitutional: He is oriented to person, place, and time. He appears well-developed and well-nourished. Nasal cannula in place.  NAD  HENT:  Head: Normocephalic and atraumatic.  Eyes: EOM are normal. Right eye exhibits no discharge. Left eye exhibits no discharge.  Neck: Normal range of motion. Neck supple.  Cardiovascular:  Irregularly irregular  Respiratory: Effort normal and breath sounds normal.  +[place]  GI: Bowel sounds are normal. He exhibits distension. There is no tenderness.  Musculoskeletal:  No edema or tenderness in extremities  Neurological: He is alert and oriented to person, place, and time.  Soft voice.  Decreased hearing.  Able to state age/DOB but unable to state month--thought that it was spring.  He was able to follow simple motor commands with occasional cues.  Motor: RUE, B/l LE 5/5 proximal to distal LUE: 0/5 proximal to distal No  increase in tone Sensation intact to light touch  Skin: Skin is warm and dry.  Psychiatric: He has a normal mood and affect. His behavior is normal.    Results for orders placed or performed during the hospital  encounter of 11/14/17 (from the past 24 hour(s))  Glucose, capillary     Status: Abnormal   Collection Time: 11/17/17 12:19 PM  Result Value Ref Range   Glucose-Capillary 157 (H) 70 - 99 mg/dL  Glucose, capillary     Status: Abnormal   Collection Time: 11/17/17  4:54 PM  Result Value Ref Range   Glucose-Capillary 107 (H) 70 - 99 mg/dL  Glucose, capillary     Status: Abnormal   Collection Time: 11/17/17  9:00 PM  Result Value Ref Range   Glucose-Capillary 164 (H) 70 - 99 mg/dL  CBC     Status: Abnormal   Collection Time: 11/18/17  2:50 AM  Result Value Ref Range   WBC 8.4 4.0 - 10.5 K/uL   RBC 4.09 (L) 4.22 - 5.81 MIL/uL   Hemoglobin 12.0 (L) 13.0 - 17.0 g/dL   HCT 16.1 (L) 09.6 - 04.5 %   MCV 95.1 78.0 - 100.0 fL   MCH 29.3 26.0 - 34.0 pg   MCHC 30.8 30.0 - 36.0 g/dL   RDW 40.9 81.1 - 91.4 %   Platelets 144 (L) 150 - 400 K/uL  Basic metabolic panel     Status: Abnormal   Collection Time: 11/18/17  2:50 AM  Result Value Ref Range   Sodium 138 135 - 145 mmol/L   Potassium 3.4 (L) 3.5 - 5.1 mmol/L   Chloride 101 98 - 111 mmol/L   CO2 31 22 - 32 mmol/L   Glucose, Bld 115 (H) 70 - 99 mg/dL   BUN 28 (H) 8 - 23 mg/dL   Creatinine, Ser 7.82 0.61 - 1.24 mg/dL   Calcium 8.3 (L) 8.9 - 10.3 mg/dL   GFR calc non Af Amer >60 >60 mL/min   GFR calc Af Amer >60 >60 mL/min   Anion gap 6 5 - 15  Glucose, capillary     Status: Abnormal   Collection Time: 11/18/17  7:48 AM  Result Value Ref Range   Glucose-Capillary 121 (H) 70 - 99 mg/dL   Mr Brain Wo Contrast  Result Date: 11/17/2017 CLINICAL DATA:  Stroke follow-up EXAM: MRI HEAD WITHOUT CONTRAST TECHNIQUE: Multiplanar, multiecho pulse sequences of the brain and surrounding structures were obtained without intravenous contrast. COMPARISON:  Head CT from 2 days ago.  Brain MRI head 10/05/2004 FINDINGS: Brain: Cortical and subcortical acute infarct in the posterior right frontal lobe measuring up to 2 cm on axial slices. No additional acute  infarct is noted. Remote left parietooccipital cortex infarct with cortical laminar necrosis and T1 hyperintensity. There is chronic small vessel ischemia in the cerebral white matter with confluent gliosis. Dilated perivascular spaces in the deep gray nuclei. Remote microhemorrhage in the left thalamus. No generalized chronic hemorrhagic injury. Vascular: Major flow voids are preserved Skull and upper cervical spine: Asymmetric left degenerative facet spurring with mild C3-4 anterolisthesis. Sinuses/Orbits: Mild mucosal thickening in the small right maxillary sinus. Presumed retention cysts in the floor of the left maxillary sinus. IMPRESSION: 1. Small acute cortical and subcortical infarct in the posterior right frontal lobe. 2. Remote left parietooccipital infarct. 3. Chronic small vessel ischemia in the cerebral white matter. Electronically Signed   By: Marnee Spring M.D.   On: 11/17/2017 14:54   Nm Myocar Multi  W/spect W/wall Motion / Ef  Result Date: 11/17/2017 CLINICAL DATA:  83 year old male with palpitations. Diabetes. Lung disease. EXAM: MYOCARDIAL IMAGING WITH SPECT (REST AND PHARMACOLOGIC-STRESS) GATED LEFT VENTRICULAR WALL MOTION STUDY LEFT VENTRICULAR EJECTION FRACTION TECHNIQUE: Standard myocardial SPECT imaging was performed after resting intravenous injection of 10 mCi Tc-44m tetrofosmin. Subsequently, intravenous infusion of Lexiscan was performed under the supervision of the Cardiology staff. At peak effect of the drug, 30 mCi Tc-60m tetrofosmin was injected intravenously and standard myocardial SPECT imaging was performed. Quantitative gated imaging was also performed to evaluate left ventricular wall motion, and estimate left ventricular ejection fraction. COMPARISON:  None. FINDINGS: Perfusion: Decreased counts within the apical, mid and basilar segment of the inferior wall which are fixed on rest and stress. Decreased counts in the apical segment of the inferolateral wall which are fixed  on rest and stress. No evidence reversible ischemia. Wall Motion: LEFT ventricle is dilated at rest and stress. Global hypokinesia. Left Ventricular Ejection Fraction: 28 % End diastolic volume 183 ml End systolic volume 131 ml IMPRESSION: 1. No reversible ischemia. Fixed defect in the inferolateral wall may represent infarction. Decrease counts in the inferior wall are favored diaphragmatic attenuation. 2. Global hypokinesia. LEFT ventricular dilatation at rest and stress. 3. Left ventricular ejection fraction 28% 4. Non invasive risk stratification*: High (based on low ejection fraction). *2012 Appropriate Use Criteria for Coronary Revascularization Focused Update: J Am Coll Cardiol. 2012;59(9):857-881. http://content.dementiazones.com.aspx?articleid=[email or web address] Electronically Signed   By: Genevive Bi M.D.   On: 11/17/2017 12:22    Assessment/Plan: Diagnosis: Posterior right frontal lobe  Labs and images (see above) independently reviewed.  Records reviewed and summated above. Stroke: Continue secondary stroke prophylaxis and Risk Factor Modification listed below:   Antiplatelet therapy:   Blood Pressure Management:  Continue current medication with prn's with permisive HTN per primary team Statin Agent:   Diabetes management:   Left sided hemiparesis: fit for orthosis to prevent contractures (resting hand splint for day, wrist cock up splint at night) Motor recovery: Fluoxetine  1. Does the need for close, 24 hr/day medical supervision in concert with the patient's rehab needs make it unreasonable for this patient to be served in a less intensive setting? Yes  2. Co-Morbidities requiring supervision/potential complications: acute CHF (Monitor in accordance with increased physical activity and avoid UE resistance excercises), T2 DM (Monitor in accordance with exercise and adjust meds as necessary), COPD (monitor RR with increased physical activity), A. fib with RVR (cont meds, monitor HR with  increased activity), history of CVA, hypokalemia (continue to monitor and replete as necessary), ABLA (transfuse if necessary to ensure appropriate perfusion for increased activity tolerance) 3. Due to safety, disease management and patient education, does the patient require 24 hr/day rehab nursing? Yes 4. Does the patient require coordinated care of a physician, rehab nurse, PT (1-2 hrs/day, 5 days/week) and OT (1-2 hrs/day, 5 days/week) to address physical and functional deficits in the context of the above medical diagnosis(es)? Yes Addressing deficits in the following areas: balance, endurance, locomotion, strength, transferring, bathing, dressing, toileting and psychosocial support 5. Can the patient actively participate in an intensive therapy program of at least 3 hrs of therapy per day at least 5 days per week? Yes 6. The potential for patient to make measurable gains while on inpatient rehab is excellent 7. Anticipated functional outcomes upon discharge from inpatient rehab are supervision  with PT, supervision with OT, n/a with SLP. 8. Estimated rehab length of stay to reach the above functional goals  is: 12-16 days. 9. Anticipated D/C setting: Home 10. Anticipated post D/C treatments: HH therapy and Home excercise program 11. Overall Rehab/Functional Prognosis: good  RECOMMENDATIONS: This patient's condition is appropriate for continued rehabilitative care in the following setting: CIR Patient has agreed to participate in recommended program. Yes Note that insurance prior authorization may be required for reimbursement for recommended care.  Comment: Rehab Admissions Coordinator to follow up.   I have personally performed a face to face diagnostic evaluation, including, but not limited to relevant history and physical exam findings, of this patient and developed relevant assessment and plan.  Additionally, I have reviewed and concur with the physician assistant's documentation above.     Maryla Morrow, MD, ABPMR Jacquelynn Cree, PA-C 11/18/2017

## 2017-11-18 NOTE — Progress Notes (Signed)
Report called to [place] on [address].

## 2017-11-18 NOTE — Progress Notes (Signed)
ANTICOAGULATION CONSULT NOTE - Initial Consult  Pharmacy Consult for Eliquis Indication: atrial fibrillation  Allergies  Allergen Reactions  . Naproxen Sodium Anaphylaxis, Hives, Shortness Of Breath and Swelling   Patient Measurements: Weight: 230 lb 13.2 oz (104.7 kg)  Vital Signs: Temp: 99 F (37.2 C) (09/02 0800) Temp Source: Oral (09/02 0800) BP: 133/83 (09/02 1100) Pulse Rate: 92 (09/02 1100)  Labs: Recent Labs    11/15/17 1329 11/15/17 1854  11/16/17 0052 11/17/17 0315 11/18/17 0250  HGB  --   --    < > 13.5 12.6* 12.0*  HCT  --   --   --  43.9 41.7 38.9*  PLT  --   --   --  178 154 144*  CREATININE  --   --   --  1.15 1.03 0.95  TROPONINI <0.03 <0.03  --  <0.03  --   --    < > = values in this interval not displayed.   CrCl cannot be calculated (Unknown ideal weight.).  Assessment: 19 yoM with PMH s/f afib on warfarin PTA, found to have R MCA infarct likely 2/2 subtherapeutic INR (1.18 on admit), although patient reports adherence to warfarin. CHA2DS2VASc = at least 7, HAS-BLED = 4. Pharmacy now consulted to dose Eliquis. Patient weighs 105kg, Scr 0.95.  Goal of Therapy:  Monitor platelets by anticoagulation protocol: Yes   Plan:  Start Eliquis 5mg  PO BID Monitor s/sx of bleeding Pharmacy will sign off, as dose adjustments not anticipated  Erin N. Zigmund Daniel, PharmD PGY2 Infectious Diseases Pharmacy Resident Phone: [phone] 11/18/2017,12:52 PM

## 2017-11-18 NOTE — Progress Notes (Signed)
STROKE TEAM PROGRESS NOTE   SUBJECTIVE (INTERVAL HISTORY) The patient's wife was at the bedside. The patient is feeling much better. Transfer to floor today. Possible CIR candidate.   OBJECTIVE Vitals:   11/18/17 0700 11/18/17 0800 11/18/17 0900 11/18/17 1100  BP: 126/81 (!) 130/103 118/74 133/83  Pulse: 62 (!) 109 98 92  Resp: 17   20  Temp:  99 F (37.2 C)    TempSrc:  Oral    SpO2: 96% 95% (!) 89% 93%  Weight:        CBC:  Recent Labs  Lab 11/14/17 1812  11/17/17 0315 11/18/17 0250  WBC 7.9   < > 9.0 8.4  NEUTROABS 5.1  --   --   --   HGB 14.3   < > 12.6* 12.0*  HCT 46.5   < > 41.7 38.9*  MCV 94.7   < > 95.6 95.1  PLT 222   < > 154 144*   < > = values in this interval not displayed.    Basic Metabolic Panel:  Recent Labs  Lab 11/17/17 0315 11/18/17 0250  NA 139 138  K 3.6 3.4*  CL 103 101  CO2 27 31  GLUCOSE 118* 115*  BUN 22 28*  CREATININE 1.03 0.95  CALCIUM 8.4* 8.3*    Lipid Panel:     Component Value Date/Time   CHOL 116 11/15/2017 0342   TRIG 54 11/15/2017 0342   HDL 39 (L) 11/15/2017 0342   CHOLHDL 3.0 11/15/2017 0342   VLDL 11 11/15/2017 0342   LDLCALC 66 11/15/2017 0342   HgbA1c:  Lab Results  Component Value Date   HGBA1C 5.8 (H) 11/15/2017   Urine Drug Screen: No results found for: LABOPIA, COCAINSCRNUR, LABBENZ, AMPHETMU, THCU, LABBARB  Alcohol Level No results found for: ETH  IMAGING  Ct Angio Head W Or Wo Contrast Ct Angio Neck W Or Wo Contrast 11/14/2017   ADDENDUM:  1 cm sclerotic lesion T1 most compatible with bone island though, if there is a history of cancer, consider bone scan.  11/14/2017  IMPRESSION:   CTA NECK:  1. No hemodynamically significant stenosis ICA's.  2. Patent vertebral arteries without dissection.  3. Severe LEFT C3-4 neural foraminal narrowing.   CTA HEAD:  1. No emergent large vessel occlusion or flow-limiting stenosis.  2. Mild intracranial atherosclerosis. Aortic Atherosclerosis  (ICD10-I70.0). Emphysema (ICD10-J43.9).   Ct Head Code Stroke Wo Contrast 11/14/2017  IMPRESSION:  1. Age indeterminate basal ganglia lacunar infarcts superimposed on old lacunar infarcts.  2. Old small LEFT parietoccipital lobe infarct.  3. Moderate chronic small vessel ischemic changes.  4. ASPECTS is 9.   MRI Brain Wo Contrast 11/17/2017 IMPRESSION: 1. Small acute cortical and subcortical infarct in the posterior right frontal lobe. 2. Remote left parietooccipital infarct. 3. Chronic small vessel ischemia in the cerebral white matter.   Transthoracic Echocardiogram  11/15/2017 Left ventricle: The cavity size was mildly dilated. There was   mild focal basal hypertrophy of the septum. Systolic function was   moderately reduced. The estimated ejection fraction was in the   range of 35% to 40%. - Regional wall motion abnormality: Akinesis of the mid anterior   and basal-mid anteroseptal myocardium; hypokinesis of the apical   anterior, mid inferoseptal, apical inferior, apical septal,   apical lateral, and apical myocardium   Myoview Stress Test 11/17/2017 IMPRESSION: 1. No reversible ischemia. Fixed defect in the inferolateral wall may represent infarction. Decrease counts in the inferior wall are favored diaphragmatic  attenuation.  2. Global hypokinesia. LEFT ventricular dilatation at rest and stress.  3. Left ventricular ejection fraction 28%  4. Non invasive risk stratification*: High (based on low ejection fraction).    PHYSICAL EXAM  Temp:  [98.1 F (36.7 C)-99 F (37.2 C)] 99 F (37.2 C) (09/02 0800) Pulse Rate:  [62-112] 92 (09/02 1100) Resp:  [16-24] 20 (09/02 1100) BP: (81-136)/(57-103) 133/83 (09/02 1100) SpO2:  [86 %-99 %] 93 % (09/02 1100)  General - Well nourished, well developed, in no apparent distress.  Ophthalmologic - fundi not visualized due to noncooperation.  Cardiovascular - irregularly irregular heart rate and rhythm.  Mental Status  -  Level of arousal and orientation to place, and person were intact, however not oriented to time. Language including expression, naming, repetition, comprehension was assessed and found intact.  Cranial Nerves II - XII - II - Visual field intact OU. III, IV, VI - Extraocular movements intact. V - Facial sensation intact bilaterally. VII - subtle left nasolabial fold flattening. VIII - Hearing & vestibular intact bilaterally. X - Palate elevates symmetrically. XI - Chin turning & shoulder shrug intact bilaterally. XII - Tongue protrusion intact.  Motor Strength - The patient's strength was normal in RUE and RLE, however, LUE 3-/5 proximal and distal, LLE 5-/5 proximal and distal and pronator drift was absent.  Bulk was normal and fasciculations were absent.   Motor Tone - Muscle tone was assessed at the neck and appendages and was normal.  Reflexes - The patient's reflexes were symmetrical in all extremities and he had no pathological reflexes.  Sensory - Light touch, temperature/pinprick were assessed and were symmetrical.    Coordination - The patient had normal movements in the right hand with no ataxia or dysmetria.  Tremor was absent.  Gait and Station - deferred.   ASSESSMENT/PLAN Chad Franklin is a 83 y.o. male with history of diabetes mellitus, COPD, afib on coumadin and previous stroke in 2012/2013 presenting with left-sided weakness.  He received IV TPA Thursday 11/14/2017 at 1900 hrs.  Stroke: likely right MCA infarct due to afib with subtherapeutic INR  Resultant  Left hemi-paresis  CT head - Multiple old infarcts - nothing acute.  MRI head -  Small acute cortical and subcortical infarct in the posterior right frontal lobe.  Myoview Stress Test - EF 28%. Non invasive risk stratification*: High (based on low EF).  CTA H&N - no significant stenosis  2D Echo - EF 35-40% akinesis apical and anteroseptal myocardium  LDL - 66  HgbA1c - 5.8  INR 1.1  VTE  prophylaxis - SCDs  Diet - Heart healthy / carb modified with thin liquids.  warfarin daily prior to admission, now on No antithrombotic with 24h of tPA.  Patient counseled to be compliant with his antithrombotic medications  Ongoing aggressive stroke risk factor management  Therapy recommendations: Possible inpatient Rehab.  Disposition:  Pending  Afib with RVR  On coumadin at home, stated compliance  However, INR 1.1 on admission  currently s/p tPA  Start Eliquis today.  RVR - metoprolol 5mg  iv stat  Add metoprolol 25mg  bid for rate control  Hypertension  Stable . Permissive hypertension (OK if < 180/105) but gradually normalize in 5-7 days . Long-term BP goal normotensive  Hyperlipidemia  Lipid lowering medication PTA:  Pravachol 10 mg daily  LDL 66, goal < 70  Current lipid lowering medication: Pravachol 10 mg daily  Continue statin at discharge  Diabetes  HgbA1c 5.8, goal < 7.0  Controlled  SSI  CBG monitoring  Other Stroke Risk Factors  Advanced age  Former cigarette smoker - quit   Hx stroke/TIA in 02/2011 - 03/2011 - CT showed left CMA and right BG old infarcts  Other Active Problems  1 cm sclerotic lesion T1 most compatible with bone island  CHF - improved  Afib with RVR - improved  Mild hypokalemia -> supplement and recheck. Add daily potassium supplement.  Monitor renal function on Lasix.    PLAN  MRI reviewed by Dr Pearlean Brownie -> OK to start Eliquis tomorrow. Stop ASA.  Appreciate Cardiology help.  Transfer to floor.  Possible CIR candidate - seen by Dr Allena Katz.   Chad See PA-C [facility] Pager [phone] 11/18/2017, 12:37 PM   Hospital day # 4 I have personally examined this patient, reviewed notes, independently viewed imaging studies, participated in medical decision making and plan of care.ROS completed by me personally and pertinent positives fully documented  I have made any additions or  clarifications directly to the above note. Agree with note above.   Transfer to floor. D/W Dr Senaida Ores. Long discussion with the patient and his wife about his condition and answered questions. Greater than 50% time during this 35 minute visit was spent on counseling and coordination of care about his stroke and answered questions   Delia Heady, MD Medical Director [facility] Pager: [phone] 11/18/2017 1:02 PM      To contact Stroke Continuity provider, please refer to [email or web address]. After hours, contact General Neurology

## 2017-11-19 ENCOUNTER — Inpatient Hospital Stay (HOSPITAL_COMMUNITY): Payer: Medicare HMO

## 2017-11-19 LAB — BASIC METABOLIC PANEL
Anion gap: 8 (ref 5–15)
BUN: 23 mg/dL (ref 8–23)
CALCIUM: 8.3 mg/dL — AB (ref 8.9–10.3)
CHLORIDE: 101 mmol/L (ref 98–111)
CO2: 30 mmol/L (ref 22–32)
Creatinine, Ser: 0.91 mg/dL (ref 0.61–1.24)
GFR calc Af Amer: >60 mL/min (ref >60)
GFR calc non Af Amer: >60 mL/min (ref >60)
Glucose, Bld: 120 mg/dL — ABNORMAL HIGH (ref 70–99)
Potassium: 3.6 mmol/L (ref 3.5–5.1)
SODIUM: 139 mmol/L (ref 135–145)

## 2017-11-19 LAB — GLUCOSE, CAPILLARY
GLUCOSE-CAPILLARY: 120 mg/dL — AB (ref 70–99)
Glucose-Capillary: 142 mg/dL — ABNORMAL HIGH (ref 70–99)
Glucose-Capillary: 174 mg/dL — ABNORMAL HIGH (ref 70–99)
Glucose-Capillary: 126 mg/dL — ABNORMAL HIGH (ref 70–99)

## 2017-11-19 MED ORDER — LEVALBUTEROL HCL 1.25 MG/0.5ML IN NEBU
1.2500 mg | INHALATION_SOLUTION | Freq: Three times a day (TID) | RESPIRATORY_TRACT | Status: DC
  Administered 2017-11-20: 1.25 mg via RESPIRATORY_TRACT
  Filled 2017-11-19 (×4): qty 0.5

## 2017-11-19 MED ORDER — IPRATROPIUM-ALBUTEROL 0.5-2.5 (3) MG/3ML IN SOLN
3.0000 mL | Freq: Four times a day (QID) | RESPIRATORY_TRACT | Status: DC
  Administered 2017-11-19: 3 mL via RESPIRATORY_TRACT
  Filled 2017-11-19: qty 3

## 2017-11-19 MED ORDER — LEVALBUTEROL HCL 1.25 MG/0.5ML IN NEBU
1.2500 mg | INHALATION_SOLUTION | Freq: Four times a day (QID) | RESPIRATORY_TRACT | Status: DC
  Filled 2017-11-19 (×3): qty 0.5

## 2017-11-19 MED ORDER — IPRATROPIUM BROMIDE 0.02 % IN SOLN
0.5000 mg | Freq: Four times a day (QID) | RESPIRATORY_TRACT | Status: DC
  Administered 2017-11-19: 0.5 mg via RESPIRATORY_TRACT
  Filled 2017-11-19: qty 2.5

## 2017-11-19 MED ORDER — ALBUTEROL SULFATE (2.5 MG/3ML) 0.083% IN NEBU
INHALATION_SOLUTION | RESPIRATORY_TRACT | Status: AC
  Administered 2017-11-19: 2.5 mg via RESPIRATORY_TRACT
  Filled 2017-11-19: qty 3

## 2017-11-19 MED ORDER — CARVEDILOL 12.5 MG PO TABS
12.5000 mg | ORAL_TABLET | Freq: Two times a day (BID) | ORAL | Status: DC
  Administered 2017-11-19 – 2017-11-20 (×3): 12.5 mg via ORAL
  Filled 2017-11-19 (×3): qty 1

## 2017-11-19 MED ORDER — ALBUTEROL SULFATE (2.5 MG/3ML) 0.083% IN NEBU
2.5000 mg | INHALATION_SOLUTION | RESPIRATORY_TRACT | Status: DC | PRN
  Administered 2017-11-19: 2.5 mg via RESPIRATORY_TRACT

## 2017-11-19 MED ORDER — CARVEDILOL 6.25 MG PO TABS
6.2500 mg | ORAL_TABLET | Freq: Once | ORAL | Status: AC
  Administered 2017-11-19: 6.25 mg via ORAL
  Filled 2017-11-19: qty 1

## 2017-11-19 MED ORDER — IPRATROPIUM BROMIDE 0.02 % IN SOLN
0.5000 mg | Freq: Three times a day (TID) | RESPIRATORY_TRACT | Status: DC
  Administered 2017-11-20: 0.5 mg via RESPIRATORY_TRACT
  Filled 2017-11-19: qty 2.5

## 2017-11-19 MED ORDER — IPRATROPIUM BROMIDE 0.02 % IN SOLN: 0.5000 mg | Freq: Four times a day (QID) | RESPIRATORY_TRACT | Status: DC

## 2017-11-19 NOTE — Progress Notes (Addendum)
Progress Note  Patient Name: Chad Franklin Date of Encounter: 11/19/2017  Primary Cardiologist: New to [facility]; Dr. Cristal Deer  Subjective   Patient reports SOB and non-productive cough today. Denies chest pain or palpitations.   Inpatient Medications    Scheduled Meds: .  stroke: mapping our early stages of recovery book   Does not apply Once  . apixaban  5 mg Oral BID  . bisacodyl  10 mg Rectal Once  . carvedilol  6.25 mg Oral BID WC  . furosemide  40 mg Oral Daily  . insulin aspart  0-15 Units Subcutaneous TID WC  . insulin aspart  0-5 Units Subcutaneous QHS  . losartan  25 mg Oral Daily  . multivitamin with minerals  1 tablet Oral Daily  . PARoxetine  20 mg Oral Daily  . potassium chloride  20 mEq Oral Daily  . pravastatin  10 mg Oral Daily   Continuous Infusions:  PRN Meds: acetaminophen **OR** acetaminophen (TYLENOL) oral liquid 160 mg/5 mL **OR** acetaminophen, albuterol, ondansetron (ZOFRAN) IV, senna-docusate   Vital Signs    Vitals:   11/19/17 0006 11/19/17 0442 11/19/17 0855 11/19/17 1034  BP: 110/63 105/79 (!) 132/97   Pulse: (!) 110 (!) 101 (!) 119   Resp: (!) 22 (!) 22 20   Temp: 99.3 F (37.4 C) 98.6 F (37 C) 98 F (36.7 C)   TempSrc: Oral Oral Oral   SpO2: 93% 93% 93% 92%  Weight:        Intake/Output Summary (Last 24 hours) at 11/19/2017 1044 Last data filed at 11/19/2017 0852 Gross per 24 hour  Intake 600 ml  Output 525 ml  Net 75 ml   Filed Weights   11/14/17 1800  Weight: 104.7 kg    Telemetry    Atrial fibrillation with RVR, persistently with HR >120 over the past 8 hours - Personally Reviewed  Physical Exam   GEN: Sitting upright in bed in no acute distress.   Neck: No JVD, no carotid bruits Cardiac: IRIR, no murmurs, rubs, or gallops.  Respiratory: diffuse expiratory wheezing on exam  GI: NABS, Soft, nontender, non-distended  MS: No edema; No deformity. Neuro:  Nonfocal, moving all extremities spontaneously Psych: Normal  affect   Labs    Chemistry Recent Labs  Lab 11/14/17 1812  11/17/17 0315 11/18/17 0250 11/19/17 0452  NA 142   < > 139 138 139  K 3.9   < > 3.6 3.4* 3.6  CL 104   < > 103 101 101  CO2 30   < > 27 31 30   GLUCOSE 126*   < > 118* 115* 120*  BUN 14   < > 22 28* 23  CREATININE 1.16   < > 1.03 0.95 0.91  CALCIUM 8.8*   < > 8.4* 8.3* 8.3*  PROT 7.4  --   --   --   --   ALBUMIN 3.7  --   --   --   --   AST 21  --   --   --   --   ALT 15  --   --   --   --   ALKPHOS 74  --   --   --   --   BILITOT 1.0  --   --   --   --   GFRNONAA 57*   < > >60 >60 >60  GFRAA >60   < > >60 >60 >60  ANIONGAP 8   < > 9  6 8   < > = values in this interval not displayed.     Hematology Recent Labs  Lab 11/16/17 0052 11/17/17 0315 11/18/17 0250  WBC 8.6 9.0 8.4  RBC 4.61 4.36 4.09*  HGB 13.5 12.6* 12.0*  HCT 43.9 41.7 38.9*  MCV 95.2 95.6 95.1  MCH 29.3 28.9 29.3  MCHC 30.8 30.2 30.8  RDW 14.2 14.1 13.9  PLT 178 154 144*    Cardiac Enzymes Recent Labs  Lab 11/15/17 1329 11/15/17 1854 11/16/17 0052  TROPONINI <0.03 <0.03 <0.03    Recent Labs  Lab 11/14/17 1814  TROPIPOC 0.01     BNPNo results for input(s): BNP, PROBNP in the last 168 hours.   DDimer No results for input(s): DDIMER in the last 168 hours.   Radiology    Mr Brain Wo Contrast  Result Date: 11/17/2017 CLINICAL DATA:  Stroke follow-up EXAM: MRI HEAD WITHOUT CONTRAST TECHNIQUE: Multiplanar, multiecho pulse sequences of the brain and surrounding structures were obtained without intravenous contrast. COMPARISON:  Head CT from 2 days ago.  Brain MRI head 10/05/2004 FINDINGS: Brain: Cortical and subcortical acute infarct in the posterior right frontal lobe measuring up to 2 cm on axial slices. No additional acute infarct is noted. Remote left parietooccipital cortex infarct with cortical laminar necrosis and T1 hyperintensity. There is chronic small vessel ischemia in the cerebral white matter with confluent gliosis.  Dilated perivascular spaces in the deep gray nuclei. Remote microhemorrhage in the left thalamus. No generalized chronic hemorrhagic injury. Vascular: Major flow voids are preserved Skull and upper cervical spine: Asymmetric left degenerative facet spurring with mild C3-4 anterolisthesis. Sinuses/Orbits: Mild mucosal thickening in the small right maxillary sinus. Presumed retention cysts in the floor of the left maxillary sinus. IMPRESSION: 1. Small acute cortical and subcortical infarct in the posterior right frontal lobe. 2. Remote left parietooccipital infarct. 3. Chronic small vessel ischemia in the cerebral white matter. Electronically Signed   By: Marnee Spring M.D.   On: 11/17/2017 14:54   Nm Myocar Multi W/spect W/wall Motion / Ef  Result Date: 11/17/2017 CLINICAL DATA:  83 year old male with palpitations. Diabetes. Lung disease. EXAM: MYOCARDIAL IMAGING WITH SPECT (REST AND PHARMACOLOGIC-STRESS) GATED LEFT VENTRICULAR WALL MOTION STUDY LEFT VENTRICULAR EJECTION FRACTION TECHNIQUE: Standard myocardial SPECT imaging was performed after resting intravenous injection of 10 mCi Tc-26m tetrofosmin. Subsequently, intravenous infusion of Lexiscan was performed under the supervision of the Cardiology staff. At peak effect of the drug, 30 mCi Tc-65m tetrofosmin was injected intravenously and standard myocardial SPECT imaging was performed. Quantitative gated imaging was also performed to evaluate left ventricular wall motion, and estimate left ventricular ejection fraction. COMPARISON:  None. FINDINGS: Perfusion: Decreased counts within the apical, mid and basilar segment of the inferior wall which are fixed on rest and stress. Decreased counts in the apical segment of the inferolateral wall which are fixed on rest and stress. No evidence reversible ischemia. Wall Motion: LEFT ventricle is dilated at rest and stress. Global hypokinesia. Left Ventricular Ejection Fraction: 28 % End diastolic volume 183 ml End  systolic volume 131 ml IMPRESSION: 1. No reversible ischemia. Fixed defect in the inferolateral wall may represent infarction. Decrease counts in the inferior wall are favored diaphragmatic attenuation. 2. Global hypokinesia. LEFT ventricular dilatation at rest and stress. 3. Left ventricular ejection fraction 28% 4. Non invasive risk stratification*: High (based on low ejection fraction). *2012 Appropriate Use Criteria for Coronary Revascularization Focused Update: J Am Coll Cardiol. 2012;59(9):857-881. http://content.dementiazones.com.aspx?articleid=[email or web address] Electronically Signed   By: Roseanne Reno  Amil Amen M.D.   On: 11/17/2017 12:22    Cardiac Studies   Myoview stress: 11/17/17  FINDINGS: Perfusion: Decreased counts within the apical, mid and basilar segment of the inferior wall which are fixed on rest and stress. Decreased counts in the apical segment of the inferolateral wall which are fixed on rest and stress. No evidence reversible ischemia.  Wall Motion: LEFT ventricle is dilated at rest and stress. Global hypokinesia.  Left Ventricular Ejection Fraction: 28 % End diastolic volume 183 ml End systolic volume 131 ml  IMPRESSION: 1. No reversible ischemia. Fixed defect in the inferolateral wall may represent infarction. Decrease counts in the inferior wall are favored diaphragmatic attenuation.  2. Global hypokinesia. LEFT ventricular dilatation at rest and stress.  3. Left ventricular ejection fraction 28%  4. Non invasive risk stratification*: High (based on low ejection Fraction).  Echo 11/15/17 Study Conclusions  - Left ventricle: The cavity size was mildly dilated. There was mild focal basal hypertrophy of the septum. Systolic function was moderately reduced. The estimated ejection fraction was in the range of 35% to 40%. - Regional wall motion abnormality: Akinesis of the mid anterior and basal-mid anteroseptal myocardium; hypokinesis of the  apical anterior, mid inferoseptal, apical inferior, apical septal, apical lateral, and apical myocardium. - Aortic valve: There was trivial regurgitation. - Mitral valve: Calcified annulus. Mildly thickened leaflets . There was moderate regurgitation. - Left atrium: The atrium was massively dilated. - Right ventricle: The cavity size was moderately dilated. Wall thickness was normal. Systolic function was mildly reduced. - Right atrium: The atrium was moderately dilated. - Atrial septum: No defect or patent foramen ovale was identified. - Tricuspid valve: There was mild regurgitation. - Pulmonic valve: There was mild regurgitation. - Pulmonary arteries: Systolic pressure was moderately increased. PA peak pressure: 44 mm Hg (S).  Impressions:  - Reduced LVEF with wall motion abnormalities across the anteroseptal, anterior, inferoseptal, and apical myocardium. Consider multivessel CAD vs. takotsubo. Massively dilated LA with moderate MR. Elevated PASP of 44 mmHg.  Patient Profile     83 y.o. male with PMH of DM2, COPD, permanent atrial fibrillation on chronic Coumadin and prior CVA 2012/2013 who is being followed by cardiology for atrial fibrillation with newly decreased LV function and the need for DOAC therapy  Assessment & Plan    1. Permanent atrial fibrillation on coumadin: patient presented with acute right MCA stroke thought to be 2/2 subtherapeutic INR. He was on coumadin for permanent atrial fibrillation and INR was 1.18 on admission despite reported compliance. He received tPA this admission and cardiology was consulted for anticoagulation recommendations. He was transitioned to eliquis 5mg  BID yesterday for CHA2DS2VASc score of at least 7. HR has been poorly controlled today - Will increase coreg to 12.5mg  BID  - Avoid albuterol nebulizers - will switch to ipratropium for wheezing.  - Continue eliquis 5mg  BID for stroke prevention  2. Right MCA stroke:  felt to be 2/2 subtherapeutic INRs. Now transitioned to eliquis 5mg  BID for stroke prevention. - Continue management per neurology.   3. Acute systolic CHF: echo this admission with EF 35-40% with regional wall motion abnormalities. He underwent a NST 11/17/17 which did not show reversible ischemia but revealed possible inferolateral scar. He was recommended for medical management at that time with consideration for additional ischemic evaluation to be made after recovery of recent stroke. Reports SOB today and has diffuse expiratory wheezing on exam.  - Will check a CXR today - Continue carvedilol at high dose  and losartan - Continue po lasix for now - may need to adjust dosing based on CXR  4. COPD: patient reports SOB and non-productive cough today. Has diffuse expiratory wheezing on exam. Was receiving albuterol nebulizers which may be exacerbating #1.  - Continue ipratropium nebulizers - Will check a CXR   5. HTN: BP stable; permissive hypertension per neurology - Continue current regimen  6. HLD: LDL at goal <70 - Continue statin   7. DM type 2: Hgb A1C 5.8 this admission; at goal of <7 - Continue management per primary team.   For questions or updates, please contact [facility] Please consult [email or web address] for contact info under Cardiology/STEMI.      Signed, Beatriz Stallion, PA-C  11/19/2017, 10:44 AM   [phone]  History and all data above reviewed.  Patient examined.  I agree with the findings as above.  The patient was very SOB getting up to the bathroom. HR is also increased.    At this point I will change to Xopenex .    The patient exam reveals [id number]  ,  Lungs: Diffuse expiratory wheezing  ,  Abd:  Positive bowel sounds, no rebound no guarding, Ext No edema  .  All available labs, radiology testing, previous records reviewed. Agree with documented assessment and plan.   Atrial fib:  On Eliquis.  Increase Coreg.  Continue PO Lasix.    Fayrene Fearing Joretta Eads  1:35 PM   11/19/2017

## 2017-11-19 NOTE — Progress Notes (Signed)
Physical Therapy Treatment Patient Details Name: Chad Franklin MRN: [id number] DOB: 12/12/1934 Today's Date: 11/19/2017    History of Present Illness 83 yo admitted with left weakness s/p tPA with CT demonstrating Old small LEFT parietoccipital lobe infarct and MRI pending. PMhx: Afib, DM, COPD, stroke (patient and wife denies this) on Coumadin     PT Comments    Patient progressing well with activities. Tolerated increased ambulation with neuro facilitation techniques for improvements in gait.  Moderate physical assist required for gait and + 2 for safety at this time. CIR remains appropriate. Will continue to see and progress as tolerated.   Follow Up Recommendations  CIR;Supervision/Assistance - 24 hour     Equipment Recommendations       Recommendations for Other Services       Precautions / Restrictions Precautions Precautions: Fall Restrictions Weight Bearing Restrictions: No    Mobility  Bed Mobility               General bed mobility comments: received in chair  Transfers Overall transfer level: Needs assistance Equipment used: 2 person hand held assist Transfers: Sit to/from Stand Sit to Stand: Min assist         General transfer comment: Min assist for sit to stand requiring verbal and tactile cues for left hand placement to push from sit to stand.  Ambulation/Gait Ambulation/Gait assistance: Mod assist Gait Distance (Feet): 40 Feet(x3) Assistive device: 1 person hand held assist(HHA left and rail on right) Gait Pattern/deviations: Step-through pattern;Decreased stride length;Decreased weight shift to right Gait velocity: decreased Gait velocity interpretation: <1.8 ft/sec, indicate of risk for recurrent falls General Gait Details: patient completed multiple trials with varying gait speed and assistance levels. Patient able to initate weight shift with external support on right side however when external support is removed patient is unable to  initate weight shift and requires facilitation. Music utilized for auditory pacing, external visual cues via line on floor for step  control   Stairs             Wheelchair Mobility    Modified Rankin (Stroke Patients Only) Modified Rankin (Stroke Patients Only) Pre-Morbid Rankin Score: No symptoms Modified Rankin: Moderately severe disability     Balance Overall balance assessment: Needs assistance Sitting-balance support: Feet supported Sitting balance-Leahy Scale: Fair     Standing balance support: During functional activity Standing balance-Leahy Scale: Poor Standing balance comment: required UE support in static standing                            Cognition Arousal/Alertness: Awake/alert Behavior During Therapy: WFL for tasks assessed/performed Overall Cognitive Status: Within Functional Limits for tasks assessed                               Problem Solving: Requires verbal cues;Requires tactile cues        Exercises Other Exercises Other Exercises: gait retraining exercises inclusive of 40 ft distance with support and multi modal cues Other Exercises: 40 ft with visual cues via line Other Exercises: 40 ft with auditory cadence Other Exercises: 40 ft with manual facilitation of weight shift Other Exercises: 6 ft anterior UE support with dynamic movements    General Comments        Pertinent Vitals/Pain Pain Assessment: No/denies pain    Home Living  Additional Comments: 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"    Prior Function            PT Goals (current goals can now be found in the care plan section) Acute Rehab PT Goals Patient Stated Goal: return to home PT Goal Formulation: With patient/family Time For Goal Achievement: 11/30/17 Potential to Achieve Goals: Good Progress towards PT goals: Progressing toward goals    Frequency    Min 4X/week      PT Plan Current plan remains appropriate     Co-evaluation     PT goals addressed during session: Mobility/safety with mobility;Balance        AM-PAC PT "6 Clicks" Daily Activity  Outcome Measure  Difficulty turning over in bed (including adjusting bedclothes, sheets and blankets)?: A Little Difficulty moving from lying on back to sitting on the side of the bed? : A Little Difficulty sitting down on and standing up from a chair with arms (e.g., wheelchair, bedside commode, etc,.)?: A Little Help needed moving to and from a bed to chair (including a wheelchair)?: A Lot Help needed walking in hospital room?: A Lot Help needed climbing 3-5 steps with a railing? : A Lot 6 Click Score: 15    End of Session Equipment Utilized During Treatment: Gait belt Activity Tolerance: Patient tolerated treatment well Patient left: in chair;with call bell/phone within reach;with chair alarm set;with family/visitor present Nurse Communication: Mobility status PT Visit Diagnosis: Unsteadiness on feet (R26.81);Other abnormalities of gait and mobility (R26.89);Muscle weakness (generalized) (M62.81);Hemiplegia and hemiparesis Hemiplegia - Right/Left: Left Hemiplegia - dominant/non-dominant: Non-dominant Hemiplegia - caused by: Cerebral infarction     Time: 1448-1856 PT Time Calculation (min) (ACUTE ONLY): 25 min  Charges:  $Gait Training: 8-22 mins $Therapeutic Activity: 8-22 mins                     Chad Franklin, PT DPT  Board Certified Neurologic Specialist [phone]    Chad Franklin 11/19/2017, 5:07 PM

## 2017-11-19 NOTE — PMR Pre-admission (Signed)
PMR Admission Coordinator Pre-Admission Assessment  Patient: Chad Franklin is an 83 y.o., male MRN: [id number] DOB: [birth year] Height:   Weight: 104.7 kg              Insurance Information HMO: yes    PPO:      PCP:      IPA:      80/20:      OTHER: medicare advantage plan PRIMARY: [employer]      Policy#: [id number]      Subscriber: pt CM Name: Dot      Phone#: [phone] ext [phone]    Fax#: [phone] Pre-Cert#: [id number]    Approved for 7 days with f/u Karle Barr. Phone ext [phone]  Employer: self employed Benefits:  Phone #: [phone]     Name: 11/19/2017 Eff. Date: 03/19/2017     Deduct: none      Out of Pocket Max: $3400      Life Max: none CIR: $295 co pay per day days 1 until 6      SNF: no co pay days 1 until 20; $172 co pay per day days 21 until 100 Outpatient: $40 copy per visit     Co-Pay: visits per medical neccesity Home Health: 100%      Co-Pay: visits per medical neccesity DME: 80%     Co-Pay: 20 % Providers: in network  SECONDARY: none       Medicaid Application Date:       Case Manager:  Disability Application Date:       Case Worker:   Emergency Conservator, museum/gallery Information    Name Relation Home Work Mobile   HAKIM, MINNIEFIELD  [phone]       Current Medical History  Patient Admitting Diagnosis: right MCA infarct  History of Present Illness:  Chad Franklin is a 83 y.o. male with history of T2DM, COPD, A. fib on chronic Coumadin, CVA; who was admitted on 11/14/2017 with sudden onset of left-sided weakness.   CTA head neck negative for large vessel occlusion or flow-limiting stenosis, severe left C3/4 neuro foraminal narrowing.  INR at admission 1.1 and he received IV TPA.  2D echo done showing EF of 35 to 40% apical and anteroseptal myocardial akinesis. He had acute onset of vomiting and was negative for bleed.  MRI of brain reviewed, showing, right frontal CVA.  Per report, done revealing small acute cortical/ subcortical infarct in posterior  right frontal lobe and remote left parieto-occipital infarct. Dr. Gala Romney consulted for input on the DOAC and recommended Myoview for work-up due to decline in EF and new acute CHF.  Myoview showed EF of 28% with global hypokinesis, no reversible ischemia and fixed inferolateral lateral wall may represent infarct.  A. fib with RVR treated with metoprolol. Cardiology consulted with med changes to coreg in 9/3. recommend avoid albuterol nebulizer. Plan to switch to ipratropium for wheezing. Continue po lasix.   Complete NIHSS TOTAL: 7    Past Medical History  Past Medical History:  Diagnosis Date  . Bilateral swelling of feet   . COPD (chronic obstructive pulmonary disease) (HCC)   . Depression   . Diabetes mellitus   . GERD (gastroesophageal reflux disease)   . Palpitations   . Stroke [facility])    2    Family History  family history includes Hypertension in his father.  Prior Rehab/Hospitalizations:  Has the patient had major surgery during 100 days prior to admission? No  Current Medications   Current Facility-Administered Medications:  .  stroke: mapping our early stages of recovery book, , Does not apply, Once, Aroor, Georgiana Spinner R, MD .  acetaminophen (TYLENOL) tablet 650 mg, 650 mg, Oral, Q4H PRN, 650 mg at 11/20/17 0554 **OR** acetaminophen (TYLENOL) solution 650 mg, 650 mg, Per Tube, Q4H PRN **OR** acetaminophen (TYLENOL) suppository 650 mg, 650 mg, Rectal, Q4H PRN, Aroor, Georgiana Spinner R, MD .  apixaban (ELIQUIS) tablet 5 mg, 5 mg, Oral, BID, Marvel Plan, MD, 5 mg at 11/20/17 0831 .  bisacodyl (DULCOLAX) suppository 10 mg, 10 mg, Rectal, Once, Marvel Plan, MD .  carvedilol (COREG) tablet 12.5 mg, 12.5 mg, Oral, BID WC, Kroeger, Krista M., PA-C, 12.5 mg at 11/20/17 0831 .  digoxin (LANOXIN) tablet 0.125 mg, 0.125 mg, Oral, Daily, Hochrein, James, MD, 0.125 mg at 11/20/17 1147 .  furosemide (LASIX) tablet 40 mg, 40 mg, Oral, Daily, Duke Salvia, MD, 40 mg at 11/20/17 0831 .  insulin  aspart (novoLOG) injection 0-15 Units, 0-15 Units, Subcutaneous, TID WC, Marvel Plan, MD, 2 Units at 11/20/17 1147 .  insulin aspart (novoLOG) injection 0-5 Units, 0-5 Units, Subcutaneous, QHS, Xu, Jindong, MD .  ipratropium (ATROVENT) nebulizer solution 0.5 mg, 0.5 mg, Nebulization, Q6H PRN, Micki Riley, MD .  levalbuterol (XOPENEX) nebulizer solution 1.25 mg, 1.25 mg, Nebulization, Q6H PRN, Pearlean Brownie, Pramod S, MD .  losartan (COZAAR) tablet 25 mg, 25 mg, Oral, Daily, Bensimhon, Bevelyn Buckles, MD, 25 mg at 11/20/17 0831 .  multivitamin with minerals tablet 1 tablet, 1 tablet, Oral, Daily, Marvel Plan, MD, 1 tablet at 11/20/17 0831 .  ondansetron (ZOFRAN) injection 4 mg, 4 mg, Intravenous, Q4H PRN, Marvel Plan, MD, 4 mg at 11/15/17 1508 .  PARoxetine (PAXIL) tablet 20 mg, 20 mg, Oral, Daily, Marvel Plan, MD, 20 mg at 11/20/17 0762 .  potassium chloride SA (K-DUR,KLOR-CON) CR tablet 20 mEq, 20 mEq, Oral, Daily, Rinehuls, David L, PA-C, 20 mEq at 11/20/17 0833 .  pravastatin (PRAVACHOL) tablet 10 mg, 10 mg, Oral, Daily, Marvel Plan, MD, 10 mg at 11/20/17 2633 .  senna-docusate (Senokot-S) tablet 1 tablet, 1 tablet, Oral, QHS PRN, Aroor, Dara Lords, MD, 1 tablet at 11/19/17 2128  Patients Current Diet:  Diet Order            Diet - low sodium heart healthy        Diet heart healthy/carb modified Room service appropriate? 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  Independent  Indoor Mobility: Did the patient need assistance with walking from room to room (with or without device)? Independent  Stairs: Did the patient need assistance with internal or external stairs (with or without device)? Independent  Functional Cognition: Did the patient need help planning regular tasks such as shopping or remembering to take  medications? Independent  Current Functional Level Cognition  Arousal/Alertness: Awake/alert Overall Cognitive Status: Within Functional Limits for tasks assessed Orientation Level: Oriented X4 Safety/Judgement: Decreased awareness of deficits, Decreased awareness of safety General Comments: Some intermittent cues required throughout session and during functional task performance Attention: Selective Selective Attention: Impaired Selective Attention Impairment: Verbal basic, Functional basic(selective digit repetition impaired, distracted by sounds) Memory: Impaired Memory Impairment: Storage deficit, Decreased recall of new information, Decreased short term memory Decreased Short Term Memory: Verbal basic Awareness: Impaired Awareness Impairment: Intellectual impairment Problem Solving: Impaired Problem Solving Impairment: Verbal basic Executive Function: Reasoning Reasoning: Impaired Reasoning Impairment: Verbal basic Safety/Judgment: Impaired Comments: decreased awareness of deficits    Extremity Assessment (includes Sensation/Coordination)  Upper Extremity Assessment: LUE deficits/detail LUE Deficits / Details: shoulder and elbow 2+/5, wrist and hand 1/5, sensation intact LUE Sensation: WNL LUE Coordination: decreased fine motor, decreased gross motor  Lower Extremity Assessment:  Defer to PT evaluation RLE Deficits / Details: WNL LLE Deficits / Details: hip flexion 4/5, hip abduction/ADD 5/5, knee extension 5/5. Pt with functional strength and sensation with testing but with mobility lack of coordination and control of LLE noted LLE Sensation: WNL LLE Coordination: WNL    ADLs  Overall ADL's : Needs assistance/impaired Eating/Feeding: Moderate assistance Eating/Feeding Details (indicate cue type and reason): assist for BUE tasks (cutting) educated on using RUE to place LUE in helpful position Grooming: Wash/dry hands, Wash/dry face, Brushing hair, Moderate assistance, Standing Grooming Details (indicate cue type and reason): mod A and cues for standing balance; Pt attempting to use RUE to engage LUE in tasks - perfoming most single handledly (wringing out wash cloth, using wash cloth to wash LUE) Upper Body Bathing: Minimal assistance, Sitting Lower Body Bathing: Moderate assistance, Sit to/from stand Upper Body Dressing : Maximal assistance, Sitting Upper Body Dressing Details (indicate cue type and reason): EOB to don hospital gown like robe Lower Body Dressing: Sitting/lateral leans, Maximal assistance Lower Body Dressing Details (indicate cue type and reason): to don socks Toilet Transfer: Moderate assistance, +2 for physical assistance, +2 for safety/equipment, Ambulation Toilet Transfer Details (indicate cue type and reason): 2 person HHA Toileting- Clothing Manipulation and Hygiene: Moderate assistance, Sitting/lateral lean Functional mobility during ADLs: Moderate assistance, +2 for physical assistance, +2 for safety/equipment(2 person HHA)    Mobility  Overal bed mobility: Needs Assistance Bed Mobility: Supine to Sit, Sit to Supine Supine to sit: Min assist Sit to supine: Min assist General bed mobility comments: received in chair    Transfers  Overall transfer level: Needs assistance Equipment used: 2 person hand held assist Transfers: Sit to/from  Stand Sit to Stand: Min assist General transfer comment: Min assist for sit to stand requiring verbal and tactile cues for left hand placement to push from sit to stand.    Ambulation / Gait / Stairs / Wheelchair Mobility  Ambulation/Gait Ambulation/Gait assistance: Mod assist Gait Distance (Feet): 40 Feet(x3) Assistive device: 1 person hand held assist(HHA left and rail on right) Gait Pattern/deviations: Step-through pattern, Decreased stride length, Decreased weight shift to right General Gait Details: patient completed multiple trials with varying gait speed and assistance levels. Patient able to initate weight shift with external support on right side however when external support is removed patient is unable to initate weight shift and requires facilitation. Music utilized for auditory pacing, external visual cues via line on floor for step  control Gait velocity: decreased Gait velocity interpretation: <1.8 ft/sec, indicate of risk for recurrent falls    Posture /  Balance Dynamic Sitting Balance Sitting balance - Comments: patient able to perform static sitting without difficulty, some modest challenge tolerated Balance Overall balance assessment: Needs assistance Sitting-balance support: Feet supported Sitting balance-Leahy Scale: Fair Sitting balance - Comments: patient able to perform static sitting without difficulty, some modest challenge tolerated Postural control: Right lateral lean Standing balance support: During functional activity Standing balance-Leahy Scale: Poor Standing balance comment: required UE support in static standing    Special needs/care consideration BiPAP/CPAP  N/a CPM  N/a Continuous Drip IV n/a Dialysis  N/a Life Vest n/a Oxygen n/a Special Bed n/a Trach Size n/a Wound Vac n/a Skin tear to right arm Bowel mgmt: continent LBM 9/3 Bladder mgmt: external catheter Diabetic mgmt Hgb A1c 5.8   Previous Home Environment Living Arrangements:  Spouse/significant other  Lives With: Spouse Available Help at Discharge: Family, Available 24 hours/day Type of Home: House Home Layout: One level Home Access: Stairs to enter Secretary/administrator of Steps: 3 Bathroom Shower/Tub: Engineer, manufacturing systems: Standard Bathroom Accessibility: Yes How Accessible: Accessible via walker Home Care Services: No Additional Comments: 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"  Discharge Living Setting Plans for Discharge Living Setting: Patient's home, Lives with (comment)(wife) Type of Home at Discharge: House Discharge Home Layout: One level Discharge Home Access: Stairs to enter Entrance Stairs-Rails: None Entrance Stairs-Number of Steps: 3 Discharge Bathroom Shower/Tub: Tub/shower unit, Curtain Discharge Bathroom Toilet: Standard Discharge Bathroom Accessibility: Yes How Accessible: Accessible via walker Does the patient have any problems obtaining your medications?: No  Social/Family/Support Systems Patient Roles: Spouse, 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 Information: Wilma, wife Anticipated Caregiver: wife and family Anticipated Industrial/product designer Information: see above Ability/Limitations of Caregiver: none Caregiver Availability: 24/7 Discharge Plan Discussed with Primary Caregiver: Yes Is Caregiver In Agreement with Plan?: Yes Does Caregiver/Family have Issues with Lodging/Transportation while Pt is in Rehab?: No  Goals/Additional Needs Patient/Family Goal for Rehab: supervision with PT, OT, and SLP Expected length of stay: ELOS 12 to 16 days Pt/Family Agrees to Admission and willing to participate: Yes Program Orientation Provided & Reviewed with Pt/Caregiver Including Roles  & Responsibilities: Yes  Decrease burden of Care through IP rehab admission: n/a  Possible need for SNF placement upon discharge: not anticipated  Patient Condition: This patient's condition remains as documented in the consult dated  11/18/2017, in which the Rehabilitation Physician determined and documented that the patient's condition is appropriate for intensive rehabilitative care in an inpatient rehabilitation facility. Will admit to inpatient rehab today.  Preadmission Screen Completed By:  Clois Dupes, 11/20/2017 4:11 PM ______________________________________________________________________   Discussed status with Dr. Allena Katz on 11/20/2017 at  1610 and received telephone approval for admission today.  Admission Coordinator:  Clois Dupes, time 6045 Date 11/20/2017

## 2017-11-19 NOTE — Discharge Instructions (Signed)

## 2017-11-19 NOTE — Progress Notes (Signed)
  Speech Language Pathology Treatment: Cognitive-Linquistic  Patient Details Name: Chad Franklin MRN: [id number] DOB: [birth year] Today's Date: 11/19/2017 Time: 9024-0973 SLP Time Calculation (min) (ACUTE ONLY): 13 min  Assessment / Plan / Recommendation Clinical Impression  Pt demonstrates improved focused and sustained attention since initial assessment - improved ability to recall task instructions, improved intellectual awareness of deficits (physical >cognitive).  Pt able to attend to task and filter extraneous environmental noise independently.  Persisting deficits in working memory, exacerbated from baseline.  Reviewed purpose of rehab therapies after queried by pt - recommend further cognitive assessment upon D/C to CIR. Pt eager to begin therapies.  HPI HPI: Chad Franklin is a 83 y.o. male with history of diabetes mellitus, COPD, afib on coumadin and previous stroke in 2012/2013 presenting with left-sided weakness, s/p TPA. CT head 11/14/17: age indeterminate basal ganglia lacunar infarcts superimposed on old lacunar infarcts, old small LEFT parietoccipital lobe infarct. MRI Small acute cortical and subcortical infarct in the posterior right frontal lobe      SLP Plan  Continue with current plan of care       Recommendations                   Oral Care Recommendations: Oral care BID Follow up Recommendations: Inpatient Rehab SLP Visit Diagnosis: Cognitive communication deficit (Z32.992) Plan: Continue with current plan of care       GO                Blenda Mounts Laurice 11/19/2017, 11:59 AM

## 2017-11-19 NOTE — Care Management Important Message (Signed)
Important Message  Patient Details  Name: Chad Franklin MRN: [id number] Date of Birth: 04/02/1934   Medicare Important Message Given:  Yes    Dorena Bodo 11/19/2017, 4:05 PM

## 2017-11-19 NOTE — Progress Notes (Signed)
Inpatient Rehabilitation Admissions Coordinator  I met with patient, his wife, and a daughter at bedside. We discussed goals and expectations of an inpt rehab admit. They are in agreement. I will begin insurance authorization with [facility]. I will follow up tomorrow.   Danne Baxter, RN, MSN Rehab Admissions Coordinator [phone] 11/19/2017 12:11 PM

## 2017-11-20 ENCOUNTER — Encounter (HOSPITAL_COMMUNITY): Payer: Self-pay | Admitting: Neurology

## 2017-11-20 ENCOUNTER — Inpatient Hospital Stay (HOSPITAL_COMMUNITY)
Admission: RE | Admit: 2017-11-20 | Discharge: 2017-12-04 | DRG: 056 | Disposition: A | Discharge status: Home Health | Payer: Medicare HMO | Source: Intra-hospital | Attending: Physical Medicine & Rehabilitation | Admitting: Physical Medicine & Rehabilitation

## 2017-11-20 DIAGNOSIS — R7309 Other abnormal glucose: Secondary | ICD-10-CM

## 2017-11-20 DIAGNOSIS — I69319 Unspecified symptoms and signs involving cognitive functions following cerebral infarction: Secondary | ICD-10-CM

## 2017-11-20 DIAGNOSIS — K219 Gastro-esophageal reflux disease without esophagitis: Secondary | ICD-10-CM | POA: Diagnosis present

## 2017-11-20 DIAGNOSIS — Z886 Allergy status to analgesic agent status: Secondary | ICD-10-CM

## 2017-11-20 DIAGNOSIS — I5021 Acute systolic (congestive) heart failure: Secondary | ICD-10-CM | POA: Diagnosis present

## 2017-11-20 DIAGNOSIS — Z87891 Personal history of nicotine dependence: Secondary | ICD-10-CM

## 2017-11-20 DIAGNOSIS — Z6835 Body mass index (BMI) 35.0-35.9, adult: Secondary | ICD-10-CM | POA: Diagnosis not present

## 2017-11-20 DIAGNOSIS — I69354 Hemiplegia and hemiparesis following cerebral infarction affecting left non-dominant side: Principal | ICD-10-CM

## 2017-11-20 DIAGNOSIS — Z79899 Other long term (current) drug therapy: Secondary | ICD-10-CM

## 2017-11-20 DIAGNOSIS — I110 Hypertensive heart disease with heart failure: Secondary | ICD-10-CM | POA: Diagnosis present

## 2017-11-20 DIAGNOSIS — E46 Unspecified protein-calorie malnutrition: Secondary | ICD-10-CM | POA: Diagnosis not present

## 2017-11-20 DIAGNOSIS — Z7901 Long term (current) use of anticoagulants: Secondary | ICD-10-CM

## 2017-11-20 DIAGNOSIS — D62 Acute posthemorrhagic anemia: Secondary | ICD-10-CM | POA: Diagnosis present

## 2017-11-20 DIAGNOSIS — I10 Essential (primary) hypertension: Secondary | ICD-10-CM | POA: Diagnosis not present

## 2017-11-20 DIAGNOSIS — I639 Cerebral infarction, unspecified: Secondary | ICD-10-CM

## 2017-11-20 DIAGNOSIS — G479 Sleep disorder, unspecified: Secondary | ICD-10-CM

## 2017-11-20 DIAGNOSIS — I482 Chronic atrial fibrillation: Secondary | ICD-10-CM | POA: Diagnosis present

## 2017-11-20 DIAGNOSIS — J449 Chronic obstructive pulmonary disease, unspecified: Secondary | ICD-10-CM | POA: Diagnosis not present

## 2017-11-20 DIAGNOSIS — E876 Hypokalemia: Secondary | ICD-10-CM

## 2017-11-20 DIAGNOSIS — F329 Major depressive disorder, single episode, unspecified: Secondary | ICD-10-CM | POA: Diagnosis present

## 2017-11-20 DIAGNOSIS — E8809 Other disorders of plasma-protein metabolism, not elsewhere classified: Secondary | ICD-10-CM | POA: Diagnosis present

## 2017-11-20 DIAGNOSIS — G4700 Insomnia, unspecified: Secondary | ICD-10-CM | POA: Diagnosis present

## 2017-11-20 DIAGNOSIS — Z713 Dietary counseling and surveillance: Secondary | ICD-10-CM | POA: Diagnosis not present

## 2017-11-20 DIAGNOSIS — E1169 Type 2 diabetes mellitus with other specified complication: Secondary | ICD-10-CM

## 2017-11-20 DIAGNOSIS — I481 Persistent atrial fibrillation: Secondary | ICD-10-CM | POA: Diagnosis not present

## 2017-11-20 DIAGNOSIS — Z8249 Family history of ischemic heart disease and other diseases of the circulatory system: Secondary | ICD-10-CM | POA: Diagnosis not present

## 2017-11-20 DIAGNOSIS — E669 Obesity, unspecified: Secondary | ICD-10-CM | POA: Diagnosis not present

## 2017-11-20 DIAGNOSIS — I4891 Unspecified atrial fibrillation: Secondary | ICD-10-CM | POA: Diagnosis not present

## 2017-11-20 DIAGNOSIS — Z8659 Personal history of other mental and behavioral disorders: Secondary | ICD-10-CM

## 2017-11-20 DIAGNOSIS — E119 Type 2 diabetes mellitus without complications: Secondary | ICD-10-CM | POA: Diagnosis present

## 2017-11-20 DIAGNOSIS — Z23 Encounter for immunization: Secondary | ICD-10-CM | POA: Diagnosis not present

## 2017-11-20 LAB — BASIC METABOLIC PANEL
ANION GAP: 10 (ref 5–15)
BUN: 19 mg/dL (ref 8–23)
CO2: 27 mmol/L (ref 22–32)
Calcium: 8.3 mg/dL — ABNORMAL LOW (ref 8.9–10.3)
Chloride: 102 mmol/L (ref 98–111)
Creatinine, Ser: 0.83 mg/dL (ref 0.61–1.24)
GFR calc Af Amer: >60 mL/min (ref >60)
GFR calc non Af Amer: >60 mL/min (ref >60)
GLUCOSE: 124 mg/dL — AB (ref 70–99)
POTASSIUM: 3.7 mmol/L (ref 3.5–5.1)
Sodium: 139 mmol/L (ref 135–145)

## 2017-11-20 LAB — GLUCOSE, CAPILLARY
GLUCOSE-CAPILLARY: 115 mg/dL — AB (ref 70–99)
GLUCOSE-CAPILLARY: 123 mg/dL — AB (ref 70–99)
GLUCOSE-CAPILLARY: 112 mg/dL — AB (ref 70–99)
Glucose-Capillary: 132 mg/dL — ABNORMAL HIGH (ref 70–99)

## 2017-11-20 MED ORDER — TRAZODONE HCL 50 MG PO TABS
50.0000 mg | ORAL_TABLET | Freq: Every day | ORAL | Status: DC
  Administered 2017-11-20: 50 mg via ORAL
  Filled 2017-11-20: qty 1

## 2017-11-20 MED ORDER — FUROSEMIDE 40 MG PO TABS
40.0000 mg | ORAL_TABLET | Freq: Every day | ORAL | Status: DC
  Administered 2017-11-21 – 2017-12-04 (×14): 40 mg via ORAL
  Filled 2017-11-20 (×14): qty 1

## 2017-11-20 MED ORDER — BISACODYL 10 MG RE SUPP: 10.0000 mg | Freq: Every day | RECTAL | Status: DC | PRN

## 2017-11-20 MED ORDER — LEVALBUTEROL HCL 1.25 MG/0.5ML IN NEBU: 1.2500 mg | INHALATION_SOLUTION | Freq: Four times a day (QID) | RESPIRATORY_TRACT | 12 refills | Status: DC | PRN

## 2017-11-20 MED ORDER — PROCHLORPERAZINE MALEATE 5 MG PO TABS: 5.0000 mg | ORAL_TABLET | Freq: Four times a day (QID) | ORAL | Status: DC | PRN

## 2017-11-20 MED ORDER — SENNOSIDES-DOCUSATE SODIUM 8.6-50 MG PO TABS: 1.0000 | ORAL_TABLET | Freq: Every evening | ORAL | Status: DC | PRN

## 2017-11-20 MED ORDER — PROCHLORPERAZINE 25 MG RE SUPP: 12.5000 mg | Freq: Four times a day (QID) | RECTAL | Status: DC | PRN

## 2017-11-20 MED ORDER — CARVEDILOL 12.5 MG PO TABS: 12.5000 mg | ORAL_TABLET | Freq: Two times a day (BID) | ORAL | Status: DC

## 2017-11-20 MED ORDER — ACETAMINOPHEN 325 MG PO TABS
325.0000 mg | ORAL_TABLET | ORAL | Status: DC | PRN
  Administered 2017-11-22 – 2017-11-24 (×3): 650 mg via ORAL
  Filled 2017-11-20 (×3): qty 2

## 2017-11-20 MED ORDER — FLEET ENEMA 7-19 GM/118ML RE ENEM: 1.0000 | ENEMA | Freq: Once | RECTAL | Status: DC | PRN

## 2017-11-20 MED ORDER — PROCHLORPERAZINE EDISYLATE 10 MG/2ML IJ SOLN: 5.0000 mg | Freq: Four times a day (QID) | INTRAMUSCULAR | Status: DC | PRN

## 2017-11-20 MED ORDER — LEVALBUTEROL HCL 1.25 MG/0.5ML IN NEBU
1.2500 mg | INHALATION_SOLUTION | Freq: Four times a day (QID) | RESPIRATORY_TRACT | Status: DC | PRN
  Administered 2017-11-21: 1.25 mg via RESPIRATORY_TRACT
  Filled 2017-11-20 (×2): qty 0.5

## 2017-11-20 MED ORDER — INFLUENZA VAC SPLIT HIGH-DOSE 0.5 ML IM SUSY
0.5000 mL | PREFILLED_SYRINGE | INTRAMUSCULAR | Status: AC
  Administered 2017-11-21: 0.5 mL via INTRAMUSCULAR
  Filled 2017-11-20: qty 0.5

## 2017-11-20 MED ORDER — IPRATROPIUM BROMIDE 0.02 % IN SOLN
0.5000 mg | Freq: Four times a day (QID) | RESPIRATORY_TRACT | Status: DC | PRN
  Administered 2017-11-21 (×2): 0.5 mg via RESPIRATORY_TRACT
  Filled 2017-11-20 (×2): qty 2.5

## 2017-11-20 MED ORDER — ADULT MULTIVITAMIN W/MINERALS CH: 1.0000 | ORAL_TABLET | Freq: Every day | ORAL | Status: DC

## 2017-11-20 MED ORDER — BISACODYL 10 MG RE SUPP: 10.0000 mg | Freq: Once | RECTAL | 0 refills | Status: DC

## 2017-11-20 MED ORDER — PRAVASTATIN SODIUM 20 MG PO TABS
10.0000 mg | ORAL_TABLET | Freq: Every day | ORAL | Status: DC
  Administered 2017-11-21 – 2017-12-04 (×14): 10 mg via ORAL
  Filled 2017-11-20 (×14): qty 1

## 2017-11-20 MED ORDER — ONDANSETRON HCL 4 MG/2ML IJ SOLN: 4.0000 mg | INTRAMUSCULAR | 0 refills | Status: DC | PRN

## 2017-11-20 MED ORDER — INSULIN ASPART 100 UNIT/ML ~~LOC~~ SOLN
0.0000 [IU] | Freq: Three times a day (TID) | SUBCUTANEOUS | Status: DC
  Administered 2017-11-22 – 2017-11-28 (×5): 2 [IU] via SUBCUTANEOUS
  Administered 2017-11-28: 3 [IU] via SUBCUTANEOUS
  Administered 2017-11-29 – 2017-12-03 (×6): 2 [IU] via SUBCUTANEOUS

## 2017-11-20 MED ORDER — APIXABAN 5 MG PO TABS: 5.0000 mg | ORAL_TABLET | Freq: Two times a day (BID) | ORAL | Status: DC

## 2017-11-20 MED ORDER — IPRATROPIUM BROMIDE 0.02 % IN SOLN: 0.5000 mg | Freq: Four times a day (QID) | RESPIRATORY_TRACT | 12 refills | Status: DC | PRN

## 2017-11-20 MED ORDER — CARVEDILOL 12.5 MG PO TABS
12.5000 mg | ORAL_TABLET | Freq: Two times a day (BID) | ORAL | Status: DC
  Administered 2017-11-21 – 2017-11-22 (×3): 12.5 mg via ORAL
  Filled 2017-11-20 (×3): qty 1

## 2017-11-20 MED ORDER — APIXABAN 5 MG PO TABS
5.0000 mg | ORAL_TABLET | Freq: Two times a day (BID) | ORAL | Status: DC
  Administered 2017-11-20 – 2017-12-04 (×28): 5 mg via ORAL
  Filled 2017-11-20 (×28): qty 1

## 2017-11-20 MED ORDER — DIGOXIN 125 MCG PO TABS
0.1250 mg | ORAL_TABLET | Freq: Every day | ORAL | Status: DC
  Administered 2017-11-21 – 2017-12-04 (×14): 0.125 mg via ORAL
  Filled 2017-11-20 (×14): qty 1

## 2017-11-20 MED ORDER — POTASSIUM CHLORIDE CRYS ER 20 MEQ PO TBCR
20.0000 meq | EXTENDED_RELEASE_TABLET | Freq: Every day | ORAL | Status: DC
  Administered 2017-11-21 – 2017-12-04 (×14): 20 meq via ORAL
  Filled 2017-11-20 (×14): qty 1

## 2017-11-20 MED ORDER — DIGOXIN 125 MCG PO TABS
0.1250 mg | ORAL_TABLET | Freq: Every day | ORAL | Status: DC
Start: 2017-11-20 — End: 2017-11-20
  Administered 2017-11-20: 0.125 mg via ORAL
  Filled 2017-11-20: qty 1

## 2017-11-20 MED ORDER — INSULIN ASPART 100 UNIT/ML ~~LOC~~ SOLN: 0.0000 [IU] | Freq: Every day | SUBCUTANEOUS | Status: DC

## 2017-11-20 MED ORDER — ALUM & MAG HYDROXIDE-SIMETH 200-200-20 MG/5ML PO SUSP: 30.0000 mL | ORAL | Status: DC | PRN

## 2017-11-20 MED ORDER — POLYETHYLENE GLYCOL 3350 17 G PO PACK: 17.0000 g | PACK | Freq: Every day | ORAL | Status: DC | PRN

## 2017-11-20 MED ORDER — LEVALBUTEROL HCL 1.25 MG/0.5ML IN NEBU
1.2500 mg | INHALATION_SOLUTION | Freq: Four times a day (QID) | RESPIRATORY_TRACT | Status: DC | PRN
  Filled 2017-11-20: qty 0.5

## 2017-11-20 MED ORDER — DIGOXIN 125 MCG PO TABS: 0.1250 mg | ORAL_TABLET | Freq: Every day | ORAL | Status: DC

## 2017-11-20 MED ORDER — DIPHENHYDRAMINE HCL 12.5 MG/5ML PO ELIX: 12.5000 mg | ORAL_SOLUTION | Freq: Four times a day (QID) | ORAL | Status: DC | PRN

## 2017-11-20 MED ORDER — IPRATROPIUM BROMIDE 0.02 % IN SOLN: 0.5000 mg | Freq: Four times a day (QID) | RESPIRATORY_TRACT | Status: DC | PRN

## 2017-11-20 MED ORDER — ACETAMINOPHEN 650 MG RE SUPP: 650.0000 mg | RECTAL | 0 refills | Status: DC | PRN

## 2017-11-20 MED ORDER — ACETAMINOPHEN 160 MG/5ML PO SOLN: 650.0000 mg | ORAL | 0 refills | Status: DC | PRN

## 2017-11-20 MED ORDER — GUAIFENESIN-DM 100-10 MG/5ML PO SYRP
5.0000 mL | ORAL_SOLUTION | Freq: Four times a day (QID) | ORAL | Status: DC | PRN
  Administered 2017-11-21: 10 mL via ORAL
  Filled 2017-11-20: qty 10

## 2017-11-20 MED ORDER — LOSARTAN POTASSIUM 25 MG PO TABS
25.0000 mg | ORAL_TABLET | Freq: Every day | ORAL | Status: DC
  Administered 2017-11-21 – 2017-12-04 (×14): 25 mg via ORAL
  Filled 2017-11-20 (×14): qty 1

## 2017-11-20 MED ORDER — POTASSIUM CHLORIDE CRYS ER 20 MEQ PO TBCR: 20.0000 meq | EXTENDED_RELEASE_TABLET | Freq: Every day | ORAL | Status: DC

## 2017-11-20 MED ORDER — PAROXETINE HCL 20 MG PO TABS
20.0000 mg | ORAL_TABLET | Freq: Every day | ORAL | Status: DC
  Administered 2017-11-21 – 2017-12-04 (×14): 20 mg via ORAL
  Filled 2017-11-20 (×14): qty 1

## 2017-11-20 MED ORDER — ADULT MULTIVITAMIN W/MINERALS CH
1.0000 | ORAL_TABLET | Freq: Every day | ORAL | Status: DC
  Administered 2017-11-22 – 2017-12-04 (×12): 1 via ORAL
  Filled 2017-11-20 (×14): qty 1

## 2017-11-20 MED ORDER — ADULT MULTIVITAMIN W/MINERALS CH
1.0000 | ORAL_TABLET | Freq: Every day | ORAL | Status: DC
  Administered 2017-11-21 – 2017-11-28 (×7): 1 via ORAL
  Filled 2017-11-20 (×8): qty 1

## 2017-11-20 MED ORDER — LOSARTAN POTASSIUM 25 MG PO TABS: 25.0000 mg | ORAL_TABLET | Freq: Every day | ORAL | Status: DC

## 2017-11-20 NOTE — Progress Notes (Signed)
STROKE TEAM PROGRESS NOTE   SUBJECTIVE (INTERVAL HISTORY) His wife at the bedside.Neurologically  stable. No changes .OBJECTIVE Vitals:   11/20/17 0319 11/20/17 0732 11/20/17 0844 11/20/17 1251  BP: (!) 124/91 124/79  (P) 92/60  Pulse: 94 (!) 104  (P) 95  Resp: 18 18  (P) 18  Temp: 98.2 F (36.8 C) 98.4 F (36.9 C)  (P) 97.9 F (36.6 C)  TempSrc: Oral Oral  (P) Oral  SpO2: 95% 93% 94% (P) 91%  Weight:        CBC:  Recent Labs  Lab 11/14/17 1812  11/17/17 0315 11/18/17 0250  WBC 7.9   < > 9.0 8.4  NEUTROABS 5.1  --   --   --   HGB 14.3   < > 12.6* 12.0*  HCT 46.5   < > 41.7 38.9*  MCV 94.7   < > 95.6 95.1  PLT 222   < > 154 144*   < > = values in this interval not displayed.    Basic Metabolic Panel:  Recent Labs  Lab 11/19/17 0452 11/20/17 0508  NA 139 139  K 3.6 3.7  CL 101 102  CO2 30 27  GLUCOSE 120* 124*  BUN 23 19  CREATININE 0.91 0.83  CALCIUM 8.3* 8.3*    Lipid Panel:     Component Value Date/Time   CHOL 116 11/15/2017 0342   TRIG 54 11/15/2017 0342   HDL 39 (L) 11/15/2017 0342   CHOLHDL 3.0 11/15/2017 0342   VLDL 11 11/15/2017 0342   LDLCALC 66 11/15/2017 0342   HgbA1c:  Lab Results  Component Value Date   HGBA1C 5.8 (H) 11/15/2017   Urine Drug Screen: No results found for: LABOPIA, COCAINSCRNUR, LABBENZ, AMPHETMU, THCU, LABBARB  Alcohol Level No results found for: ETH  IMAGING  Ct Angio Head W Or Wo Contrast Ct Angio Neck W Or Wo Contrast 11/14/2017   ADDENDUM:  1 cm sclerotic lesion T1 most compatible with bone island though, if there is a history of cancer, consider bone scan.  11/14/2017  IMPRESSION:   CTA NECK:  1. No hemodynamically significant stenosis ICA's.  2. Patent vertebral arteries without dissection.  3. Severe LEFT C3-4 neural foraminal narrowing.   CTA HEAD:  1. No emergent large vessel occlusion or flow-limiting stenosis.  2. Mild intracranial atherosclerosis. Aortic Atherosclerosis (ICD10-I70.0). Emphysema  (ICD10-J43.9).   Ct Head Code Stroke Wo Contrast 11/14/2017  IMPRESSION:  1. Age indeterminate basal ganglia lacunar infarcts superimposed on old lacunar infarcts.  2. Old small LEFT parietoccipital lobe infarct.  3. Moderate chronic small vessel ischemic changes.  4. ASPECTS is 9.   MRI Small acute cortical and subcortical infarct in the posterior right frontal lobe. Transthoracic Echocardiogram  11/15/2017 Left ventricle: The cavity size was mildly dilated. There was   mild focal basal hypertrophy of the septum. Systolic function was   moderately reduced. The estimated ejection fraction was in the   range of 35% to 40%. - Regional wall motion abnormality: Akinesis of the mid anterior   and basal-mid anteroseptal myocardium; hypokinesis of the apical   anterior, mid inferoseptal, apical inferior, apical septal,   apical lateral, and apical myocardium   Myoview Stress Test 11/17/2017 IMPRESSION: 1. No reversible ischemia. Fixed defect in the inferolateral wall may represent infarction. Decrease counts in the inferior wall are favored diaphragmatic attenuation.  2. Global hypokinesia. LEFT ventricular dilatation at rest and stress.  3. Left ventricular ejection fraction 28%  4. Non invasive risk stratification*:  High (based on low ejection fraction).    PHYSICAL EXAM  Temp:  [97.5 F (36.4 C)-98.5 F (36.9 C)] (P) 97.9 F (36.6 C) (09/04 1251) Pulse Rate:  [64-107] (P) 95 (09/04 1251) Resp:  [15-18] (P) 18 (09/04 1251) BP: (117-124)/(79-106) (P) 92/60 (09/04 1251) SpO2:  [91 %-98 %] (P) 91 % (09/04 1251)  General - Well nourished, well developed, in no apparent distress.  Ophthalmologic - fundi not visualized due to noncooperation.  Cardiovascular - irregularly irregular heart rate and rhythm.  Mental Status -  Level of arousal and orientation to place, and person were intact, however not oriented to time. Language including expression, naming, repetition,  comprehension was assessed and found intact.  Cranial Nerves II - XII - II - Visual field intact OU. III, IV, VI - Extraocular movements intact. V - Facial sensation intact bilaterally. VII - subtle left nasolabial fold flattening. VIII - Hearing & vestibular intact bilaterally. X - Palate elevates symmetrically. XI - Chin turning & shoulder shrug intact bilaterally. XII - Tongue protrusion intact.  Motor Strength - The patient's strength was normal in RUE and RLE, however, LUE 3-/5 proximal and distal, LLE 5-/5 proximal and distal and pronator drift was absent.  Bulk was normal and fasciculations were absent.   Motor Tone - Muscle tone was assessed at the neck and appendages and was normal.  Reflexes - The patient's reflexes were symmetrical in all extremities and he had no pathological reflexes.  Sensory - Light touch, temperature/pinprick were assessed and were symmetrical.    Coordination - The patient had normal movements in the right hand with no ataxia or dysmetria.  Tremor was absent.  Gait and Station - deferred.   ASSESSMENT/PLAN Chad Franklin is a 83 y.o. male with history of diabetes mellitus, COPD, afib on coumadin and previous stroke in 2012/2013 presenting with left-sided weakness.  He received IV TPA Thursday 11/14/2017 at 1900 hrs.  Stroke: likely right MCA infarct due to afib with subtherapeutic INR  Resultant  Left hemi-paresis  CT head - Multiple old infarcts - nothing acute.  MRI head - pending - Pt unable to lie flat early this AM  Myoview Stress Test - EF 28%. Non invasive risk stratification*: High (based on low EF).  CTA H&N - no significant stenosis  2D Echo - EF 35-40% akinesis apical and anteroseptal myocardium  LDL - 66  HgbA1c - 5.8  INR 1.1  VTE prophylaxis - SCDs  Diet - Heart healthy / carb modified with thin liquids.  warfarin daily prior to admission, now on No antithrombotic with 24h of tPA.  Patient counseled to be  compliant with his antithrombotic medications  Ongoing aggressive stroke risk factor management  Therapy recommendations: CLR   Disposition:  Pending  Afib with RVR  On coumadin at home, stated compliance  However, INR 1.1 on admission  currently s/p tPA  Warfarin changed to eliquis  RVR - metoprolol 5mg  iv stat  Add metoprolol 25mg  bid for rate control  Hypertension  Stable . Permissive hypertension (OK if < 180/105) but gradually normalize in 5-7 days . Long-term BP goal normotensive  Hyperlipidemia  Lipid lowering medication PTA:  Pravachol 10 mg daily  LDL 66, goal < 70  Current lipid lowering medication: Pravachol 10 mg daily  Continue statin at discharge  Diabetes  HgbA1c 5.8, goal < 7.0  Controlled  SSI  CBG monitoring  Other Stroke Risk Factors  Advanced age  Former cigarette smoker - quit  Hx stroke/TIA in 02/2011 - 03/2011 - CT showed left CMA and right BG old infarcts  Other Active Problems  1 cm sclerotic lesion T1 most compatible with bone island  CHF - improved  Afib with RVR - improved      Hospital day # 6 On discussion with patient and wife at the bedside and answered questions.Roselle Locus therapy. Await rehabilitation decision. Medically stable for transfer to rehabilitation when bed available      Delia Heady, MD Medical Director [facility] Pager: [phone] 11/20/2017 2:54 PM      To contact Stroke Continuity provider, please refer to [email or web address]. After hours, contact General Neurology

## 2017-11-20 NOTE — Progress Notes (Signed)
Pt arrived to unit via w/c, pt transferred min/mod assist with staff, bed alarm, call bell in reach. Will continue to monitor.

## 2017-11-20 NOTE — Discharge Summary (Signed)
Physician Discharge Summary  Patient ID: Chad Franklin MRN: [id number] DOB/AGE: 04-25-1934 83 y.o.  Admit date: 11/14/2017 Discharge date: 11/20/2017  Admission Diagnoses: left arm weakness  Discharge Diagnoses: Small acute cortical and subcortical infarct in the posterior all of cardioembolic etiology with atrial fibrillation treated with IV TPA. Patient was on anticoagulation with warfarin but INR was suboptimal right frontal lobe Active Problems:   Acute ischemic stroke (HCC)   Chronic atrial fibrillation (HCC)   Acute combined systolic and diastolic congestive heart failure (HCC)   Diabetes mellitus type 2 in nonobese [facility])   Atrial fibrillation with rapid ventricular response (HCC)   History of CVA (cerebrovascular accident)   Hypokalemia   Acute blood loss anemia   Discharged Condition: fair  Hospital Course: Chad Franklin is an 83 y.o. male past medical history of diabetes mellitus, COPD, stroke (patient and wife denies this) on Coumadin  For unclear reeason presents  the emergency room with sudden onset left-sided weakness that began around 3 PM witnessed by his wife.  They did not call EMS and was stroke alerted by ED triage. CT head showed old infarcts, however no acute abnormality.  NIH stroke scale was 5. TPA was administered after waiting for INR which returned as 1.1.  Patient states he is compliant with Coumadin and took a dose this morning.  Last time INR was checked was a month ago.  He had a neck was performed which showed no large vessel occlusion Date last known well: 8.29.19 Time last known well: 3pm tPA Given: Yes NIHSS on arrival : 5 Baseline MRS 0  after discussion of risk-benefit of IV TPA patient and family agreed and IV tPA was administered uneventfully. He was admitted to the intensive care unit where blood pressure was tightly controlled. He may neurologically stable. MRI scan of the brain which confirmed a small right frontal MCA branch infarct. Patient  was seen by physical occupational therapy and felt to be candidate for inpatient rehabilitation. He was transferred to the floor bed. He was started on eliquis for anticoagulation. Cardiology were consulted and helped with his rate control and management of CHF.cardio recommend a Myoview stress test which was obtained but did not show any reversible defect. Transversely echo showed diminished ejection fraction of 35-40% with regional wall motion abnormalities. CT angiogram showed no emergent large vessel occlusion in the neck of the brain.LDL cholesterol was 66 kg percent hemoglobin A1c was 5.8. Patient was started on heart healthy/carb modified diet with thin liquids. He was seen by the rehabilitation team and felt to be a good candidate for inpatient rehabilitation. Patient showed some neurological improvement in left hand weakness improved.on the day of discharge he still had mild left facial droop and had 3/5 left upper extremity strength with weakness of left grip. He had very minimum left lower extremity weakness and left hip flexors and ankle dorsiflexors only. He was transferred to inpatient rehabilitation in a stable condition and advised to follow-up in the stroke clinic with Dr. Pearlean Brownie in 6 weeks and with his cardiologist as scheduled Consults: cardiology and rehabilitation medicine  Significant Diagnostic Studies: as sated in HPI  Treatments: iv TPA  Discharge Exam: Blood pressure (P) 128/88, pulse (P) 95, temperature (P) 98 F (36.7 C), temperature source (P) Oral, resp. rate (P) 18, weight 104.7 kg, SpO2 (P) 96 %.  pleasant obese elderly Caucasian male not in distress. . Afebrile. Head is nontraumatic. Neck is supple without bruit.    Cardiac exam no murmur or  gallop. Lungs are clear to auscultation. Distal pulses are well felt. Neurological exam :  Mental Status -  Level of arousal and orientation to place, and person were intact, however not oriented to time. Language including  expression, naming, repetition, comprehension was assessed and found intact.  Cranial Nerves II - XII - II - Visual field intact OU. III, IV, VI - Extraocular movements intact. V - Facial sensation intact bilaterally. VII - subtle left nasolabial fold flattening. VIII - Hearing & vestibular intact bilaterally. X - Palate elevates symmetrically. XI - Chin turning & shoulder shrug intact bilaterally. XII - Tongue protrusion intact.  Motor Strength - The patient's strength was normal in RUE and RLE, however, LUE 3-/5 proximal and distal, LLE 5-/5 proximal and distal and pronator drift was absent.  Bulk was normal and fasciculations were absent.   Motor Tone - Muscle tone was assessed at the neck and appendages and was normal.  Reflexes - The patient's reflexes were symmetrical in all extremities and he had no pathological reflexes.  Sensory - Light touch, temperature/pinprick were assessed and were symmetrical.    Coordination - The patient had normal movements in the right hand with no ataxia or dysmetria.  Tremor was absent.  Gait and Station - deferred. Disposition: Discharge disposition: [facility] Not Defined       Discharge Instructions    Diet - low sodium heart healthy   Complete by:  As directed    Diet - low sodium heart healthy   Complete by:  As directed    Increase activity slowly   Complete by:  As directed    Increase activity slowly   Complete by:  As directed      Allergies as of 11/20/2017      Reactions   Naproxen Sodium Anaphylaxis, Hives, Shortness Of Breath, Swelling      Medication List    STOP taking these medications   warfarin 5 MG tablet Commonly known as:  COUMADIN     TAKE these medications   acetaminophen 500 MG tablet Commonly known as:  TYLENOL Take 500 mg by mouth 2 (two) times daily. What changed:  Another medication with the same name was added. Make sure you understand how and when to take each.    acetaminophen 650 MG suppository Commonly known as:  TYLENOL Place 1 suppository (650 mg total) rectally every 4 (four) hours as needed for mild pain (or temp > 37.5 C (99.5 F)). What changed:  You were already taking a medication with the same name, and this prescription was added. Make sure you understand how and when to take each.   acetaminophen 160 MG/5ML solution Commonly known as:  TYLENOL Place 20.3 mLs (650 mg total) into feeding tube every 4 (four) hours as needed for mild pain (or temp > 37.5 C (99.5 F)). What changed:  You were already taking a medication with the same name, and this prescription was added. Make sure you understand how and when to take each.   apixaban 5 MG Tabs tablet Commonly known as:  ELIQUIS Take 1 tablet (5 mg total) by mouth 2 (two) times daily.   bisacodyl 10 MG suppository Commonly known as:  DULCOLAX Place 1 suppository (10 mg total) rectally once for 1 dose.   carvedilol 12.5 MG tablet Commonly known as:  COREG Take 1 tablet (12.5 mg total) by mouth 2 (two) times daily with a meal.   digoxin 0.125 MG tablet Commonly known as:  LANOXIN  Take 1 tablet (0.125 mg total) by mouth daily. Start taking on:  11/21/2017   furosemide 20 MG tablet Commonly known as:  LASIX Take 40 mg by mouth daily.   ipratropium 0.02 % nebulizer solution Commonly known as:  ATROVENT Take 2.5 mLs (0.5 mg total) by nebulization every 6 (six) hours as needed for wheezing or shortness of breath.   levalbuterol 1.25 MG/0.5ML nebulizer solution Commonly known as:  XOPENEX Take 1.25 mg by nebulization every 6 (six) hours as needed for wheezing or shortness of breath.   losartan 25 MG tablet Commonly known as:  COZAAR Take 1 tablet (25 mg total) by mouth daily. Start taking on:  11/21/2017   losartan-hydrochlorothiazide 100-12.5 MG tablet Commonly known as:  HYZAAR Take 1 tablet by mouth daily.   multivitamin with minerals Tabs tablet Take 1 tablet by mouth  daily. What changed:  Another medication with the same name was added. Make sure you understand how and when to take each.   multivitamin with minerals Tabs tablet Take 1 tablet by mouth daily. Start taking on:  11/21/2017 What changed:  You were already taking a medication with the same name, and this prescription was added. Make sure you understand how and when to take each.   ondansetron 4 MG/2ML Soln injection Commonly known as:  ZOFRAN Inject 2 mLs (4 mg total) into the vein every 4 (four) hours as needed for nausea or vomiting.   PARoxetine 20 MG tablet Commonly known as:  PAXIL Take 20 mg by mouth daily.   potassium chloride SA 20 MEQ tablet Commonly known as:  K-DUR,KLOR-CON Take 1 tablet (20 mEq total) by mouth daily. Start taking on:  11/21/2017   pravastatin 10 MG tablet Commonly known as:  PRAVACHOL Take 10 mg by mouth daily.   senna-docusate 8.6-50 MG tablet Commonly known as:  Senokot-S Take 1 tablet by mouth at bedtime as needed for mild constipation.   verapamil 240 MG CR tablet Commonly known as:  CALAN-SR Take 240 mg by mouth daily.      time spent in doing discharge summary 35 minutes  Signed: Delia Heady 11/20/2017, 5:10 PM

## 2017-11-20 NOTE — Progress Notes (Signed)
Inpatient Rehabilitation Admissions Coordinator  I have insurance approval to admit pt to inpt rehab today. Wife and pt in agreement. I have contacted Dr. Pearlean Brownie and he is aware of admit and in agreement. RN CM, Harvin Hazel made aware. I will make the arrangements to admit today.  Ottie Glazier, RN, MSN Rehab Admissions Coordinator [phone] 11/20/2017 4:06 PM

## 2017-11-20 NOTE — Progress Notes (Signed)
Occupational Therapy Treatment Patient Details Name: Chad Franklin MRN: [id number] DOB: April 03, 1934 Today's Date: 11/20/2017    History of present illness 83 yo admitted with left weakness s/p tPA with CT demonstrating Old small LEFT parietoccipital lobe infarct and MRI pending. PMhx: Afib, DM, COPD, stroke (patient and wife denies this) on Coumadin    OT comments  Pt progressing towards OT goals this session. Pt able to complete sink level grooming with cues to incorporate LUE into BUE tasks (wringing out washcloth) as well as engaged in "tug of war" using isometric exercises of BUE (RUE over LUE) and trunk. Pt continues to require 2 person assist for mobility - but has also improved in transfers to min A +2 for safety. Pt is eager to enter the next phase of therapy on CIR and will benefit and enjoy the intense rehab to return to PLOF. It has been a true joy and honor to work with this patient.   Follow Up Recommendations  CIR;Supervision/Assistance - 24 hour    Equipment Recommendations  Other (comment)(defer to next venue)    Recommendations for Other Services      Precautions / Restrictions Precautions Precautions: Fall Precaution Comments: fall immediately prior to admission Restrictions Weight Bearing Restrictions: No       Mobility Bed Mobility Overal bed mobility: Needs Assistance Bed Mobility: Supine to Sit     Supine to sit: Min assist     General bed mobility comments: assist to elevate trunk - good use of BUE to bring hips to EOB  Transfers Overall transfer level: Needs assistance Equipment used: 2 person hand held assist Transfers: Sit to/from Stand Sit to Stand: Min assist         General transfer comment: Min assist for sit to stand requiring verbal and tactile cues for left hand placement to push from sit to stand.    Balance Overall balance assessment: Needs assistance Sitting-balance support: Feet supported Sitting balance-Leahy Scale:  Fair Sitting balance - Comments: patient able to perform static sitting without difficulty, some modest challenge tolerated Postural control: Right lateral lean Standing balance support: During functional activity Standing balance-Leahy Scale: Poor Standing balance comment: required UE support in static standing                           ADL either performed or assessed with clinical judgement   ADL Overall ADL's : Needs assistance/impaired     Grooming: Wash/dry face;Minimal assistance;Standing Grooming Details (indicate cue type and reason): cues for standing balance for postural corrections, able to use LUE and squeeze wash cloth weakly.          Upper Body Dressing : Maximal assistance;Sitting Upper Body Dressing Details (indicate cue type and reason): EOB to don hospital gown like robe     Toilet Transfer: Min guard;+2 for safety/equipment;Ambulation Toilet Transfer Details (indicate cue type and reason): no assist to rise, assist for balance and steady once standing         Functional mobility during ADLs: Moderate assistance;+2 for physical assistance(2 person HHA)       Vision       Perception     Praxis      Cognition Arousal/Alertness: Awake/alert Behavior During Therapy: WFL for tasks assessed/performed Overall Cognitive Status: Within Functional Limits for tasks assessed  Exercises Exercises: Other exercises Other Exercises Other Exercises: "tug of war" x3 engaging LUE (Hand over hand with RUE) and trunk in isometric exercise   Shoulder Instructions       General Comments wife present throughout session    Pertinent Vitals/ Pain       Pain Assessment: No/denies pain Pain Intervention(s): Monitored during session  Home Living                                          Prior Functioning/Environment              Frequency  Min 3X/week         Progress Toward Goals  OT Goals(current goals can now be found in the care plan section)  Progress towards OT goals: Progressing toward goals  Acute Rehab OT Goals Patient Stated Goal: return to home OT Goal Formulation: With patient/family Time For Goal Achievement: 12/02/17 Potential to Achieve Goals: Good  Plan Discharge plan remains appropriate;Frequency remains appropriate    Co-evaluation    PT/OT/SLP Co-Evaluation/Treatment: Yes Reason for Co-Treatment: Complexity of the patient's impairments (multi-system involvement);For patient/therapist safety;To address functional/ADL transfers PT goals addressed during session: Mobility/safety with mobility;Balance;Strengthening/ROM OT goals addressed during session: ADL's and self-care;Strengthening/ROM      AM-PAC PT "6 Clicks" Daily Activity     Outcome Measure   Help from another person eating meals?: A Little Help from another person taking care of personal grooming?: A Lot Help from another person toileting, which includes using toliet, bedpan, or urinal?: A Lot Help from another person bathing (including washing, rinsing, drying)?: A Lot Help from another person to put on and taking off regular upper body clothing?: A Lot Help from another person to put on and taking off regular lower body clothing?: A Lot 6 Click Score: 13    End of Session Equipment Utilized During Treatment: Gait belt  OT Visit Diagnosis: Unsteadiness on feet (R26.81);Other abnormalities of gait and mobility (R26.89);Hemiplegia and hemiparesis Hemiplegia - Right/Left: Left Hemiplegia - dominant/non-dominant: Non-Dominant Hemiplegia - caused by: Cerebral infarction   Activity Tolerance Patient tolerated treatment well   Patient Left in chair;with call bell/phone within reach;with family/visitor present   Nurse Communication Mobility status        Time: 1610-9604 OT Time Calculation (min): 29 min  Charges: OT General Charges $OT Visit: 1  Visit OT Treatments $Self Care/Home Management : 8-22 mins  Sherryl Manges OTR/L Acute Rehabilitation Services Pager: [phone] Office: [phone]   Chad Franklin 11/20/2017, 4:28 PM

## 2017-11-20 NOTE — Progress Notes (Signed)
Progress Note  Patient Name: Chad Franklin Date of Encounter: 11/20/2017  Primary Cardiologist:   No primary care provider on file.   Subjective   Denies chest pain or SOB.  Hoping to move to rehab.   Inpatient Medications    Scheduled Meds: .  stroke: mapping our early stages of recovery book   Does not apply Once  . apixaban  5 mg Oral BID  . bisacodyl  10 mg Rectal Once  . carvedilol  12.5 mg Oral BID WC  . furosemide  40 mg Oral Daily  . insulin aspart  0-15 Units Subcutaneous TID WC  . insulin aspart  0-5 Units Subcutaneous QHS  . ipratropium  0.5 mg Nebulization TID  . levalbuterol  1.25 mg Nebulization TID  . losartan  25 mg Oral Daily  . multivitamin with minerals  1 tablet Oral Daily  . PARoxetine  20 mg Oral Daily  . potassium chloride  20 mEq Oral Daily  . pravastatin  10 mg Oral Daily   Continuous Infusions:  PRN Meds: acetaminophen **OR** acetaminophen (TYLENOL) oral liquid 160 mg/5 mL **OR** acetaminophen, ondansetron (ZOFRAN) IV, senna-docusate   Vital Signs    Vitals:   11/19/17 2340 11/20/17 0319 11/20/17 0732 11/20/17 0844  BP: (!) 117/106 (!) 124/91 124/79   Pulse: (!) 107 94 (!) 104   Resp: 18 18 18    Temp: 98.5 F (36.9 C) 98.2 F (36.8 C) 98.4 F (36.9 C)   TempSrc: Oral Oral Oral   SpO2: 98% 95% 93% 94%  Weight:        Intake/Output Summary (Last 24 hours) at 11/20/2017 1103 Last data filed at 11/20/2017 0700 Gross per 24 hour  Intake 720 ml  Output 500 ml  Net 220 ml   Filed Weights   11/14/17 1800  Weight: 104.7 kg    Telemetry    Atrial fib with rapid rate.   - Personally Reviewed  ECG    NA - Personally Reviewed  Physical Exam   GEN: No acute distress.   Neck: No  JVD Cardiac: Irregular RR, no murmurs, rubs, or gallops.  Respiratory: Clear  to auscultation bilaterally. GI: Soft, nontender, non-distended  MS: No  edema; No deformity. Neuro:  Nonfocal  Psych: Normal affect   Labs    Chemistry Recent Labs    Lab 11/14/17 1812  11/18/17 0250 11/19/17 0452 11/20/17 0508  NA 142   < > 138 139 139  K 3.9   < > 3.4* 3.6 3.7  CL 104   < > 101 101 102  CO2 30   < > 31 30 27   GLUCOSE 126*   < > 115* 120* 124*  BUN 14   < > 28* 23 19  CREATININE 1.16   < > 0.95 0.91 0.83  CALCIUM 8.8*   < > 8.3* 8.3* 8.3*  PROT 7.4  --   --   --   --   ALBUMIN 3.7  --   --   --   --   AST 21  --   --   --   --   ALT 15  --   --   --   --   ALKPHOS 74  --   --   --   --   BILITOT 1.0  --   --   --   --   GFRNONAA 57*   < > >60 >60 >60  GFRAA >60   < > >60 >  60 >60  ANIONGAP 8   < > 6 8 10    < > = values in this interval not displayed.     Hematology Recent Labs  Lab 11/16/17 0052 11/17/17 0315 11/18/17 0250  WBC 8.6 9.0 8.4  RBC 4.61 4.36 4.09*  HGB 13.5 12.6* 12.0*  HCT 43.9 41.7 38.9*  MCV 95.2 95.6 95.1  MCH 29.3 28.9 29.3  MCHC 30.8 30.2 30.8  RDW 14.2 14.1 13.9  PLT 178 154 144*    Cardiac Enzymes Recent Labs  Lab 11/15/17 1329 11/15/17 1854 11/16/17 0052  TROPONINI <0.03 <0.03 <0.03    Recent Labs  Lab 11/14/17 1814  TROPIPOC 0.01     BNPNo results for input(s): BNP, PROBNP in the last 168 hours.   DDimer No results for input(s): DDIMER in the last 168 hours.   Radiology    Dg Chest 2 View  Result Date: 11/19/2017 CLINICAL DATA:  Recent stroke, RIGHT lower chest and abdominal soreness, ongoing shortness of breath, and dry cough EXAM: CHEST - 2 VIEW COMPARISON:  11/15/2017 FINDINGS: Enlargement of cardiac silhouette. Mediastinal contours and pulmonary vascularity normal. Bronchitic changes without infiltrate, pleural effusion or pneumothorax. Bones demineralized. IMPRESSION: Enlargement of cardiac silhouette. Bronchitic changes without infiltrate. Electronically Signed   By: Ulyses Southward M.D.   On: 11/19/2017 16:32    Cardiac Studies   Echo 11/15/17 Study Conclusions  - Left ventricle: The cavity size was mildly dilated. There was mild focal basal hypertrophy of the  septum. Systolic function was moderately reduced. The estimated ejection fraction was in the range of 35% to 40%. - Regional wall motion abnormality: Akinesis of the mid anterior and basal-mid anteroseptal myocardium; hypokinesis of the apical anterior, mid inferoseptal, apical inferior, apical septal, apical lateral, and apical myocardium. - Aortic valve: There was trivial regurgitation. - Mitral valve: Calcified annulus. Mildly thickened leaflets . There was moderate regurgitation. - Left atrium: The atrium was massively dilated. - Right ventricle: The cavity size was moderately dilated. Wall thickness was normal. Systolic function was mildly reduced. - Right atrium: The atrium was moderately dilated. - Atrial septum: No defect or patent foramen ovale was identified. - Tricuspid valve: There was mild regurgitation. - Pulmonic valve: There was mild regurgitation. - Pulmonary arteries: Systolic pressure was moderately increased. PA peak pressure: 44 mm Hg (S).  Impressions:  - Reduced LVEF with wall motion abnormalities across the anteroseptal, anterior, inferoseptal, and apical myocardium. Consider multivessel CAD vs. takotsubo. Massively dilated LA with moderate MR. Elevated PASP of 44 mmHg.   Patient Profile     83 y.o. male with PMH of DM2, COPD, permanent atrial fibrillation on chronic Coumadin and prior CVA 2012/2013 who is being followed by cardiology for atrial fibrillation with newly decreased LV function and the need for DOAC therapy  Assessment & Plan    ATRIAL FIB:  Beta blocker increased yesterday.  On Eliquis.  Rate still increased at times.  I will add digoxin.   ACUTE SYSTOLIC HF:  CXR yesterday as above. No evidence of edema.  No change in therapy.     HTN:  Permissive HTN per neurology.  However, BP is actually normal as we try to improve rate control.  I will not push the beta blocker at this point.   RIGHT MCA STROKE:  Per primary  team.     For questions or updates, please contact [facility] Please consult [email or web address] for contact info under Cardiology/STEMI.   Signed, Rollene Rotunda, MD  11/20/2017, 11:03 AM

## 2017-11-20 NOTE — Progress Notes (Signed)
Physical Therapy Treatment Patient Details Name: Chad Franklin MRN: [id number] DOB: 20-Jul-1934 Today's Date: 11/20/2017    History of Present Illness 83 yo admitted with left weakness s/p tPA with CT demonstrating Old small LEFT parietoccipital lobe infarct and MRI pending. PMhx: Afib, DM, COPD, stroke (patient and wife denies this) on Coumadin     PT Comments    Patient remains highly motivated to participate in therapies. Tolerated joint session with PT/OT today with functional task performance and gait retraining. Noted improvements compared to previous session. Current POC remains appropriate. Plan for CIR.    Follow Up Recommendations  CIR;Supervision/Assistance - 24 hour     Equipment Recommendations  3in1 (PT);Other (comment)(walking AD TBD)    Recommendations for Other Services OT consult     Precautions / Restrictions Precautions Precautions: Fall Precaution Comments: fall immediately prior to admission Restrictions Weight Bearing Restrictions: No    Mobility  Bed Mobility Overal bed mobility: Needs Assistance Bed Mobility: Supine to Sit     Supine to sit: Min assist     General bed mobility comments: assist to elevate trunk - good use of BUE to bring hips to EOB  Transfers Overall transfer level: Needs assistance Equipment used: 2 person hand held assist Transfers: Sit to/from Stand Sit to Stand: Min assist         General transfer comment: Min assist for sit to stand requiring verbal and tactile cues for left hand placement to push from sit to stand.  Ambulation/Gait Ambulation/Gait assistance: Mod assist Gait Distance (Feet): 16 Feet(x3) Assistive device: 2 person hand held assist Gait Pattern/deviations: Step-through pattern;Decreased stride length;Decreased weight shift to right Gait velocity: decreased Gait velocity interpretation: <1.8 ft/sec, indicate of risk for recurrent falls General Gait Details: Multimodal cues for weight shift, posture  and stride control. More deviations apparent with fatigue, tolerated x3 during session   Stairs             Wheelchair Mobility    Modified Rankin (Stroke Patients Only)       Balance Overall balance assessment: Needs assistance Sitting-balance support: Feet supported Sitting balance-Leahy Scale: Fair Sitting balance - Comments: patient able to perform static sitting without difficulty, some modest challenge tolerated Postural control: Right lateral lean Standing balance support: During functional activity Standing balance-Leahy Scale: Poor Standing balance comment: required UE support in static standing                            Cognition Arousal/Alertness: Awake/alert Behavior During Therapy: WFL for tasks assessed/performed Overall Cognitive Status: Within Functional Limits for tasks assessed                                        Exercises Other Exercises Other Exercises: "tug of war" x3 engaging LUE (Hand over hand with RUE) and trunk in isometric exercise    General Comments General comments (skin integrity, edema, etc.): wife present throughout session      Pertinent Vitals/Pain Pain Assessment: Faces Faces Pain Scale: Hurts little more Pain Location: back pain Pain Descriptors / Indicators: Aching Pain Intervention(s): Monitored during session    Home Living                      Prior Function            PT Goals (current goals can now  be found in the care plan section) Acute Rehab PT Goals Patient Stated Goal: return to home PT Goal Formulation: With patient/family Time For Goal Achievement: 11/30/17 Potential to Achieve Goals: Good Progress towards PT goals: Progressing toward goals    Frequency    Min 4X/week      PT Plan Current plan remains appropriate    Co-evaluation PT/OT/SLP Co-Evaluation/Treatment: Yes Reason for Co-Treatment: Complexity of the patient's impairments (multi-system  involvement);For patient/therapist safety;To address functional/ADL transfers PT goals addressed during session: Mobility/safety with mobility;Balance;Strengthening/ROM OT goals addressed during session: ADL's and self-care;Strengthening/ROM      AM-PAC PT "6 Clicks" Daily Activity  Outcome Measure  Difficulty turning over in bed (including adjusting bedclothes, sheets and blankets)?: A Little Difficulty moving from lying on back to sitting on the side of the bed? : A Little Difficulty sitting down on and standing up from a chair with arms (e.g., wheelchair, bedside commode, etc,.)?: A Little Help needed moving to and from a bed to chair (including a wheelchair)?: A Lot Help needed walking in hospital room?: A Lot Help needed climbing 3-5 steps with a railing? : A Lot 6 Click Score: 15    End of Session Equipment Utilized During Treatment: Gait belt Activity Tolerance: Patient tolerated treatment well Patient left: in chair;with call bell/phone within reach;with chair alarm set;with family/visitor present Nurse Communication: Mobility status PT Visit Diagnosis: Unsteadiness on feet (R26.81);Other abnormalities of gait and mobility (R26.89);Muscle weakness (generalized) (M62.81);Hemiplegia and hemiparesis Hemiplegia - Right/Left: Left Hemiplegia - dominant/non-dominant: Non-dominant Hemiplegia - caused by: Cerebral infarction     Time: 3335-4562 PT Time Calculation (min) (ACUTE ONLY): 28 min  Charges:  $Gait Training: 8-22 mins                     Charlotte Crumb, PT DPT  Board Certified Neurologic Specialist [phone]    Fabio Asa 11/20/2017, 4:37 PM

## 2017-11-20 NOTE — Progress Notes (Signed)
Transferred pt to Rehab unit.

## 2017-11-20 NOTE — Care Management Note (Signed)
Case Management Note  Patient Details  Name: Chad Franklin MRN: [id number] Date of Birth: [birth year]  Subjective/Objective:   Pt admitted with CVA. He is from home with spouse.                 Action/Plan: Pt discharging to CIR today. CM signing off.   Expected Discharge Date:  11/20/17               Expected Discharge Plan:  IP Rehab Facility  In-House Referral:     Discharge planning Services  CM Consult  Post Acute Care Choice:    Choice offered to:     DME Arranged:    DME Agency:     HH Arranged:    HH Agency:     Status of Service:  Completed, signed off  If discussed at [employer], dates discussed:    Additional Comments:  Kermit Balo, RN 11/20/2017, 4:02 PM

## 2017-11-20 NOTE — Progress Notes (Signed)
Marcello Fennel, MD  Physician  Physical Medicine and Rehabilitation  Consult Note  Signed  Date of Service:  11/18/2017 8:22 AM       Related encounter: ED to Hosp-Admission (Current) from 11/14/2017 in [facility]      Signed      Expand All Collapse All    Show:Clear all [x] Manual[x] Template[] Copied  Added by: [x] Love, Evlyn Kanner, PA-C[x] Marcello Fennel, MD  [] Hover for details      Physical Medicine and Rehabilitation Consult   Reason for Consult: Functional decline due to stroke Referring Physician: Dr. Pearlean Brownie   HPI: Chad Franklin is a 83 y.o. male with history of T2DM, COPD, A. fib on chronic Coumadin, CVA; who was admitted on 11/14/2017 with sudden onset of left-sided weakness.  History taken from chart review, patient, and wife. CTA head neck negative for large vessel occlusion or flow-limiting stenosis, severe left C3/4 neuroforaminal narrowing.  INR at admission 1.1 and he received IV TPA.  2D echo done showing EF of 35 to 40% apical and anteroseptal myocardial akinesis. He had acute onset of vomiting and was negative for bleed.  MRI of brain reviewed, showing, right frontal CVA.  Per report, done revealing small acute cortical/ subcortical infarct in posterior right frontal lobe and remote left parieto-occipital infarct. Dr. Gala Romney consulted for input on the DOAC and recommended Myoview for work-up due to decline in EF and new acute CHF.  Myoview showed EF of 28% with global hypokinesis, no reversible ischemia and fixed inferolateral lateral wall may represent infarct.  A. fib with RVR treated with metoprolol. Therapy evaluations done revealing left-sided weakness with balance deficits, poor awareness of deficits and significant cognitive deficits.  CIR recommended due to functional deficits  Review of Systems  Constitutional: Negative for chills and fever.  HENT: Positive for hearing loss. Negative for ear pain and tinnitus.   Eyes:  Negative for blurred vision and double vision.  Respiratory: Positive for shortness of breath. Negative for cough.   Cardiovascular: Positive for chest pain.  Gastrointestinal: Positive for constipation. Negative for heartburn and nausea.  Genitourinary: Negative for dysuria and urgency.  Musculoskeletal: Negative for myalgias.  Skin: Negative for rash.  Neurological: Positive for focal weakness. Negative for dizziness and headaches.  Psychiatric/Behavioral: The patient is nervous/anxious.   All other systems reviewed and are negative.        Past Medical History:  Diagnosis Date  . Bilateral swelling of feet   . COPD (chronic obstructive pulmonary disease) (HCC)   . Depression   . Diabetes mellitus   . GERD (gastroesophageal reflux disease)   . Palpitations   . Stroke [facility])    2         Past Surgical History:  Procedure Laterality Date  . GALLBLADDER SURGERY           Family History  Problem Relation Age of Onset  . Hypertension Father     Social History:  Married--wife retired and supportive. 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 Illiterate--reports that he is unable to read or write. Per  reports that he has quit smoking. He does not have any smokeless tobacco history on file. He reports that he does not drink alcohol. His drug history is not on file.        Allergies  Allergen Reactions  . Naproxen Sodium Anaphylaxis, Hives, Shortness Of Breath and Swelling         Medications Prior to Admission  Medication  Sig Dispense Refill  . acetaminophen (TYLENOL) 500 MG tablet Take 500 mg by mouth 2 (two) times daily.    . furosemide (LASIX) 20 MG tablet Take 40 mg by mouth daily.    Marland Kitchen losartan-hydrochlorothiazide (HYZAAR) 100-12.5 MG tablet Take 1 tablet by mouth daily.    . Multiple Vitamin (MULTIVITAMIN WITH MINERALS) TABS tablet Take 1 tablet by mouth daily.    Marland Kitchen PARoxetine (PAXIL) 20 MG tablet Take 20 mg by mouth daily.    .  pravastatin (PRAVACHOL) 10 MG tablet Take 10 mg by mouth daily.    . verapamil (CALAN-SR) 240 MG CR tablet Take 240 mg by mouth daily.    Marland Kitchen warfarin (COUMADIN) 5 MG tablet Take 2.5-5 mg by mouth See admin instructions. Take 1/2 tablet (2.5mg ) Mondays and 1 tablet (5mg ) every other day of the week.      Home: Home Living Family/patient expects to be discharged to:: Private residence Living Arrangements: Spouse/significant other Available Help at Discharge: Family, Available 24 hours/day Type of Home: House Home Access: Stairs to enter Secretary/administrator of Steps: 3 Home Layout: One level Bathroom Shower/Tub: Engineer, manufacturing systems: Standard Home Equipment: None  Functional History: Prior Function Level of Independence: Independent Functional Status:  Mobility: Bed Mobility Overal bed mobility: Needs Assistance Bed Mobility: Supine to Sit Supine to sit: Min guard, HOB elevated General bed mobility comments: pt with increased time and struggle to pivot to left and reaching with RUE for assist Transfers Overall transfer level: Needs assistance Transfers: Sit to/from Stand Sit to Stand: Min assist General transfer comment: min assist to stand from bed with cues for safety and reaching for surface to sit, as well as cues to back fully to surface Ambulation/Gait Ambulation/Gait assistance: Mod assist, +2 physical assistance Gait Distance (Feet): 35 Feet Assistive device: Rolling walker (2 wheeled) Gait Pattern/deviations: Step-to pattern, Narrow base of support, Decreased weight shift to left General Gait Details: pt leaning left with consistent loss of grip on LUE on RW with assist to maintain hand on RW with lack of proprioception. pt with periods of partial left knee buckling with flexed trunk. Mod assist +2 to control, trunk and balance as well as lines. Pt unaware of deficits and need for assist.  Gait velocity interpretation: <1.8 ft/sec, indicate of risk for  recurrent falls  ADL:  Cognition: Cognition Overall Cognitive Status: Impaired/Different from baseline Arousal/Alertness: Awake/alert Orientation Level: Oriented to person, Oriented to place, Oriented to situation Attention: Selective Selective Attention: Impaired Selective Attention Impairment: Verbal basic, Functional basic(selective digit repetition impaired, distracted by sounds) Memory: Impaired Memory Impairment: Storage deficit, Decreased recall of new information, Decreased short term memory Decreased Short Term Memory: Verbal basic Awareness: Impaired Awareness Impairment: Intellectual impairment Problem Solving: Impaired Problem Solving Impairment: Verbal basic Executive Function: Reasoning Reasoning: Impaired Reasoning Impairment: Verbal basic Safety/Judgment: Impaired Comments: decreased awareness of deficits Cognition Arousal/Alertness: Awake/alert Behavior During Therapy: WFL for tasks assessed/performed Overall Cognitive Status: Impaired/Different from baseline Area of Impairment: Safety/judgement Safety/Judgement: Decreased awareness of deficits, Decreased awareness of safety   Blood pressure (!) 130/103, pulse (!) 109, temperature 99 F (37.2 C), temperature source Oral, resp. rate 17, weight 104.7 kg, SpO2 95 %. Physical Exam  Nursing note and vitals reviewed. Constitutional: He is oriented to person, place, and time. He appears well-developed and well-nourished. Nasal cannula in place.  NAD  HENT:  Head: Normocephalic and atraumatic.  Eyes: EOM are normal. Right eye exhibits no discharge. Left eye exhibits no discharge.  Neck: Normal range  of motion. Neck supple.  Cardiovascular:  Irregularly irregular  Respiratory: Effort normal and breath sounds normal.  +Ingalls  GI: Bowel sounds are normal. He exhibits distension. There is no tenderness.  Musculoskeletal:  No edema or tenderness in extremities  Neurological: He is alert and oriented to person,  place, and time.  Soft voice.  Decreased hearing.  Able to state age/DOB but unable to state month--thought that it was spring.  He was able to follow simple motor commands with occasional cues.  Motor: RUE, B/l LE 5/5 proximal to distal LUE: 0/5 proximal to distal No increase in tone Sensation intact to light touch  Skin: Skin is warm and dry.  Psychiatric: He has a normal mood and affect. His behavior is normal.    LabResultsLast24Hours       Results for orders placed or performed during the hospital encounter of 11/14/17 (from the past 24 hour(s))  Glucose, capillary     Status: Abnormal   Collection Time: 11/17/17 12:19 PM  Result Value Ref Range   Glucose-Capillary 157 (H) 70 - 99 mg/dL  Glucose, capillary     Status: Abnormal   Collection Time: 11/17/17  4:54 PM  Result Value Ref Range   Glucose-Capillary 107 (H) 70 - 99 mg/dL  Glucose, capillary     Status: Abnormal   Collection Time: 11/17/17  9:00 PM  Result Value Ref Range   Glucose-Capillary 164 (H) 70 - 99 mg/dL  CBC     Status: Abnormal   Collection Time: 11/18/17  2:50 AM  Result Value Ref Range   WBC 8.4 4.0 - 10.5 K/uL   RBC 4.09 (L) 4.22 - 5.81 MIL/uL   Hemoglobin 12.0 (L) 13.0 - 17.0 g/dL   HCT 80.0 (L) 34.9 - 17.9 %   MCV 95.1 78.0 - 100.0 fL   MCH 29.3 26.0 - 34.0 pg   MCHC 30.8 30.0 - 36.0 g/dL   RDW 15.0 56.9 - 79.4 %   Platelets 144 (L) 150 - 400 K/uL  Basic metabolic panel     Status: Abnormal   Collection Time: 11/18/17  2:50 AM  Result Value Ref Range   Sodium 138 135 - 145 mmol/L   Potassium 3.4 (L) 3.5 - 5.1 mmol/L   Chloride 101 98 - 111 mmol/L   CO2 31 22 - 32 mmol/L   Glucose, Bld 115 (H) 70 - 99 mg/dL   BUN 28 (H) 8 - 23 mg/dL   Creatinine, Ser 8.01 0.61 - 1.24 mg/dL   Calcium 8.3 (L) 8.9 - 10.3 mg/dL   GFR calc non Af Amer >60 >60 mL/min   GFR calc Af Amer >60 >60 mL/min   Anion gap 6 5 - 15  Glucose, capillary     Status: Abnormal   Collection  Time: 11/18/17  7:48 AM  Result Value Ref Range   Glucose-Capillary 121 (H) 70 - 99 mg/dL      [id number])  Mr Brain Wo Contrast  Result Date: 11/17/2017 CLINICAL DATA:  Stroke follow-up EXAM: MRI HEAD WITHOUT CONTRAST TECHNIQUE: Multiplanar, multiecho pulse sequences of the brain and surrounding structures were obtained without intravenous contrast. COMPARISON:  Head CT from 2 days ago.  Brain MRI head 10/05/2004 FINDINGS: Brain: Cortical and subcortical acute infarct in the posterior right frontal lobe measuring up to 2 cm on axial slices. No additional acute infarct is noted. Remote left parietooccipital cortex infarct with cortical laminar necrosis and T1 hyperintensity. There is chronic small vessel ischemia in the cerebral white  matter with confluent gliosis. Dilated perivascular spaces in the deep gray nuclei. Remote microhemorrhage in the left thalamus. No generalized chronic hemorrhagic injury. Vascular: Major flow voids are preserved Skull and upper cervical spine: Asymmetric left degenerative facet spurring with mild C3-4 anterolisthesis. Sinuses/Orbits: Mild mucosal thickening in the small right maxillary sinus. Presumed retention cysts in the floor of the left maxillary sinus. IMPRESSION: 1. Small acute cortical and subcortical infarct in the posterior right frontal lobe. 2. Remote left parietooccipital infarct. 3. Chronic small vessel ischemia in the cerebral white matter. Electronically Signed   By: Marnee Spring M.D.   On: 11/17/2017 14:54   Nm Myocar Multi W/spect W/wall Motion / Ef  Result Date: 11/17/2017 CLINICAL DATA:  83 year old male with palpitations. Diabetes. Lung disease. EXAM: MYOCARDIAL IMAGING WITH SPECT (REST AND PHARMACOLOGIC-STRESS) GATED LEFT VENTRICULAR WALL MOTION STUDY LEFT VENTRICULAR EJECTION FRACTION TECHNIQUE: Standard myocardial SPECT imaging was performed after resting intravenous injection of 10 mCi Tc-20m tetrofosmin. Subsequently,  intravenous infusion of Lexiscan was performed under the supervision of the Cardiology staff. At peak effect of the drug, 30 mCi Tc-61m tetrofosmin was injected intravenously and standard myocardial SPECT imaging was performed. Quantitative gated imaging was also performed to evaluate left ventricular wall motion, and estimate left ventricular ejection fraction. COMPARISON:  None. FINDINGS: Perfusion: Decreased counts within the apical, mid and basilar segment of the inferior wall which are fixed on rest and stress. Decreased counts in the apical segment of the inferolateral wall which are fixed on rest and stress. No evidence reversible ischemia. Wall Motion: LEFT ventricle is dilated at rest and stress. Global hypokinesia. Left Ventricular Ejection Fraction: 28 % End diastolic volume 183 ml End systolic volume 131 ml IMPRESSION: 1. No reversible ischemia. Fixed defect in the inferolateral wall may represent infarction. Decrease counts in the inferior wall are favored diaphragmatic attenuation. 2. Global hypokinesia. LEFT ventricular dilatation at rest and stress. 3. Left ventricular ejection fraction 28% 4. Non invasive risk stratification*: High (based on low ejection fraction). *2012 Appropriate Use Criteria for Coronary Revascularization Focused Update: J Am Coll Cardiol. 2012;59(9):857-881. http://content.dementiazones.com.aspx?articleid=[email or web address] Electronically Signed   By: Genevive Bi M.D.   On: 11/17/2017 12:22     Assessment/Plan: Diagnosis: Posterior right frontal lobe  Labs and images (see above) independently reviewed.  Records reviewed and summated above. Stroke: Continue secondary stroke prophylaxis and Risk Factor Modification listed below:   Antiplatelet therapy:   Blood Pressure Management:  Continue current medication with prn's with permisive HTN per primary team Statin Agent:   Diabetes management:   Left sided hemiparesis: fit for orthosis to prevent contractures (resting  hand splint for day, wrist cock up splint at night) Motor recovery: Fluoxetine  1. Does the need for close, 24 hr/day medical supervision in concert with the patient's rehab needs make it unreasonable for this patient to be served in a less intensive setting? Yes  2. Co-Morbidities requiring supervision/potential complications: acute CHF (Monitor in accordance with increased physical activity and avoid UE resistance excercises), T2 DM (Monitor in accordance with exercise and adjust meds as necessary), COPD (monitor RR with increased physical activity), A. fib with RVR (cont meds, monitor HR with increased activity), history of CVA, hypokalemia (continue to monitor and replete as necessary), ABLA (transfuse if necessary to ensure appropriate perfusion for increased activity tolerance) 3. Due to safety, disease management and patient education, does the patient require 24 hr/day rehab nursing? Yes 4. Does the patient require coordinated care of a physician, rehab nurse, PT (1-2 hrs/day, 5  days/week) and OT (1-2 hrs/day, 5 days/week) to address physical and functional deficits in the context of the above medical diagnosis(es)? Yes Addressing deficits in the following areas: balance, endurance, locomotion, strength, transferring, bathing, dressing, toileting and psychosocial support 5. Can the patient actively participate in an intensive therapy program of at least 3 hrs of therapy per day at least 5 days per week? Yes 6. The potential for patient to make measurable gains while on inpatient rehab is excellent 7. Anticipated functional outcomes upon discharge from inpatient rehab are supervision  with PT, supervision with OT, n/a with SLP. 8. Estimated rehab length of stay to reach the above functional goals is: 12-16 days. 9. Anticipated D/C setting: Home 10. Anticipated post D/C treatments: HH therapy and Home excercise program 11. Overall Rehab/Functional Prognosis: good  RECOMMENDATIONS: This  patient's condition is appropriate for continued rehabilitative care in the following setting: CIR Patient has agreed to participate in recommended program. Yes Note that insurance prior authorization may be required for reimbursement for recommended care.  Comment: Rehab Admissions Coordinator to follow up.   I have personally performed a face to face diagnostic evaluation, including, but not limited to relevant history and physical exam findings, of this patient and developed relevant assessment and plan.  Additionally, I have reviewed and concur with the physician assistant's documentation above.   Maryla Morrow, MD, ABPMR Jacquelynn Cree, PA-C 11/18/2017        Revision History                        Routing History

## 2017-11-20 NOTE — Progress Notes (Signed)
Standley Brooking, RN  Rehab Admission Coordinator  Physical Medicine and Rehabilitation  PMR Pre-admission  Signed  Date of Service:  11/19/2017 3:35 PM       Related encounter: ED to Hosp-Admission (Current) from 11/14/2017 in [place] [facility]      Signed         Show:Clear all [x] Manual[x] Template[x] Copied  Added by: [x] Standley Brooking, RN  [] Hover for details PMR Admission Coordinator Pre-Admission Assessment  Patient: Chad Franklin is an 83 y.o., male MRN: [id number] DOB: 1934-07-18 Height:   Weight: 104.7 kg                                                                                                                                                  Insurance Information HMO: yes    PPO:      PCP:      IPA:      80/20:      OTHER: medicare advantage plan PRIMARY: [employer]      Policy#: [id number]      Subscriber: pt CM Name: Dot      Phone#: [phone] ext [phone]    Fax#: [phone] Pre-Cert#: [id number]    Approved for 7 days with f/u Karle Barr. Phone ext [phone]  Employer: self employed Benefits:  Phone #: [phone]     Name: 11/19/2017 Eff. Date: 03/19/2017     Deduct: none      Out of Pocket Max: $3400      Life Max: none CIR: $295 co pay per day days 1 until 6      SNF: no co pay days 1 until 20; $172 co pay per day days 21 until 100 Outpatient: $40 copy per visit     Co-Pay: visits per medical neccesity Home Health: 100%      Co-Pay: visits per medical neccesity DME: 80%     Co-Pay: 20 % Providers: in network  SECONDARY: none       Medicaid Application Date:       Case Manager:  Disability Application Date:       Case Worker:   Emergency Chief Operating Officer Information    Name Relation Home Work Mobile   [name], [name]  [phone]       Current Medical History  Patient Admitting Diagnosis: right MCA infarct  History of Present Illness: Chad Franklin a 83 y.o.malewith history of T2DM,  COPD, A. fib on chronic Coumadin, CVA; who was admitted on 11/14/2017 with sudden onset of left-sided weakness.CTA head neck negative for large vessel occlusion or flow-limiting stenosis, severe left C3/4 neuro foraminal narrowing.INR at admission 1.1 and he received IV TPA. 2D echo done showing EF of 35 to 40% apical and anteroseptal myocardialakinesis.He had acute onset of vomiting and was negative for bleed.MRI of  brain reviewed, showing, right frontal CVA. Per report, done revealing small acute cortical/subcortical infarct in posterior right frontal lobe and remote left parieto-occipital infarct. Dr. Deirdre Peer for input on theDOACandrecommended Myoview for work-up due to decline in EF and new acute CHF.Myoview showed EF of 28% with global hypokinesis,no reversible ischemia and fixed inferolateral lateral wall may represent infarct.A. fib with RVR treated withmetoprolol.Cardiology consulted with med changes to coreg in 9/3. recommend avoid albuterol nebulizer. Plan to switch to ipratropium for wheezing. Continue po lasix.  Complete NIHSS TOTAL: 7  Past Medical History      Past Medical History:  Diagnosis Date  . Bilateral swelling of feet   . COPD (chronic obstructive pulmonary disease) (HCC)   . Depression   . Diabetes mellitus   . GERD (gastroesophageal reflux disease)   . Palpitations   . Stroke [facility])    2    Family History  family history includes Hypertension in his father.  Prior Rehab/Hospitalizations:  Has the patient had major surgery during 100 days prior to admission? No  Current Medications   Current Facility-Administered Medications:  .   stroke: mapping our early stages of recovery book, , Does not apply, Once, Aroor, Georgiana Spinner R, MD .  acetaminophen (TYLENOL) tablet 650 mg, 650 mg, Oral, Q4H PRN, 650 mg at 11/20/17 0554 **OR** acetaminophen (TYLENOL) solution 650 mg, 650 mg, Per Tube, Q4H PRN **OR** acetaminophen (TYLENOL)  suppository 650 mg, 650 mg, Rectal, Q4H PRN, Aroor, Georgiana Spinner R, MD .  apixaban (ELIQUIS) tablet 5 mg, 5 mg, Oral, BID, Marvel Plan, MD, 5 mg at 11/20/17 0831 .  bisacodyl (DULCOLAX) suppository 10 mg, 10 mg, Rectal, Once, Marvel Plan, MD .  carvedilol (COREG) tablet 12.5 mg, 12.5 mg, Oral, BID WC, Kroeger, Krista M., PA-C, 12.5 mg at 11/20/17 0831 .  digoxin (LANOXIN) tablet 0.125 mg, 0.125 mg, Oral, Daily, Hochrein, James, MD, 0.125 mg at 11/20/17 1147 .  furosemide (LASIX) tablet 40 mg, 40 mg, Oral, Daily, Duke Salvia, MD, 40 mg at 11/20/17 0831 .  insulin aspart (novoLOG) injection 0-15 Units, 0-15 Units, Subcutaneous, TID WC, Marvel Plan, MD, 2 Units at 11/20/17 1147 .  insulin aspart (novoLOG) injection 0-5 Units, 0-5 Units, Subcutaneous, QHS, Xu, Jindong, MD .  ipratropium (ATROVENT) nebulizer solution 0.5 mg, 0.5 mg, Nebulization, Q6H PRN, Micki Riley, MD .  levalbuterol (XOPENEX) nebulizer solution 1.25 mg, 1.25 mg, Nebulization, Q6H PRN, Pearlean Brownie, Pramod S, MD .  losartan (COZAAR) tablet 25 mg, 25 mg, Oral, Daily, Bensimhon, Bevelyn Buckles, MD, 25 mg at 11/20/17 0831 .  multivitamin with minerals tablet 1 tablet, 1 tablet, Oral, Daily, Marvel Plan, MD, 1 tablet at 11/20/17 0831 .  ondansetron (ZOFRAN) injection 4 mg, 4 mg, Intravenous, Q4H PRN, Marvel Plan, MD, 4 mg at 11/15/17 1508 .  PARoxetine (PAXIL) tablet 20 mg, 20 mg, Oral, Daily, Marvel Plan, MD, 20 mg at 11/20/17 0981 .  potassium chloride SA (K-DUR,KLOR-CON) CR tablet 20 mEq, 20 mEq, Oral, Daily, Rinehuls, David L, PA-C, 20 mEq at 11/20/17 0833 .  pravastatin (PRAVACHOL) tablet 10 mg, 10 mg, Oral, Daily, Marvel Plan, MD, 10 mg at 11/20/17 1914 .  senna-docusate (Senokot-S) tablet 1 tablet, 1 tablet, Oral, QHS PRN, Aroor, Dara Lords, MD, 1 tablet at 11/19/17 2128  Patients Current Diet:     Diet Order                  Diet - low sodium heart healthy  Diet heart healthy/carb modified Room service appropriate?  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  Independent  Indoor Mobility: Did the patient need assistance with walking from room to room (with or without device)? Independent  Stairs: Did the patient need assistance with internal or external stairs (with or without device)? Independent  Functional Cognition: Did the patient need help planning regular tasks such as shopping or remembering to take medications? Independent  Current Functional Level Cognition  Arousal/Alertness: Awake/alert Overall Cognitive Status: Within Functional Limits for tasks assessed Orientation Level: Oriented X4 Safety/Judgement: Decreased awareness of deficits, Decreased awareness of safety General Comments: Some intermittent cues required throughout session and during functional task performance Attention: Selective Selective Attention: Impaired Selective Attention Impairment: Verbal basic, Functional basic(selective digit repetition impaired, distracted by sounds) Memory: Impaired Memory Impairment: Storage deficit, Decreased recall of new information, Decreased short term memory Decreased Short Term Memory: Verbal  basic Awareness: Impaired Awareness Impairment: Intellectual impairment Problem Solving: Impaired Problem Solving Impairment: Verbal basic Executive Function: Reasoning Reasoning: Impaired Reasoning Impairment: Verbal basic Safety/Judgment: Impaired Comments: decreased awareness of deficits    Extremity Assessment (includes Sensation/Coordination)  Upper Extremity Assessment: LUE deficits/detail LUE Deficits / Details: shoulder and elbow 2+/5, wrist and hand 1/5, sensation intact LUE Sensation: WNL LUE Coordination: decreased fine motor, decreased gross motor  Lower Extremity Assessment: Defer to PT evaluation RLE Deficits / Details: WNL LLE Deficits / Details: hip flexion 4/5, hip abduction/ADD 5/5, knee extension 5/5. Pt with functional strength and sensation with testing but with mobility lack of coordination and control of LLE noted LLE Sensation: WNL LLE Coordination: WNL    ADLs  Overall ADL's : Needs assistance/impaired Eating/Feeding: Moderate assistance Eating/Feeding Details (indicate cue type and reason): assist for BUE tasks (cutting) educated on using RUE to place LUE in helpful position Grooming: Wash/dry hands, Wash/dry face, Brushing hair, Moderate assistance, Standing Grooming Details (indicate cue type and reason): mod A and cues for standing balance; Pt attempting to use RUE to engage LUE in tasks - perfoming most single handledly (wringing out wash cloth, using wash cloth to wash LUE) Upper Body Bathing: Minimal assistance, Sitting Lower Body Bathing: Moderate assistance, Sit to/from stand Upper Body Dressing : Maximal assistance, Sitting Upper Body Dressing Details (indicate cue type and reason): EOB to don hospital gown like robe Lower Body Dressing: Sitting/lateral leans, Maximal assistance Lower Body Dressing Details (indicate cue type and reason): to don socks Toilet Transfer: Moderate assistance, +2 for physical assistance, +2 for safety/equipment,  Ambulation Toilet Transfer Details (indicate cue type and reason): 2 person HHA Toileting- Clothing Manipulation and Hygiene: Moderate assistance, Sitting/lateral lean Functional mobility during ADLs: Moderate assistance, +2 for physical assistance, +2 for safety/equipment(2 person HHA)    Mobility  Overal bed mobility: Needs Assistance Bed Mobility: Supine to Sit, Sit to Supine Supine to sit: Min assist Sit to supine: Min assist General bed mobility comments: received in chair    Transfers  Overall transfer level: Needs assistance Equipment used: 2 person hand held assist Transfers: Sit to/from Stand Sit to Stand: Min assist General transfer comment: Min assist for sit to stand requiring verbal and tactile cues for left hand placement to push from sit to stand.    Ambulation / Gait / Stairs / Wheelchair Mobility  Ambulation/Gait Ambulation/Gait assistance: Mod assist Gait Distance (Feet): 40 Feet(x3) Assistive device: 1 person hand held assist(HHA left and rail on right) Gait Pattern/deviations: Step-through pattern, Decreased stride length, Decreased weight shift to right General Gait Details: patient completed multiple trials with varying gait speed and assistance levels. Patient able to initate weight shift with external support on right side however when external support is removed patient is unable to initate weight shift and requires facilitation. Music utilized for auditory pacing, external visual cues via line on floor for step  control Gait velocity: decreased Gait velocity interpretation: <1.8 ft/sec, indicate of risk for recurrent falls    Posture / Balance Dynamic Sitting Balance Sitting balance - Comments: patient able to perform static sitting without difficulty, some modest challenge tolerated Balance Overall balance assessment: Needs assistance Sitting-balance support: Feet supported Sitting balance-Leahy Scale: Fair Sitting balance - Comments: patient able  to perform static sitting without difficulty, some modest challenge tolerated Postural control: Right lateral lean Standing balance support: During functional activity Standing balance-Leahy Scale: Poor Standing balance comment: required UE support in static standing    Special needs/care consideration BiPAP/CPAP  N/a CPM  N/a Continuous Drip IV n/a Dialysis  N/a Life Vest n/a Oxygen n/a Special Bed n/a Trach Size n/a Wound Vac n/a Skin tear to right arm Bowel mgmt: continent LBM 9/3 Bladder mgmt: external catheter Diabetic mgmt Hgb A1c 5.8   Previous Home Environment Living Arrangements: Spouse/significant other  Lives With: Spouse Available Help at Discharge: Family, Available 24 hours/day Type of Home: House Home Layout: One level Home Access: Stairs to enter Secretary/administrator of Steps: 3 Bathroom Shower/Tub: Engineer, manufacturing systems: Standard Bathroom Accessibility: Yes How Accessible: Accessible via walker Home Care Services: No Additional Comments: 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"  Discharge Living Setting Plans for Discharge Living Setting: Patient's home, Lives with (comment)(wife) Type of Home at Discharge: House Discharge Home Layout: One level Discharge Home Access: Stairs to enter Entrance Stairs-Rails: None Entrance Stairs-Number of Steps: 3 Discharge Bathroom Shower/Tub: Tub/shower unit, Curtain Discharge Bathroom Toilet: Standard Discharge Bathroom Accessibility: Yes How Accessible: Accessible via walker Does the patient have any problems obtaining your medications?: No  Social/Family/Support Systems Patient Roles: Spouse, 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 Information: Wilma, wife Anticipated Caregiver: wife and family Anticipated Industrial/product designer Information: see above Ability/Limitations of Caregiver: none Caregiver Availability: 24/7 Discharge Plan Discussed with Primary Caregiver: Yes Is Caregiver In Agreement with  Plan?: Yes Does Caregiver/Family have Issues with Lodging/Transportation while Pt is in Rehab?: No  Goals/Additional Needs Patient/Family Goal for Rehab: supervision with PT, OT, and SLP Expected length of stay: ELOS 12 to 16 days Pt/Family Agrees to Admission and willing to participate: Yes Program Orientation Provided & Reviewed with Pt/Caregiver Including Roles  & Responsibilities: Yes  Decrease burden of Care through IP rehab admission: n/a  Possible need for SNF placement upon discharge: not anticipated  Patient Condition: This patient's condition remains as documented in the consult  dated 11/18/2017, in which the Rehabilitation Physician determined and documented that the patient's condition is appropriate for intensive rehabilitative care in an inpatient rehabilitation facility. Will admit to inpatient rehab today.  Preadmission Screen Completed By:  Clois Dupes, 11/20/2017 4:11 PM ______________________________________________________________________   Discussed status with Dr. Allena Katz on 11/20/2017 at  1610 and received telephone approval for admission today.  Admission Coordinator:  Clois Dupes, time 1601 Date 11/20/2017           Cosigned by: Marcello Fennel, MD at 11/20/2017 4:29 PM  Revision History

## 2017-11-20 NOTE — Progress Notes (Signed)
STROKE TEAM PROGRESS NOTE   SUBJECTIVE (INTERVAL HISTORY) Family at the bedside. Myoview stress test  Did not show reversible ischemia.MRi showed small small right frontal infarcts OBJECTIVE Vitals:   11/20/17 0319 11/20/17 0732 11/20/17 0844 11/20/17 1251  BP: (!) 124/91 124/79  (P) 92/60  Pulse: 94 (!) 104  (P) 95  Resp: 18 18  (P) 18  Temp: 98.2 F (36.8 C) 98.4 F (36.9 C)  (P) 97.9 F (36.6 C)  TempSrc: Oral Oral  (P) Oral  SpO2: 95% 93% 94% (P) 91%  Weight:        CBC:  Recent Labs  Lab 11/14/17 1812  11/17/17 0315 11/18/17 0250  WBC 7.9   < > 9.0 8.4  NEUTROABS 5.1  --   --   --   HGB 14.3   < > 12.6* 12.0*  HCT 46.5   < > 41.7 38.9*  MCV 94.7   < > 95.6 95.1  PLT 222   < > 154 144*   < > = values in this interval not displayed.    Basic Metabolic Panel:  Recent Labs  Lab 11/19/17 0452 11/20/17 0508  NA 139 139  K 3.6 3.7  CL 101 102  CO2 30 27  GLUCOSE 120* 124*  BUN 23 19  CREATININE 0.91 0.83  CALCIUM 8.3* 8.3*    Lipid Panel:     Component Value Date/Time   CHOL 116 11/15/2017 0342   TRIG 54 11/15/2017 0342   HDL 39 (L) 11/15/2017 0342   CHOLHDL 3.0 11/15/2017 0342   VLDL 11 11/15/2017 0342   LDLCALC 66 11/15/2017 0342   HgbA1c:  Lab Results  Component Value Date   HGBA1C 5.8 (H) 11/15/2017   Urine Drug Screen: No results found for: LABOPIA, COCAINSCRNUR, LABBENZ, AMPHETMU, THCU, LABBARB  Alcohol Level No results found for: ETH  IMAGING  Ct Angio Head W Or Wo Contrast Ct Angio Neck W Or Wo Contrast 11/14/2017   ADDENDUM:  1 cm sclerotic lesion T1 most compatible with bone island though, if there is a history of cancer, consider bone scan.  11/14/2017  IMPRESSION:   CTA NECK:  1. No hemodynamically significant stenosis ICA's.  2. Patent vertebral arteries without dissection.  3. Severe LEFT C3-4 neural foraminal narrowing.   CTA HEAD:  1. No emergent large vessel occlusion or flow-limiting stenosis.  2. Mild intracranial  atherosclerosis. Aortic Atherosclerosis (ICD10-I70.0). Emphysema (ICD10-J43.9).   Ct Head Code Stroke Wo Contrast 11/14/2017  IMPRESSION:  1. Age indeterminate basal ganglia lacunar infarcts superimposed on old lacunar infarcts.  2. Old small LEFT parietoccipital lobe infarct.  3. Moderate chronic small vessel ischemic changes.  4. ASPECTS is 9.   MRI Small acute cortical and subcortical infarct in the posterior right frontal lobe. Transthoracic Echocardiogram  11/15/2017 Left ventricle: The cavity size was mildly dilated. There was   mild focal basal hypertrophy of the septum. Systolic function was   moderately reduced. The estimated ejection fraction was in the   range of 35% to 40%. - Regional wall motion abnormality: Akinesis of the mid anterior   and basal-mid anteroseptal myocardium; hypokinesis of the apical   anterior, mid inferoseptal, apical inferior, apical septal,   apical lateral, and apical myocardium   Myoview Stress Test 11/17/2017 IMPRESSION: 1. No reversible ischemia. Fixed defect in the inferolateral wall may represent infarction. Decrease counts in the inferior wall are favored diaphragmatic attenuation.  2. Global hypokinesia. LEFT ventricular dilatation at rest and stress.  3. Left  ventricular ejection fraction 28%  4. Non invasive risk stratification*: High (based on low ejection fraction).    PHYSICAL EXAM  Temp:  [97.5 F (36.4 C)-98.5 F (36.9 C)] (P) 97.9 F (36.6 C) (09/04 1251) Pulse Rate:  [64-107] (P) 95 (09/04 1251) Resp:  [15-18] (P) 18 (09/04 1251) BP: (117-124)/(79-106) (P) 92/60 (09/04 1251) SpO2:  [91 %-98 %] (P) 91 % (09/04 1251)  General - Well nourished, well developed, in no apparent distress.  Ophthalmologic - fundi not visualized due to noncooperation.  Cardiovascular - irregularly irregular heart rate and rhythm.  Mental Status -  Level of arousal and orientation to place, and person were intact, however not  oriented to time. Language including expression, naming, repetition, comprehension was assessed and found intact.  Cranial Nerves II - XII - II - Visual field intact OU. III, IV, VI - Extraocular movements intact. V - Facial sensation intact bilaterally. VII - subtle left nasolabial fold flattening. VIII - Hearing & vestibular intact bilaterally. X - Palate elevates symmetrically. XI - Chin turning & shoulder shrug intact bilaterally. XII - Tongue protrusion intact.  Motor Strength - The patient's strength was normal in RUE and RLE, however, LUE 3-/5 proximal and distal, LLE 5-/5 proximal and distal and pronator drift was absent.  Bulk was normal and fasciculations were absent.   Motor Tone - Muscle tone was assessed at the neck and appendages and was normal.  Reflexes - The patient's reflexes were symmetrical in all extremities and he had no pathological reflexes.  Sensory - Light touch, temperature/pinprick were assessed and were symmetrical.    Coordination - The patient had normal movements in the right hand with no ataxia or dysmetria.  Tremor was absent.  Gait and Station - deferred.   ASSESSMENT/PLAN [name] is a 83 y.o. male with history of diabetes mellitus, COPD, afib on coumadin and previous stroke in 2012/2013 presenting with left-sided weakness.  He received IV TPA Thursday 11/14/2017 at 1900 hrs.  Stroke: likely right MCA infarct due to afib with subtherapeutic INR  Resultant  Left hemi-paresis  CT head - Multiple old infarcts - nothing acute.  MRI head - pending - Pt unable to lie flat early this AM  Myoview Stress Test - EF 28%. Non invasive risk stratification*: High (based on low EF).  CTA H&N - no significant stenosis  2D Echo - EF 35-40% akinesis apical and anteroseptal myocardium  LDL - 66  HgbA1c - 5.8  INR 1.1  VTE prophylaxis - SCDs  Diet - Heart healthy / carb modified with thin liquids.  warfarin daily prior to admission, now on  No antithrombotic with 24h of tPA.  Patient counseled to be compliant with his antithrombotic medications  Ongoing aggressive stroke risk factor management  Therapy recommendations: CLR   Disposition:  Pending  Afib with RVR  On coumadin at home, stated compliance  However, INR 1.1 on admission  currently s/p tPA  Warfarin changed to eliquis  RVR - metoprolol 5mg  iv stat  Add metoprolol 25mg  bid for rate control  Hypertension  Stable . Permissive hypertension (OK if < 180/105) but gradually normalize in 5-7 days . Long-term BP goal normotensive  Hyperlipidemia  Lipid lowering medication PTA:  Pravachol 10 mg daily  LDL 66, goal < 70  Current lipid lowering medication: Pravachol 10 mg daily  Continue statin at discharge  Diabetes  HgbA1c 5.8, goal < 7.0  Controlled  SSI  CBG monitoring  Other Stroke Risk Factors  Advanced age  Former cigarette smoker - quit   Hx stroke/TIA in 02/2011 - 03/2011 - CT showed left CMA and right BG old infarcts  Other Active Problems  1 cm sclerotic lesion T1 most compatible with bone island  CHF - improved  Afib with RVR - improved      Hospital day # 6 Continuer therapy. Await rehabilitation decision. Medically stable for transfer to rehabilitation when bed available      Delia Heady, MD Medical Director [facility] Pager: [phone] 11/20/2017 2:50 PM      To contact Stroke Continuity provider, please refer to [email or web address]. After hours, contact General Neurology

## 2017-11-21 ENCOUNTER — Inpatient Hospital Stay (HOSPITAL_COMMUNITY): Payer: Medicare HMO | Admitting: Occupational Therapy

## 2017-11-21 ENCOUNTER — Encounter (HOSPITAL_COMMUNITY): Payer: Self-pay | Admitting: Neurology

## 2017-11-21 ENCOUNTER — Inpatient Hospital Stay (HOSPITAL_COMMUNITY): Payer: Medicare HMO | Admitting: Physical Therapy

## 2017-11-21 ENCOUNTER — Inpatient Hospital Stay (HOSPITAL_COMMUNITY): Payer: Medicare HMO | Admitting: Speech Pathology

## 2017-11-21 ENCOUNTER — Other Ambulatory Visit: Payer: Self-pay

## 2017-11-21 DIAGNOSIS — I4891 Unspecified atrial fibrillation: Secondary | ICD-10-CM

## 2017-11-21 DIAGNOSIS — I10 Essential (primary) hypertension: Secondary | ICD-10-CM

## 2017-11-21 DIAGNOSIS — G479 Sleep disorder, unspecified: Secondary | ICD-10-CM

## 2017-11-21 DIAGNOSIS — D62 Acute posthemorrhagic anemia: Secondary | ICD-10-CM

## 2017-11-21 DIAGNOSIS — I639 Cerebral infarction, unspecified: Secondary | ICD-10-CM

## 2017-11-21 DIAGNOSIS — E119 Type 2 diabetes mellitus without complications: Secondary | ICD-10-CM

## 2017-11-21 LAB — CBC WITH DIFFERENTIAL/PLATELET
Abs Immature Granulocytes: 0.1 10*3/uL (ref 0.0–0.1)
Basophils Absolute: 0 10*3/uL (ref 0.0–0.1)
Basophils Relative: 0 %
EOS ABS: 0.2 10*3/uL (ref 0.0–0.7)
Eosinophils Relative: 2 %
HEMATOCRIT: 39.6 % (ref 39.0–52.0)
Hemoglobin: 12.5 g/dL — ABNORMAL LOW (ref 13.0–17.0)
IMMATURE GRANULOCYTES: 1 %
LYMPHS ABS: 1.2 10*3/uL (ref 0.7–4.0)
Lymphocytes Relative: 12 %
MCH: 29.3 pg (ref 26.0–34.0)
MCHC: 31.6 g/dL (ref 30.0–36.0)
MCV: 92.7 fL (ref 78.0–100.0)
Monocytes Absolute: 1 10*3/uL (ref 0.1–1.0)
Monocytes Relative: 10 %
NEUTROS PCT: 75 %
Neutro Abs: 7.1 10*3/uL (ref 1.7–7.7)
Platelets: 177 10*3/uL (ref 150–400)
RBC: 4.27 MIL/uL (ref 4.22–5.81)
RDW: 13.7 % (ref 11.5–15.5)
WBC: 9.5 10*3/uL (ref 4.0–10.5)

## 2017-11-21 LAB — COMPREHENSIVE METABOLIC PANEL
ALBUMIN: 2.7 g/dL — AB (ref 3.5–5.0)
ALT: 29 U/L (ref 0–44)
AST: 35 U/L (ref 15–41)
Alkaline Phosphatase: 64 U/L (ref 38–126)
Anion gap: 9 (ref 5–15)
BILIRUBIN TOTAL: 1.5 mg/dL — AB (ref 0.3–1.2)
BUN: 20 mg/dL (ref 8–23)
CO2: 26 mmol/L (ref 22–32)
Calcium: 8 mg/dL — ABNORMAL LOW (ref 8.9–10.3)
Chloride: 102 mmol/L (ref 98–111)
Creatinine, Ser: 0.88 mg/dL (ref 0.61–1.24)
GFR calc Af Amer: >60 mL/min (ref >60)
GFR calc non Af Amer: >60 mL/min (ref >60)
GLUCOSE: 106 mg/dL — AB (ref 70–99)
POTASSIUM: 3.8 mmol/L (ref 3.5–5.1)
Sodium: 137 mmol/L (ref 135–145)
Total Protein: 6.5 g/dL (ref 6.5–8.1)

## 2017-11-21 LAB — GLUCOSE, CAPILLARY
GLUCOSE-CAPILLARY: 104 mg/dL — AB (ref 70–99)
GLUCOSE-CAPILLARY: 118 mg/dL — AB (ref 70–99)
GLUCOSE-CAPILLARY: 136 mg/dL — AB (ref 70–99)
Glucose-Capillary: 117 mg/dL — ABNORMAL HIGH (ref 70–99)

## 2017-11-21 MED ORDER — APIXABAN 5 MG PO TABS: 5.0000 mg | ORAL_TABLET | Freq: Two times a day (BID) | ORAL | 0 refills | Status: DC

## 2017-11-21 MED ORDER — MELATONIN 3 MG PO TABS
1.5000 mg | ORAL_TABLET | Freq: Every day | ORAL | Status: DC
  Administered 2017-11-21 – 2017-11-24 (×4): 1.5 mg via ORAL
  Filled 2017-11-21 (×4): qty 0.5

## 2017-11-21 NOTE — Progress Notes (Signed)
Nutrition Brief Note  Patient identified on the Malnutrition Screening Tool (MST) Report  Wt Readings from Last 15 Encounters:  11/20/17 104.5 kg  11/14/17 104.7 kg    Body mass index is 35.03 kg/m. Patient meets criteria for class I obesity based on current BMI.   Current diet order is heart healthy/carbohydrate modified, patient is consuming approximately 100% of meals at this time. Appetite has been good currently and PTA with no other difficulties. Labs and medications reviewed.   No nutrition interventions warranted at this time. If nutrition issues arise, please consult RD.   Roslyn Smiling, MS, RD, LDN Pager # [phone] After hours/ weekend pager # [phone]

## 2017-11-21 NOTE — Progress Notes (Signed)
Chad Franklin PHYSICAL MEDICINE & REHABILITATION     PROGRESS NOTE  Subjective/Complaints:  Patient seen sitting up in bed this morning. He states he slept fairly overnight. He notes this is how it has been since he has been in the hospital. He is ready to begin therapies.  ROS: denies CP, SOB, nausea, vomiting, diarrhea.  Objective: Vital Signs: Blood pressure (!) 115/93, pulse (!) 132, temperature 98.3 F (36.8 C), temperature source Oral, weight 104.5 kg, SpO2 96 %. Dg Chest 2 View  Result Date: 11/19/2017 CLINICAL DATA:  Recent stroke, RIGHT lower chest and abdominal soreness, ongoing shortness of breath, and dry cough EXAM: CHEST - 2 VIEW COMPARISON:  11/15/2017 FINDINGS: Enlargement of cardiac silhouette. Mediastinal contours and pulmonary vascularity normal. Bronchitic changes without infiltrate, pleural effusion or pneumothorax. Bones demineralized. IMPRESSION: Enlargement of cardiac silhouette. Bronchitic changes without infiltrate. Electronically Signed   By: Ulyses Southward M.D.   On: 11/19/2017 16:32   Recent Labs    11/21/17 0548  WBC 9.5  HGB 12.5*  HCT 39.6  PLT 177   Recent Labs    11/20/17 0508 11/21/17 0548  NA 139 137  K 3.7 3.8  CL 102 102  GLUCOSE 124* 106*  BUN 19 20  CREATININE 0.83 0.88  CALCIUM 8.3* 8.0*   CBG (last 3)  Recent Labs    11/20/17 1645 11/20/17 2106 11/21/17 0654  GLUCAP 123* 112* 104*    Wt Readings from Last 3 Encounters:  11/20/17 104.5 kg  11/14/17 104.7 kg    Physical Exam:  BP (!) 115/93 (BP Location: Right Wrist)   Pulse (!) 132   Temp 98.3 F (36.8 C) (Oral)   Wt 104.5 kg   SpO2 96%  Constitutional: He appears well-developed and well-nourished. NAD. HENT: Normocephalic and atraumatic.  Eyes: EOM are normal. No discharge.  Cardiovascular: Irregularly irregular. No JVD. Respiratory: Effort normal. He has wheezes (audible). + [place]. GI: Bowel sounds are normal. nondistended. Musculoskeletal: No edema or tenderness in  extremities.  Neurological: He is alert.  HOH Motor: Right upper extremity 5/5 proximal distal Right lower extremity: 4/5 proximal distal Left upper extremity: 2 -/5 proximal distal with apraxia, stable Left lower extremity: 4 -/5 proximal to distal, stable  Skin: Skin is warm and dry.  Psychiatric: His speech is delayed. He is slowed.    Assessment/Plan: 1. Functional deficits secondary to small acute cortical/subcortical infarct and posterior right frontal lobe and remote left parieto-occipital infarct which require 3+ hours per day of interdisciplinary therapy in a comprehensive inpatient rehab setting. Physiatrist is providing close team supervision and 24 hour management of active medical problems listed below. Physiatrist and rehab team continue to assess barriers to discharge/monitor patient progress toward functional and medical goals.  Function:  Bathing Bathing position      Bathing parts      Bathing assist        Upper Body Dressing/Undressing Upper body dressing                    Upper body assist        Lower Body Dressing/Undressing Lower body dressing                                  Lower body assist        Toileting Toileting          Toileting assist     Transfers Chair/bed transfer  Secondary school teacher Comprehension    Expression    Social Interaction    Problem Solving    Memory      Medical Problem List and Plan: 1.  Left-sided weakness as well as cognitive deficits  secondary to small acute cortical/subcortical infarct and posterior right frontal lobe and remote left parieto-occipital infarct.  Begin CIR 2.  DVT Prophylaxis/Anticoagulation: Pharmaceutical: Other (comment)--Apixaban.  3. Pain Management: N/A 4. Mood: LCSW to follow for evaluation and support.  5. Neuropsych: This patient is not fully capable of making decisions on his own  behalf. 6. Skin/Wound Care: Routine pressure relief measures.  7. Fluids/Electrolytes/Nutrition: Monitor I/O.   BMP within acceptable range on 9/5 8. HTN: Monitor BP bid. On coreg, Cozaar and Lasix.   Monitor with increased mobility 9. T2DM?: Has been off medications X 1 year. Hgb A1C- 5.8.    Will monitor BS ac/hs for 48 hrs for trends with increased activity.  10. COPD: SOB treated with ipratropium and Xopenex. Cough resolving. Will monitor with increase in activity.     Currently on supplemental oxygen which she is not on at home. Will wean as tolerated 11. A fib with RVR: Monitor HR bid--continue coreg, digoxin and apixaban.  12.  H/o depression: managed with paxil  13. Sleep disturbance:  Melatonin started on 9/5 14. Acute blood loss anemia  Hemoglobin 12.5 on 9/5  Continue to monitor  LOS (Days) 1 A FACE TO FACE EVALUATION WAS PERFORMED  Ankit Karis Juba 11/21/2017 9:03 AM

## 2017-11-21 NOTE — Care Management Note (Signed)
[facility] Individual Statement of Services  Patient Name:  Chad Franklin  Date:  11/21/2017  Welcome to the [facility]patient [facility].  Our goal is to provide you with an individualized program based on your diagnosis and situation, designed to meet your specific needs.  With this comprehensive rehabilitation program, you will be expected to participate in at least 3 hours of rehabilitation therapies Monday-Friday, with modified therapy programming on the weekends.  Your rehabilitation program will include the following services:  Physical Therapy (PT), Occupational Therapy (OT), Speech Therapy (ST), 24 hour per day rehabilitation nursing, Case Management (Social Worker), Rehabilitation Medicine, Nutrition Services and Pharmacy Services  Weekly team conferences will be held on Wednesday to discuss your progress.  Your Social Worker will talk with you frequently to get your input and to update you on team discussions.  Team conferences with you and your family in attendance may also be held.  Expected length of stay: 18-20 days  Overall anticipated outcome: supervision-Contact guard assist  Depending on your progress and recovery, your program may change. Your Social Worker will coordinate services and will keep you informed of any changes. Your Social Worker's name and contact numbers are listed  below.  The following services may also be recommended but are not provided by the Inpatient [facility]:   Driving Evaluations  Home Health Rehabiltiation Services  Outpatient Rehabilitation Services  Vocational Rehabilitation   Arrangements will be made to provide these services after discharge if needed.  Arrangements include referral to agencies that provide these services.  Your insurance has been verified to be:  [employer] Your primary doctor is:  Harrold Donath Conroy-PA  Pertinent information will be shared with your doctor and your insurance  company.  Social Worker:  Dossie Der, SW [phone] or ([phone]  Information discussed with and copy given to patient by: [name] Chris, 11/21/2017, 9:04 AM

## 2017-11-21 NOTE — H&P (Signed)
Physical Medicine and Rehabilitation Admission H&P    Chief Complaint  Patient presents with  . Functional decline due to stroke    HPI:  Chad Franklin is an 83 year old male with history of COPD, DM?, prior CVA, A fib- on coumadin who was admitted on 11/14/2017 with sudden onset of left-sided weakness.  History taken from chart review and wife. CT head neck was negative for large vessel occlusion or flow-limiting stenosis and revealed incidental severe left C3/4  neuroforaminal narrowing.  INR subtherapeutic at admission and he received TPA.  2D echo done showing EF of 35 to 40% with apical and anteroseptal myocardial akinesis.  He had sudden onset of vomiting and CT head ordered, reviewed, unremarkable for acute intracranial process. MRI of brain revealed small acute cortical/subcortical infarct and posterior right frontal lobe and remote left parieto-occipital infarct.  Cardiology was consulted for input on DO ACS and recommended Myoview for work-up.  This revealed EF of 28% with global hypokinesis, no reversible ischemia and fixed intra-lateral wall may represent infarct.  He has had issues with A. fib with RVR therefore metoprolol added, Coreg titrated upwards and digoxin added today.  Chest x-ray ordered due to SOB and showed bronchitic changes.  Albuterol d/c to avoid SE of tachycardia.  Therapy ongoing and patient was deficits due to left-sided weakness as well as cognitive deficits.  CIR was recommended for progressive therapies.   Review of Systems  Constitutional: Negative for chills and fever.  HENT: Positive for hearing loss. Negative for tinnitus.   Eyes: Negative for blurred vision and double vision.  Respiratory: Positive for cough (better today), shortness of breath and wheezing.   Cardiovascular: Negative for chest pain, palpitations and leg swelling.  Gastrointestinal: Positive for heartburn. Negative for abdominal pain, constipation and nausea.  Genitourinary: Negative for  dysuria and urgency.  Musculoskeletal: Negative for back pain, joint pain and myalgias.  Skin: Negative for itching and rash.  Neurological: Positive for focal weakness. Negative for dizziness and headaches.  Psychiatric/Behavioral: The patient has insomnia. The patient is not nervous/anxious.   All other systems reviewed and are negative.     Past Medical History:  Diagnosis Date  . Bilateral swelling of feet   . COPD (chronic obstructive pulmonary disease) ([facility])   . Depression   . Diabetes mellitus   . GERD (gastroesophageal reflux disease)   . Palpitations   . Stroke [facility])    2     Past Surgical History:  Procedure Laterality Date  . GALLBLADDER SURGERY      Family History  Problem Relation Age of Onset  . Hypertension Father     Social History:  Married--wife retired and supportive. 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 Illiterate--reports that he is unable to read or write. Per  reports that he has quit smoking. He does not have any smokeless tobacco history on file. He reports that he does not drink alcohol. His drug history is not on file.      Allergies  Allergen Reactions  . Naproxen Sodium Anaphylaxis, Hives, Shortness Of Breath and Swelling    Medications Prior to Admission  Medication Sig Dispense Refill  . acetaminophen (TYLENOL) 500 MG tablet Take 500 mg by mouth 2 (two) times daily.    . furosemide (LASIX) 20 MG tablet Take 40 mg by mouth daily.    Marland Kitchen losartan-hydrochlorothiazide (HYZAAR) 100-12.5 MG tablet Take 1 tablet by mouth daily.    . Multiple Vitamin (MULTIVITAMIN WITH MINERALS)  TABS tablet Take 1 tablet by mouth daily.    Marland Kitchen PARoxetine (PAXIL) 20 MG tablet Take 20 mg by mouth daily.    . pravastatin (PRAVACHOL) 10 MG tablet Take 10 mg by mouth daily.    . verapamil (CALAN-SR) 240 MG CR tablet Take 240 mg by mouth daily.    Marland Kitchen warfarin (COUMADIN) 5 MG tablet Take 2.5-5 mg by mouth See admin instructions. Take 1/2 tablet (2.28m) Mondays and  1 tablet (53m every other day of the week.      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"  Lives With: Spouse   Functional History: Prior Function Level of Independence: Independent Comments: drives, still works with son  Functional Status:  Mobility: Bed Mobility Overal bed mobility: Needs Assistance Bed Mobility: Supine to Sit, Sit to Supine Supine to sit: Min assist Sit to supine: Min assist General bed mobility comments: received in chair Transfers Overall transfer level: Needs assistance Equipment used: 2 person hand held assist Transfers: Sit to/from Stand Sit to Stand: Min assist General transfer comment: Min assist for sit to stand requiring verbal and tactile cues for left hand placement to push from sit to stand. Ambulation/Gait Ambulation/Gait assistance: Mod assist Gait Distance (Feet): 40 Feet(x3) Assistive device: 1 person hand held assist(HHA left and rail on right) Gait Pattern/deviations: Step-through pattern, Decreased stride length, Decreased weight shift to right General Gait Details: patient completed multiple trials with varying gait speed and assistance levels. Patient able to initate weight shift with external support on right side however when external support is removed patient is unable to initate weight shift and requires facilitation. Music utilized for auditory pacing, external visual cues via line on floor for step   control Gait velocity: decreased Gait velocity interpretation: <1.8 ft/sec, indicate of risk for recurrent falls    ADL: ADL Overall ADL's : Needs assistance/impaired Eating/Feeding: Moderate assistance Eating/Feeding Details (indicate cue type and reason): assist for BUE tasks (cutting) educated on using RUE to place LUE in helpful position Grooming: Wash/dry hands, Wash/dry face, Brushing hair, Moderate assistance, Standing Grooming Details (indicate cue type and reason): mod A and cues for standing balance; Pt attempting to use RUE to engage LUE in tasks - perfoming most single handledly (wringing out wash cloth, using wash cloth to wash LUE) Upper Body Bathing: Minimal assistance, Sitting Lower Body Bathing: Moderate assistance, Sit to/from stand Upper Body Dressing : Maximal assistance, Sitting Upper Body Dressing Details (indicate cue type and reason): EOB to don hospital gown like robe Lower Body Dressing: Sitting/lateral leans, Maximal assistance Lower Body Dressing Details (indicate cue type and reason): to don socks Toilet Transfer: Moderate assistance, +2 for physical assistance, +2 for safety/equipment, Ambulation Toilet Transfer Details (indicate cue type and reason): 2 person HHA Toileting- Clothing Manipulation and Hygiene: Moderate assistance, Sitting/lateral lean Functional mobility during ADLs: Moderate assistance, +2 for physical assistance, +2 for safety/equipment(2 person HHA)  Cognition: Cognition Overall Cognitive Status: Within Functional Limits for tasks assessed Arousal/Alertness: Awake/alert Orientation Level: Oriented X4 Attention: Selective Selective Attention: Impaired Selective Attention Impairment: Verbal basic, Functional basic(selective digit repetition  impaired, distracted by sounds) Memory: Impaired Memory Impairment: Storage deficit, Decreased recall of new information, Decreased short term memory Decreased Short Term Memory: Verbal  basic Awareness: Impaired Awareness Impairment: Intellectual impairment Problem Solving: Impaired Problem Solving Impairment: Verbal basic Executive Function: Reasoning Reasoning: Impaired Reasoning Impairment: Verbal basic Safety/Judgment: Impaired Comments: decreased awareness of deficits Cognition Arousal/Alertness: Awake/alert Behavior During Therapy: WFL for tasks assessed/performed Overall Cognitive Status: Within Functional Limits for tasks assessed Area of Impairment: Problem solving Safety/Judgement: Decreased awareness of deficits, Decreased awareness of safety Problem Solving: Requires verbal cues, Requires tactile cues General Comments: Some intermittent cues required throughout session and during functional task performance   Blood pressure 124/79, pulse (!) 104, temperature 98.4 F (36.9 C), temperature source Oral, resp. rate 18, weight 104.7 kg, SpO2 94 %. Physical Exam  Nursing note and vitals reviewed. Constitutional: He appears well-developed and well-nourished.  HENT:  Head: Normocephalic and atraumatic.  Eyes: EOM are normal. Right eye exhibits no discharge. Left eye exhibits no discharge.  Neck: Normal range of motion. Neck supple.  Cardiovascular: Normal rate.  Irregularly irregular  Respiratory: Effort normal. He has wheezes (audible).  Prolonged expiratory wheezes BUL  GI: Soft. Bowel sounds are normal.  Musculoskeletal:  No edema or tenderness in extremities. Foam dressing left elbow.   Neurological: He is alert.  A&Ox2. Decreased hearing. More appropriate and able to follow simple motor commands.  : Right upper extremity 5/5 proximal distal Right lower extremity: 4/5 proximal distal Left upper extremity: 2 -/5 proximal distal with apraxia Left lower extremity: 4 -/5 proximal to distal  Skin: Skin is warm and dry.  Psychiatric: His speech is delayed. He is slowed.  Atypical affect    Results for orders placed or performed during the hospital  encounter of 11/14/17 (from the past 48 hour(s))  Glucose, capillary     Status: Abnormal   Collection Time: 11/18/17 12:09 PM  Result Value Ref Range   Glucose-Capillary 127 (H) 70 - 99 mg/dL   Comment 1 Notify RN    Comment 2 Document in Chart   Glucose, capillary     Status: Abnormal   Collection Time: 11/18/17  6:36 PM  Result Value Ref Range   Glucose-Capillary 136 (H) 70 - 99 mg/dL  Glucose, capillary     Status: Abnormal   Collection Time: 11/18/17  9:56 PM  Result Value Ref Range   Glucose-Capillary 122 (H) 70 - 99 mg/dL   Comment 1 Notify RN    Comment 2 Document in Chart   Basic metabolic panel     Status: Abnormal   Collection Time: 11/19/17  4:52 AM  Result Value Ref Range   Sodium 139 135 - 145 mmol/L   Potassium 3.6 3.5 - 5.1 mmol/L   Chloride 101 98 - 111 mmol/L   CO2 30 22 - 32 mmol/L   Glucose, Bld 120 (H) 70 - 99 mg/dL   BUN 23 8 - 23 mg/dL   Creatinine, Ser 0.91 0.61 - 1.24 mg/dL   Calcium 8.3 (L) 8.9 - 10.3 mg/dL   GFR calc non Af Amer >60 >60 mL/min   GFR calc Af Amer >60 >60 mL/min    Comment: (NOTE) The eGFR has been calculated using the CKD EPI equation. This calculation has not been validated in all clinical situations. eGFR's persistently <60 mL/min signify possible Chronic Kidney Disease.    Anion gap 8 5 - 15    Comment: Performed at [facility] [address], [place], [place]  [address]  Glucose, capillary     Status: Abnormal   Collection Time: 11/19/17  6:35 AM  Result Value Ref Range   Glucose-Capillary 120 (H) 70 - 99 mg/dL   Comment 1 Notify RN    Comment 2 Document in Chart   Glucose, capillary     Status: Abnormal   Collection Time: 11/19/17 11:05 AM  Result Value Ref Range   Glucose-Capillary 142 (H) 70 - 99 mg/dL   Comment 1 Notify RN    Comment 2 Document in Chart   Glucose, capillary     Status: Abnormal   Collection Time: 11/19/17  5:00 PM  Result Value Ref Range   Glucose-Capillary 174 (H) 70 - 99 mg/dL   Comment  1 Notify RN    Comment 2 Document in Chart   Glucose, capillary     Status: Abnormal   Collection Time: 11/19/17  9:27 PM  Result Value Ref Range   Glucose-Capillary 126 (H) 70 - 99 mg/dL  Basic metabolic panel     Status: Abnormal   Collection Time: 11/20/17  5:08 AM  Result Value Ref Range   Sodium 139 135 - 145 mmol/L   Potassium 3.7 3.5 - 5.1 mmol/L   Chloride 102 98 - 111 mmol/L   CO2 27 22 - 32 mmol/L   Glucose, Bld 124 (H) 70 - 99 mg/dL   BUN 19 8 - 23 mg/dL   Creatinine, Ser 0.83 0.61 - 1.24 mg/dL   Calcium 8.3 (L) 8.9 - 10.3 mg/dL   GFR calc non Af Amer >60 >60 mL/min   GFR calc Af Amer >60 >60 mL/min    Comment: (NOTE) The eGFR has been calculated using the CKD EPI equation. This calculation has not been validated in all clinical situations. eGFR's persistently <60 mL/min signify possible Chronic Kidney Disease.    Anion gap 10 5 - 15    Comment: Performed at [facility] [address] [place], [place] [address]  Glucose, capillary     Status: Abnormal   Collection Time: 11/20/17  6:04 AM  Result Value Ref Range   Glucose-Capillary 115 (H) 70 - 99 mg/dL   Comment 1 Notify RN    Comment 2 Document in Chart    Dg Chest 2 View  Result Date: 11/19/2017 CLINICAL DATA:  Recent stroke, RIGHT lower chest and abdominal soreness, ongoing shortness of breath, and dry cough EXAM: CHEST - 2 VIEW COMPARISON:  11/15/2017 FINDINGS: Enlargement of cardiac silhouette. Mediastinal contours and pulmonary vascularity normal. Bronchitic changes without infiltrate, pleural effusion or pneumothorax. Bones demineralized. IMPRESSION: Enlargement of cardiac silhouette. Bronchitic changes without infiltrate. Electronically Signed   By: Lavonia Dana M.D.   On: 11/19/2017 16:32       Medical Problem List and Plan: 1.  Left-sided weakness as well as cognitive deficits  secondary to small acute cortical/subcortical infarct and posterior right frontal lobe and remote left parieto-occipital  infarct. 2.  DVT Prophylaxis/Anticoagulation: Pharmaceutical: Other (comment)--Apixaban.  3. Pain Management: N/A 4. Mood: LCSW to follow for evaluation and support.  5. Neuropsych: This patient is not fully capable of making decisions on his own behalf. 6. Skin/Wound Care: Routine pressure relief measures.  7. Fluids/Electrolytes/Nutrition: Monitor I/O. Check lytes in am.  8. HTN: Monitor BP bid. On coreg, Cozaar and Lasix.  9. T2DM?: Has been off medications X 1 year. Hgb A1C- 5.8.  Will monitor BS ac/hs for 48 hrs for trends.  10. COPD: SOB treated with ipratropium and Xopenex.  Cough resolving. Will monitor with increase in activity.    11. A fib: Monitor HR bid--continue coreg, digoxin and apixaban.  12.  H/o depression: managed with paxil  13. Insomnia: Will schedule trazodone at nights.    Post Admission Physician Evaluation: 1. Preadmission assessment reviewed and changes made below. 2. Functional deficits secondary  to small acute cortical/subcortical infarct and posterior right frontal lobe and remote left parieto-occipital infarct. 3. Patient is admitted to receive collaborative, interdisciplinary care between the physiatrist, rehab nursing staff, and therapy team. 4. Patient's level of medical complexity and substantial therapy needs in context of that medical necessity cannot be provided at a lesser intensity of care such as a SNF. 5. Patient has experienced substantial functional loss from his/her baseline which was documented above under the "Functional History" and "Functional Status" headings.  Judging by the patient's diagnosis, physical exam, and functional history, the patient has potential for functional progress which will result in measurable gains while on inpatient rehab.  These gains will be of substantial and practical use upon discharge  in facilitating mobility and self-care at the household level. 58. Physiatrist will provide 24 hour management of medical needs as  well as oversight of the therapy plan/treatment and provide guidance as appropriate regarding the interaction of the two. 7. 24 hour rehab nursing will assist with safety, disease management and patient education  and help integrate therapy concepts, techniques,education, etc. 8. PT will assess and treat for/with: Lower extremity strength, range of motion, stamina, balance, functional mobility, safety, adaptive techniques and equipment, coping skills, pain control, education. Goals are: Supervision. 9. OT will assess and treat for/with: ADL's, functional mobility, safety, upper extremity strength, adaptive techniques and equipment, ego support, and community reintegration.   Goals are: Supervision. Therapy may proceed with showering this patient. 10. SLP will assess and treat for/with: cognition.  Goals are: Supervision. 11. Case Management and Social Worker will assess and treat for psychological issues and discharge planning. 12. Team conference will be held weekly to assess progress toward goals and to determine barriers to discharge. 13. Patient will receive at least 3 hours of therapy per day at least 5 days per week. 14. ELOS: 11-15 days.       15. Prognosis:  good  I have personally performed a face to face diagnostic evaluation, including, but not limited to relevant history and physical exam findings, of this patient and developed relevant assessment and plan.  Additionally, I have reviewed and concur with the physician assistant's documentation above.  The patient's status has not changed. The original post admission physician evaluation remains appropriate, and any changes from the pre-admission screening or documentation from the acute chart are noted above.    Delice Lesch, MD, ABPMR Bary Leriche, PA-C 11/20/2017

## 2017-11-21 NOTE — IPOC Note (Signed)
Overall Plan of Care [facility]) Patient Details Name: Chad Franklin MRN: [id number] DOB: 01/13/1935  Admitting Diagnosis: Right frontal stroke  Hospital Problems: Active Problems:   Ischemic stroke of frontal lobe (HCC)   Sleep disturbance     Functional Problem List: Nursing Bowel, Endurance, Medication Management, Motor  PT Balance, Endurance, Motor, Safety  OT Balance, Safety, Cognition, Endurance, Motor, Skin Integrity  SLP    TR         Basic ADL's: OT Grooming, Bathing, Dressing, Toileting     Advanced  ADL's: OT       Transfers: PT Bed Mobility, Bed to Chair, Car, Furniture, Floor  OT Toilet, Research scientist (life sciences): PT Stairs, Psychologist, prison and probation services, Ambulation     Additional Impairments: OT Fuctional Use of Upper Extremity  SLP        TR      Anticipated Outcomes Item Anticipated Outcome  Self Feeding mod I   Swallowing      Basic self-care  supervision   Toileting  supervision   Bathroom Transfers supervision   Bowel/Bladder  continent of bowel and bladder   Transfers  supervision  Locomotion  min assist gait   Communication     Cognition     Pain  no pain  Safety/Judgment  no fall at discharge   Therapy Plan: PT Intensity: Minimum of 1-2 x/day ,45 to 90 minutes PT Frequency: 5 out of 7 days PT Duration Estimated Length of Stay: 18-21 days OT Intensity: Minimum of 1-2 x/day, 45 to 90 minutes OT Frequency: 5 out of 7 days OT Duration/Estimated Length of Stay: 14-18 days       Team Interventions: Nursing Interventions Patient/Family Education, Bowel Management, Disease Management/Prevention, Pain Management, Medication Management, Discharge Planning, Psychosocial Support  PT interventions Disease management/prevention, Ambulation/gait training, Pain management, Stair training, Visual/perceptual remediation/compensation, Wheelchair propulsion/positioning, Therapeutic Activities, Patient/family education, DME/adaptive equipment  instruction, Cognitive remediation/compensation, Functional electrical stimulation, Warden/ranger, Psychosocial support, Therapeutic Exercise, UE/LE Strength taining/ROM, Skin care/wound management, Functional mobility training, Community reintegration, Discharge planning, Neuromuscular re-education, Splinting/orthotics, UE/LE Coordination activities  OT Interventions Balance/vestibular training, Discharge planning, Self Care/advanced ADL retraining, Therapeutic Activities, UE/LE Coordination activities, Disease mangement/prevention, Functional mobility training, Patient/family education, Therapeutic Exercise, Neuromuscular re-education, Splinting/orthotics, UE/LE Strength taining/ROM, Wheelchair propulsion/positioning, DME/adaptive equipment instruction, Psychosocial support, Cognitive remediation/compensation, Functional electrical stimulation  SLP Interventions    TR Interventions    SW/CM Interventions Discharge Planning, Psychosocial Support, Patient/Family Education   Barriers to Discharge MD  Medical stability and New oxygen  Nursing      PT Decreased caregiver support, Home environment access/layout 3 steps to enter home, wife uable to provide physical assist  OT      SLP      SW Medication compliance Co-pay too high for Eliquis to be able to take at home was on coumadin prior to admission   Team Discharge Planning: Destination: PT-Home ,OT- Home , SLP-Home Projected Follow-up: PT-Home health PT, OT-  24 hour supervision/assistance, SLP-  Projected Equipment Needs: PT-To be determined, OT- To be determined, SLP-  Equipment Details: PT- , OT-  Patient/family involved in discharge planning: PT- Patient, Family member/caregiver,  OT-Patient, Family member/caregiver, SLP-Patient, Family member/caregiver  MD ELOS: 14-17 days. Medical Rehab Prognosis:  Good Assessment: 83 year old male with history of COPD, DM?, prior CVA, A fib- on coumadin who was admitted on 11/14/2017  with sudden onset of left-sided weakness.  CT head neck was negative for large vessel occlusion or flow-limiting stenosis and revealed  incidental severe left C3/4  neuroforaminal narrowing.  INR subtherapeutic at admission and he received TPA.  2D echo done showing EF of 35 to 40% with apical and anteroseptal myocardial akinesis.  He had sudden onset of vomiting and CT head ordered, reviewed, unremarkable for acute intracranial process. MRI of brain revealed small acute cortical/subcortical infarct and posterior right frontal lobe and remote left parieto-occipital infarct.  Cardiology was consulted for input on DO ACS and recommended Myoview for work-up.  This revealed EF of 28% with global hypokinesis, no reversible ischemia and fixed intra-lateral wall may represent infarct.  He has had issues with A. fib with RVR therefore metoprolol added, Coreg titrated upwards and digoxin added today.  Chest x-ray ordered due to SOB and showed bronchitic changes.  Albuterol d/c to avoid SE of tachycardia.  Therapy ongoing and patient was deficits due to left-sided weakness as well as cognitive deficits.  Will set goals for Supervision/Mod I with PT/OT.    See Team Conference Notes for weekly updates to the plan of care

## 2017-11-21 NOTE — Progress Notes (Signed)
Occupational Therapy Assessment and Plan  Patient Details  Name: Chad Franklin MRN: [id number] Date of Birth: 21-Jul-1934  OT Diagnosis: ataxia, cognitive deficits, flaccid hemiplegia and hemiparesis, hemiplegia affecting non-dominant side, muscle weakness (generalized) and swelling of limb Rehab Potential: Rehab Potential (ACUTE ONLY): Good ELOS: 14-18 days    Today's Date: 11/21/2017 OT Individual Time: 1330-1500 OT Individual Time Calculation (min): 90 min     Problem List:  Patient Active Problem List   Diagnosis Date Noted  . Sleep disturbance   . Ischemic stroke of frontal lobe ([place]) 11/20/2017  . History of depression   . Essential hypertension   . Acute cerebral infarction ([place])   . Chronic atrial fibrillation ([place])   . Acute combined systolic and diastolic congestive heart failure ([place])   . Diabetes mellitus type 2 in nonobese (HCC)   . Atrial fibrillation with rapid ventricular response ([place])   . History of CVA (cerebrovascular accident)   . Hypokalemia   . Acute blood loss anemia   . Acute ischemic stroke ([place]) 11/14/2017  . Diabetes mellitus 10/26/2010  . GERD (gastroesophageal reflux disease) 10/26/2010  . COPD (chronic obstructive pulmonary disease) ([place]) 10/26/2010  . Depression 10/26/2010  . Bilateral swelling of feet 10/26/2010  . Palpitations 10/26/2010    Past Medical History:  Past Medical History:  Diagnosis Date  . Bilateral swelling of feet   . COPD (chronic obstructive pulmonary disease) ([place])   . Depression   . Diabetes mellitus   . GERD (gastroesophageal reflux disease)   . Palpitations   . Stroke [facility])    2   Past Surgical History:  Past Surgical History:  Procedure Laterality Date  . GALLBLADDER SURGERY    . HERNIA REPAIR      Assessment & Plan Clinical Impression: Patient is a 83 y.o. year old male with recent admission to the hospital on 11/14/2017 with reports of L sided weakness. CT head ordered, reviewed, unremarkable for acute  intracranial process. MRI of brain revealed small acute cortical/subcortical infarct and posterior right frontal lobe and remote left parieto-occipital infarctT .  Patient transferred to CIR on 11/20/2017 .    Patient currently requires max with basic self-care skills secondary to muscle weakness and muscle paralysis, decreased cardiorespiratoy endurance and decreased oxygen support, abnormal tone, ataxia and decreased coordination, decreased problem solving, decreased safety awareness and decreased memory and decreased sitting balance, decreased standing balance, hemiplegia and decreased balance strategies.  Prior to hospitalization, patient could complete ADL/IADLs with independent .  Patient will benefit from skilled intervention to decrease level of assist with basic self-care skills and increase independence with basic self-care skills prior to discharge home with care partner.  Anticipate patient will require 24 hour supervision and follow up home health.  OT - End of Session Activity Tolerance: Tolerates < 10 min activity, no significant change in vital signs Endurance Deficit: Yes Endurance Deficit Description: Pt with increased fatigue with exertion for self care tasks OT Assessment Rehab Potential (ACUTE ONLY): Good OT Patient demonstrates impairments in the following area(s): Balance;Safety;Cognition;Endurance;Motor;Skin Integrity OT Basic ADL's Functional Problem(s): Grooming;Bathing;Dressing;Toileting OT Transfers Functional Problem(s): Toilet;Tub/Shower OT Additional Impairment(s): Fuctional Use of Upper Extremity OT Plan OT Intensity: Minimum of 1-2 x/day, 45 to 90 minutes OT Frequency: 5 out of 7 days OT Duration/Estimated Length of Stay: 14-18 days  OT Treatment/Interventions: Balance/vestibular training;Discharge planning;Self Care/advanced ADL retraining;Therapeutic Activities;UE/LE Coordination activities;Disease mangement/prevention;Functional mobility training;Patient/family  education;Therapeutic Exercise;Neuromuscular re-education;Splinting/orthotics;UE/LE Strength taining/ROM;Wheelchair propulsion/positioning;DME/adaptive equipment instruction;Psychosocial support;Cognitive remediation/compensation;Functional electrical stimulation OT Self Feeding Anticipated  Outcome(s): mod I  OT Basic Self-Care Anticipated Outcome(s): supervision  OT Toileting Anticipated Outcome(s): supervision OT Bathroom Transfers Anticipated Outcome(s): supervision  OT Recommendation Patient destination: Home Follow Up Recommendations: 24 hour supervision/assistance Equipment Recommended: To be determined   Skilled Therapeutic Intervention Pt supine in bed upon entry with no reports of pain with wife present. Pt performed all functional transfers this session with varying mod-max A for safety with max VC's for sequencing due to decreased balance, posterior biars, impaired LUE function and decreased attention to tasks. Pt performed bathing tasks shower level with BSC and grab bars and toileting tasks in bathroom with BSC over toilet. Pt dressed seated in w/c at sink and in standing for pulling up LB dressing. Pt educated on occupational therapy services, inpatient rehab and had no questions at end of session. PT left in room with chair alarm set and wife in room.   Precautions/Restrictions  Precautions Precautions: Fall Restrictions Weight Bearing Restrictions: No General Chart Reviewed: Yes Response to Previous Treatment: Patient reporting fatigue but able to participate Family/Caregiver Present: Yes Home Living/Prior [place] expects to be discharged to:: Private residence Living Arrangements: Spouse/significant other Available Help at Discharge: Family, Available 24 hours/day Type of Home: House Home Access: Stairs to enter Technical brewer of Steps: 3 Entrance Stairs-Rails: Left Home Layout: One level Biochemist, clinical: Standard Bathroom  Accessibility: Yes  Lives With: Spouse IADL History Homemaking Responsibilities: Yes Meal Prep Responsibility: Secondary Laundry Responsibility: Secondary Cleaning Responsibility: Secondary Bill Paying/Finance Responsibility: No Shopping Responsibility: No Child Care Responsibility: No Current License: Yes Mode of Transportation: Car Education: very low, had to leave at "early age" to take care of sick parent Occupation: Retired 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 Prior Function Level of Independence: Independent with basic ADLs, Independent with transfers, Independent with homemaking with ambulation, Independent with gait  Able to Take Stairs?: Yes Driving: Yes 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 Leisure: Hobbies-yes (Comment) Comments: drives, was able to return to independence after prior stroke w/ no lasting deficits ADL ADL Grooming: Moderate assistance Where Assessed-Grooming: Standing at sink Upper Body Bathing: Moderate assistance Where Assessed-Upper Body Bathing: Shower Lower Body Bathing: Maximal assistance Where Assessed-Lower Body Bathing: Shower Upper Body Dressing: Moderate assistance Where Assessed-Upper Body Dressing: Wheelchair Lower Body Dressing: Maximal assistance Where Assessed-Lower Body Dressing: Wheelchair Toileting: Maximal assistance Where Assessed-Toileting: Glass blower/designer: Moderate assistance Toilet Transfer Method: Stand pivot Toilet Transfer Equipment: Grab bars, Raised toilet seat Social research officer, government: Moderate assistance Social research officer, government Method: Radiographer, therapeutic: Grab bars, Shower seat with back Vision Baseline Vision/History: No visual deficits Patient Visual Report: No change from baseline Vision Assessment?: Yes Eye Alignment: Within Functional Limits Visual Fields: No apparent deficits Perception  Perception: Within  Functional Limits Praxis Praxis: Intact Cognition Overall Cognitive Status: Difficult to assess(low baseline education level ) Arousal/Alertness: Awake/alert Orientation Level: Person;Situation Year: Other (Comment)(pt unable to report or guess a year) Month: (pt unable to report or guess month) Day of Week: Correct Memory: Impaired Memory Impairment: Decreased short term memory;Decreased recall of new information Decreased Short Term Memory: Functional basic;Verbal basic Immediate Memory Recall: Bed;Blue;Sock Memory Recall: Sock Attention: Selective Selective Attention: Impaired Selective Attention Impairment: Functional basic;Verbal basic Awareness: Impaired Awareness Impairment: Intellectual impairment Problem Solving: Impaired Problem Solving Impairment: Functional complex;Verbal basic Executive Function: Decision Making Reasoning: Impaired Reasoning Impairment: Functional basic Decision Making: Impaired Decision Making Impairment: Functional basic Behaviors: Impulsive Safety/Judgment: Impaired Comments: Decreased  awareness of deficits and functional limitations  Sensation Sensation Light Touch: Appears Intact Hot/Cold: Appears Intact Proprioception: Impaired Detail Proprioception Impaired Details: Impaired LUE Stereognosis: Not tested Coordination Gross Motor Movements are Fluid and Coordinated: No Fine Motor Movements are Fluid and Coordinated: No Coordination and Movement Description: L hemiplegia; UE > LE  Motor  Motor Motor: Hemiplegia;Ataxia;Abnormal tone Trunk/Postural Assessment  Cervical Assessment Cervical Assessment: Within Functional Limits Thoracic Assessment Thoracic Assessment: Within Functional Limits Lumbar Assessment Lumbar Assessment: Within Functional Limits Postural Control Postural Control: Deficits on evaluation  Balance Balance Balance Assessed: Yes Static Sitting Balance Static Sitting - Balance Support: Feet supported Static  Sitting - Level of Assistance: 5: Stand by assistance Static Sitting - Comment/# of Minutes: sitting EOB Dynamic Sitting Balance Dynamic Sitting - Balance Support: Feet supported;During functional activity Dynamic Sitting - Level of Assistance: 4: Min assist Sitting balance - Comments: sitting BSC to complete shower tasks  Static Standing Balance Static Standing - Balance Support: Right upper extremity supported Static Standing - Level of Assistance: 4: Min assist Static Standing - Comment/# of Minutes: ~2 minutes Dynamic Standing Balance Dynamic Standing - Balance Support: Right upper extremity supported;During functional activity Dynamic Standing - Level of Assistance: 3: Mod assist Extremity/Trunk Assessment RUE Assessment RUE Assessment: Within Functional Limits General Strength Comments: 4/5 LUE Assessment LUE Assessment: Exceptions to WFL Passive Range of Motion (PROM) Comments: Full PROM, no hypertonicity  General Strength Comments: Shoulder shrug, elbow flexion 2+/5, wrist flexion and extension 2+/5, weak gross grasp however unable to extend digits   See Function Navigator for Current Functional Status.   Refer to Care Plan for Long Term Goals  Recommendations for other services: None    Discharge Criteria: Patient will be discharged from OT if patient refuses treatment 3 consecutive times without medical reason, if treatment goals not met, if there is a change in medical status, if patient makes no progress towards goals or if patient is discharged from hospital.  The above assessment, treatment plan, treatment alternatives and goals were discussed and mutually agreed upon: by patient and by family    11/21/2017, 4:47 PM  

## 2017-11-21 NOTE — Evaluation (Signed)
Speech Language Pathology Assessment and Plan  Patient Details  Name: Chad Franklin MRN: [id number] Date of Birth: April 22, 1934  Evaluation only  Today's Date: 11/21/2017 SLP Individual Time: 5364-6803 SLP Individual Time Calculation (min): 60 min   Problem List:  Patient Active Problem List   Diagnosis Date Noted  . Sleep disturbance   . Ischemic stroke of frontal lobe ([place]) 11/20/2017  . History of depression   . Essential hypertension   . Acute cerebral infarction ([place])   . Chronic atrial fibrillation ([place])   . Acute combined systolic and diastolic congestive heart failure ([place])   . Diabetes mellitus type 2 in nonobese (HCC)   . Atrial fibrillation with rapid ventricular response ([place])   . History of CVA (cerebrovascular accident)   . Hypokalemia   . Acute blood loss anemia   . Acute ischemic stroke ([place]) 11/14/2017  . Diabetes mellitus 10/26/2010  . GERD (gastroesophageal reflux disease) 10/26/2010  . COPD (chronic obstructive pulmonary disease) ([place]) 10/26/2010  . Depression 10/26/2010  . Bilateral swelling of feet 10/26/2010  . Palpitations 10/26/2010   Past Medical History:  Past Medical History:  Diagnosis Date  . Bilateral swelling of feet   . COPD (chronic obstructive pulmonary disease) ([place])   . Depression   . Diabetes mellitus   . GERD (gastroesophageal reflux disease)   . Palpitations   . Stroke [facility])    2   Past Surgical History:  Past Surgical History:  Procedure Laterality Date  . GALLBLADDER SURGERY    . HERNIA REPAIR      Assessment / Plan / Recommendation Clinical Impression Chad Franklin is an 83 year old male with history of COPD, DM?, prior CVA(2012), A fib- on coumadin who was admitted on 11/14/2017 with sudden onset of left-sided weakness. History taken from chart review and wife. CT head neck was negative for large vessel occlusion or flow-limiting stenosis and revealed incidental severe left C3/4 neuroforaminal narrowing. INR  subtherapeutic at admission and he received TPA. 2D echo done showing EF of 35 to 40% with apical and anteroseptal myocardial akinesis. MRI of brain revealed small acute cortical/subcortical infarct and posterior right frontal lobe and remote left parieto-occipital infarct. Cardiology was consulted for input on DO ACS and recommended Myoview for work-up. This revealed EF of 28% with global hypokinesis, no reversible ischemia and fixed intra-lateral wall may represent infarct. He has had issues with A. fib with RVR therefore metoprolol added, Coreg titrated upwards and digoxin added today. Chest x-ray ordered due to SOB and showed bronchitic changes. Albuterol d/c to avoid SE of tachycardia. Therapy ongoing and patient was deficits due to left-sided weakness as well as cognitive deficits. CIR was recommended for progressive therapies with admission on 11/20/17.   Cognitive linguistic evaluation provided on 11/21/17 with wife present. More detailed baseline informaiton gathered from pt and wife. At baseline pt had very little education (left school as a "young child") and is unable to read or write. Wife doesn't report any cognitive deficits that are exacerbated by acute CVA or hospitalization. At baseline, pt drove, took care of yard and was functional within his lifestyle. Currently pt is able to demonstrate basic selective attention, comprehend basic information, express basic information, solve basic tasks, demonstrate intellectual awareness and verbally problem solve hypothetical anticipatory awareness situations. At this time, pt's ability level appears to be at baseline. SLP consulted with interdisciplinary team and skilled ST doesn't appear indicated at this time. All questions answered to satisfaction.     Skilled Therapeutic  Interventions          Skilled treatment session focused on completion of cognitive linguistic evaluation and differentiation of any acute cognitive impairments vs. Baseline  cognitive-educational abilities. Pt was able to perform basic money task that are functional to him (I.e., going to store to buy gas with specified amount of money) as well as basic math problems related to landscaping (50 lb bag of fertiziler per 176f would need 3 bags for 300 ft), selecitve attention, intellectual awareness/ verbally demonstrate anticipatory awareness.     SLP Assessment  Patient does not need any further Speech [facility]    Recommendations  Patient destination: Home    SLP Frequency     SLP Duration  SLP Intensity  SLP Treatment/Interventions            Pain Pain Assessment Pain Scale: 0-10 Pain Score: 0-No pain  Prior Functioning Cognitive/Linguistic Baseline: (d/t educational level) Baseline deficit details: low educational level, unable to read or write Type of Home: House  Lives With: Spouse Available Help at Discharge: Family;Available 24 hours/day Education: very low, had to leave at "early age" to take care of sick parent Vocation: Retired  Function:  Eating Eating                 Cognition Comprehension Comprehension assist level: Follows basic conversation/direction with no assist  Expression   Expression assist level: Expresses basic needs/ideas: With no assist  Social Interaction Social Interaction assist level: Interacts appropriately 90% of the time - Needs monitoring or encouragement for participation or interaction.  Problem Solving Problem solving assist level: Solves basic 90% of the time/requires cueing < 10% of the time  Memory Memory assist level: Recognizes or recalls 90% of the time/requires cueing < 10% of the time   Short Term Goals: No short term goals set  Refer to Care Plan for Long Term Goals  Recommendations for other services: None   Discharge Criteria: Patient will be discharged from SLP if patient refuses treatment 3 consecutive times without medical reason, if treatment goals not met,  if there is a change in medical status, if patient makes no progress towards goals or if patient is discharged from hospital.  The above assessment, treatment plan, treatment alternatives and goals were discussed and mutually agreed upon: by patient and by family  Takeela Peil 11/21/2017, 11:31 AM

## 2017-11-21 NOTE — Evaluation (Signed)
Physical Therapy Assessment and Plan  Patient Details  Name: Chad Franklin MRN: [id number] Date of Birth: 06/20/1934  PT Diagnosis: Abnormality of gait, Cognitive deficits, Coordination disorder, Difficulty walking, Hemiplegia non-dominant, Impaired cognition and Muscle weakness Rehab Potential: Good ELOS: 18-21 days   Today's Date: 11/21/2017 PT Individual Time: 0800-0850 PT Individual Time Calculation (min): 50 min    Problem List:  Patient Active Problem List   Diagnosis Date Noted  . Ischemic stroke of frontal lobe ([place]) 11/20/2017  . History of depression   . Essential hypertension   . Acute cerebral infarction ([place])   . Chronic atrial fibrillation ([place])   . Acute combined systolic and diastolic congestive heart failure ([place])   . Diabetes mellitus type 2 in nonobese (HCC)   . Atrial fibrillation with rapid ventricular response ([place])   . History of CVA (cerebrovascular accident)   . Hypokalemia   . Acute blood loss anemia   . Acute ischemic stroke ([place]) 11/14/2017  . Diabetes mellitus 10/26/2010  . GERD (gastroesophageal reflux disease) 10/26/2010  . COPD (chronic obstructive pulmonary disease) ([place]) 10/26/2010  . Depression 10/26/2010  . Bilateral swelling of feet 10/26/2010  . Palpitations 10/26/2010    Past Medical History:  Past Medical History:  Diagnosis Date  . Bilateral swelling of feet   . COPD (chronic obstructive pulmonary disease) ([place])   . Depression   . Diabetes mellitus   . GERD (gastroesophageal reflux disease)   . Palpitations   . Stroke [facility])    2   Past Surgical History:  Past Surgical History:  Procedure Laterality Date  . GALLBLADDER SURGERY    . HERNIA REPAIR      Assessment & Plan Clinical Impression: Patient is a 83 year old male with history of COPD, DM?, prior CVA, A fib- on coumadin who was admitted on 11/14/2017 with sudden onset of left-sided weakness.  History taken from chart review and wife. CT head neck was negative for large  vessel occlusion or flow-limiting stenosis and revealed incidental severe left C3/4  neuroforaminal narrowing.  INR subtherapeutic at admission and he received TPA.  2D echo done showing EF of 35 to 40% with apical and anteroseptal myocardial akinesis.  He had sudden onset of vomiting and CT head ordered, reviewed, unremarkable for acute intracranial process. MRI of brain revealed small acute cortical/subcortical infarct and posterior right frontal lobe and remote left parieto-occipital infarct.  Cardiology was consulted for input on DO ACS and recommended Myoview for work-up.  This revealed EF of 28% with global hypokinesis, no reversible ischemia and fixed intra-lateral wall may represent infarct.  He has had issues with A. fib with RVR therefore metoprolol added, Coreg titrated upwards and digoxin added today.  Chest x-ray ordered due to SOB and showed bronchitic changes.  Albuterol d/c to avoid SE of tachycardia.  Therapy ongoing and patient was deficits due to left-sided weakness as well as cognitive deficits.  CIR was recommended for progressive therapies.  Patient transferred to CIR on 11/20/2017 .   Patient currently requires mod with mobility secondary to muscle weakness, decreased cardiorespiratoy endurance and decreased oxygen support, unbalanced muscle activation, decreased coordination and decreased motor planning, decreased attention, decreased problem solving, decreased safety awareness and decreased memory and decreased sitting balance, decreased standing balance, decreased postural control, hemiplegia and decreased balance strategies.  Prior to hospitalization, patient was independent  with mobility and lived with Spouse in a House home.  Home access is 3Stairs to enter.  Patient will benefit from skilled  PT intervention to maximize safe functional mobility, minimize fall risk and decrease caregiver burden for planned discharge home with 24 hour supervision.  Anticipate patient will benefit from  follow up [place] at discharge.  PT - End of Session Activity Tolerance: Tolerates < 10 min activity, no significant change in vital signs Endurance Deficit: Yes Endurance Deficit Description: increased work of breathing w/ all functional mobility, suspect 2/2 COPD PT Assessment Rehab Potential (ACUTE/IP ONLY): Good PT Barriers to Discharge: Decreased caregiver support;Home environment access/layout PT Barriers to Discharge Comments: 3 steps to enter home, wife uable to provide physical assist PT Patient demonstrates impairments in the following area(s): Balance;Endurance;Motor;Safety PT Transfers Functional Problem(s): Bed Mobility;Bed to Chair;Car;Furniture;Floor PT Locomotion Functional Problem(s): Stairs;Wheelchair Mobility;Ambulation PT Plan PT Intensity: Minimum of 1-2 x/day ,45 to 90 minutes PT Frequency: 5 out of 7 days PT Duration Estimated Length of Stay: 18-21 days PT Treatment/Interventions: Disease management/prevention;Ambulation/gait training;Pain management;Stair training;Visual/perceptual remediation/compensation;Wheelchair propulsion/positioning;Therapeutic Activities;Patient/family education;DME/adaptive equipment instruction;Cognitive remediation/compensation;Functional electrical stimulation;Balance/vestibular training;Psychosocial support;Therapeutic Exercise;UE/LE Strength taining/ROM;Skin care/wound management;Functional mobility training;Community reintegration;Discharge planning;Neuromuscular re-education;Splinting/orthotics;UE/LE Coordination activities PT Transfers Anticipated Outcome(s): supervision PT Locomotion Anticipated Outcome(s): min assist gait  PT Recommendation Follow Up Recommendations: Home health PT Patient destination: Home Equipment Recommended: To be determined  Skilled Therapeutic Intervention  Pt in supine and agreeable to therapy, denies pain. Pt and wife instructed patient in PT Evaluation and initiated treatment intervention; see below for  results. Increased time for all mobility 2/2 increased work of breathing and coughing, pt on 2L O2 throughout session w/ no signs or symptoms of desaturation. Pt reports this coughing is his baseline. Pt and wife educated patient in [place], rehab potential, rehab goals, and discharge recommendations. Returned to room and ended session in w/c and in care of family, all needs met. Educated wife on providing supervision while pt is up in w/c, wife verbalized understanding that pt needs staff to transfer and to let RN know if she leaves to place quick release belt.   PT Evaluation Precautions/Restrictions Precautions Precautions: Fall Restrictions Weight Bearing Restrictions: No General   Vital SignsTherapy Vitals Temp: 98.3 F (36.8 C) Temp Source: Oral Pulse Rate: (!) 132 BP: (!) 115/93 Patient Position (if appropriate): Lying Oxygen Therapy SpO2: 96 % O2 Device: Room Air Pain Pain Assessment Pain Scale: 0-10 Pain Score: 0-No pain Home Living/Prior Functioning Home Living Available Help at Discharge: Family;Available 24 hours/day Type of Home: House Home Access: Stairs to enter [employer] of Steps: 3 Entrance Stairs-Rails: Left Home Layout: One level Bathroom Toilet: Standard Bathroom Accessibility: Yes  Lives With: Spouse Prior Function Level of Independence: Independent with basic ADLs;Independent with transfers;Independent with homemaking with ambulation;Independent with gait  Able to Take Stairs?: Yes Driving: Yes 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 Comments: drives, was able to return to independence after prior stroke w/ no lasting deficits Vision/Perception  Geologist, engineering: Within Advertising copywriter Praxis Praxis: Intact  Cognition Overall Cognitive Status: Within Functional Limits for tasks assessed Arousal/Alertness: Awake/alert Orientation Level: Oriented X4 Memory: Impaired Awareness:  Impaired Problem Solving: Impaired Reasoning: Impaired Safety/Judgment: Appears intact Sensation Sensation Light Touch: Appears Intact Coordination Gross Motor Movements are Fluid and Coordinated: No Fine Motor Movements are Fluid and Coordinated: No Motor  Motor Motor: Hemiplegia Motor - Skilled Clinical Observations: L hemi, UE>LE  Mobility Bed Mobility Bed Mobility: Rolling Right;Rolling Left;Supine to Sit;Sit to Supine Rolling Right: Supervision/verbal cueing Rolling Left: Supervision/Verbal cueing Supine to Sit: Moderate Assistance - Patient 50-74% Sit to Supine: Moderate  Assistance - Patient 50-74% Transfers Transfers: Sit to Stand;Stand to Sit;Stand Pivot Transfers Sit to Stand: Minimal Assistance - Patient > 75% Stand to Sit: Minimal Assistance - Patient > 75% Stand Pivot Transfers: Moderate Assistance - Patient 50 - 74% Stand Pivot Transfer Details: Manual facilitation for weight bearing;Manual facilitation for weight shifting;Manual facilitation for placement;Tactile cues for initiation;Verbal cues for technique;Verbal cues for precautions/safety Transfer (Assistive device): 1 person hand held assist Locomotion  Gait Ambulation: Yes Gait Assistance: Moderate Assistance - Patient 50-74% Gait Distance (Feet): 30 Feet Assistive device: 1 person hand held assist Gait Assistance Details: Verbal cues for sequencing;Verbal cues for technique;Verbal cues for precautions/safety;Manual facilitation for placement;Manual facilitation for weight shifting;Verbal cues for gait pattern Gait Gait: Yes Gait Pattern: Impaired Gait Pattern: Trunk flexed;Shuffle;Wide base of support;Right flexed knee in stance;Left flexed knee in stance(both knees flexed throughout gait cycle) Gait velocity: decreased Stairs / Additional Locomotion Stairs: No Wheelchair Mobility Wheelchair Mobility: Yes Wheelchair Assistance: Chartered loss adjuster: Right upper  extremity;Right lower extremity Wheelchair Parts Management: Needs assistance Distance: 150'  Trunk/Postural Assessment  Cervical Assessment Cervical Assessment: Within Functional Limits Thoracic Assessment Thoracic Assessment: Within Functional Limits Lumbar Assessment Lumbar Assessment: Within Functional Limits Postural Control Postural Control: Deficits on evaluation(delayed/absent, requires UE support to maintain)  Balance Balance Balance Assessed: Yes Static Sitting Balance Static Sitting - Balance Support: Right upper extremity supported;Feet supported Static Sitting - Level of Assistance: 5: Stand by assistance Dynamic Sitting Balance Dynamic Sitting - Balance Support: Right upper extremity supported;Feet supported Dynamic Sitting - Level of Assistance: 4: Min assist Static Standing Balance Static Standing - Balance Support: During functional activity;No upper extremity supported Static Standing - Level of Assistance: 4: Min assist Extremity Assessment      RLE Assessment RLE Assessment: Within Functional Limits LLE Assessment LLE Assessment: Exceptions to [facility] Passive Range of Motion (PROM) Comments: [facility] General Strength Comments: 4/5 globally   See Function Navigator for Current Functional Status.   Refer to Care Plan for Long Term Goals  Recommendations for other services: None   Discharge Criteria: Patient will be discharged from PT if patient refuses treatment 3 consecutive times without medical reason, if treatment goals not met, if there is a change in medical status, if patient makes no progress towards goals or if patient is discharged from hospital.  The above assessment, treatment plan, treatment alternatives and goals were discussed and mutually agreed upon: by patient and by family  Lakin Rhine [name] 11/21/2017, 9:01 AM

## 2017-11-21 NOTE — Progress Notes (Signed)
Social Work  Social Work Assessment and Plan  Patient Details  Name: XAYDEN LINSEY MRN: [id number] Date of Birth: May 13, 1934  Today's Date: 11/21/2017  Problem List:  Patient Active Problem List   Diagnosis Date Noted  . Sleep disturbance   . Ischemic stroke of frontal lobe (HCC) 11/20/2017  . History of depression   . Essential hypertension   . Acute cerebral infarction (H[facility])   . Chronic atrial fibrillation (HCC)   . Acute combined systolic and diastolic congestive heart failure (H[facility])   . Diabetes mellitus type 2 in nonobese (HCC)   . Atrial fibrillation with rapid ventricular response (H[facility])   . History of CVA (cerebrovascular accident)   . Hypokalemia   . Acute blood loss anemia   . Acute ischemic stroke (H[facility]) 11/14/2017  . Diabetes mellitus 10/26/2010  . GERD (gastroesophageal reflux disease) 10/26/2010  . COPD (chronic obstructive pulmonary disease) (HCC) 10/26/2010  . Depression 10/26/2010  . Bilateral swelling of feet 10/26/2010  . Palpitations 10/26/2010   Past Medical History:  Past Medical History:  Diagnosis Date  . Bilateral swelling of feet   . COPD (chronic obstructive pulmonary disease) (H[facility])   . Depression   . Diabetes mellitus   . GERD (gastroesophageal reflux disease)   . Palpitations   . Stroke [facility])    2   Past Surgical History:  Past Surgical History:  Procedure Laterality Date  . GALLBLADDER SURGERY    . HERNIA REPAIR     Social History:  reports that he has quit smoking. He does not have any smokeless tobacco history on file. He reports that he does not drink alcohol. His drug history is not on file.  Family / Support Systems Marital Status: Married Patient Roles: Spouse, Parent, Other (Comment)(employee) Spouse/Significant Other: Wilma [phone]-home Children: Three son's and three daughter's Other Supports: Friends and church members Anticipated Caregiver: Wife and children Ability/Limitations of Caregiver: None Caregiver Availability:  24/7 Family Dynamics: Close knit family all of their children are invovled and supportive. They have friends and church members who are supportive and will visit him when he gets home. Pt is one who is very independent and doesn't want to burden others.  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 Wife is staying here with him so will look toward her if any decisions need to be made while here. Pt seems to be improving and Speech has signed off.   Abuse/Neglect Abuse/Neglect Assessment Can Be Completed: Yes Physical Abuse: Denies Verbal Abuse: Denies Sexual Abuse: Denies Exploitation of patient/patient's resources: Denies Self-Neglect: Denies  Emotional Status Pt's affect, behavior adn adjustment status: Pt is motivated to improve and recover from this stroke. He is making progress and feels good about how he is doing so far. He has always been independent and plans to remain this way. His wife nods her head when he talks about not asking otheres for help.  Recent Psychosocial Issues: other health issues was being compliant with his coumadin but cost are an issue. Pyschiatric History: History of depression which was a long time ago, he feels he is doing ok and can see  he will do well here. He may benefit from seeing neuro-psych while here for supportive, since this may cause him to become depressed again. Will make referral for him to be seen next week. Substance Abuse History: No issues-quit tobacco doesn't drink  Patient / Family Perceptions, Expectations &  Goals Pt/Family understanding of illness & functional limitations: Pt and wife can explain his stroke and deficits. They were not aware of his previous strokes and did not know he had any. Both of them do talk with the MD's when they are rounding and feel they have a good understanding of his condition and treatment plan going forward. Premorbid pt/family roles/activities: Husband, father, grnadfather, church member, friend, etc Anticipated changes in roles/activities/participation: resume Pt/family expectations/goals: Pt states: " I want to be able to do for myself I alwayas have and will again."  Wife states: " He is stubborn and wants to do for himself, he will anyway."  [employer]: None Premorbid Home Care/DME Agencies: None Transportation available at discharge: Family members Resource referrals recommended: Neuropsychology, Support group (specify)  Discharge Planning Living Arrangements: Spouse/significant other Support Systems: Spouse/significant other, Children, Other relatives, Manufacturing engineer, Psychologist, clinical community Type of Residence: Private residence Community education officer Resources: Media planner (specify)([employer] Medicare) Financial Resources: Tree surgeon, Employment Financial Screen Referred: Previously completed Living Expenses: Own Money Management: Patient, Spouse Does the patient have any problems obtaining your medications?: Yes (Describe)(co-pays too high for Eliquis $45.00 per month) Home Management: Wife does the home management pt does the outside work Patient/Family Preliminary Plans: Return home with wife who can assist, but pt doesn't want her too. 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 Will look into assist for Eliquis so pt can continue to take this once home. Sw Barriers to Discharge: Medication compliance Sw Barriers to Discharge Comments: Co-pay too high for Eliquis to be  able to take at home was on coumadin prior to admission Social Work Anticipated Follow Up Needs: HH/OP, Support Group  Clinical Impression Pleasant gentleman who is motivated to return to his independent level. He has always been and feels will be again. Wife confirms this and feel she will not want someone or even her to assist him at home. Will look into resources for Eliquis assistance so pt can continue taking this once discharged. Do feel he would benefit from seeing neuro-psych while here and will make referral. Speech has DC him so will focus on PT and OT goals. Will work on discharge needs.  [name] Chris 11/21/2017, 12:00 PM

## 2017-11-22 ENCOUNTER — Inpatient Hospital Stay (HOSPITAL_COMMUNITY): Payer: Medicare HMO | Admitting: Physical Therapy

## 2017-11-22 ENCOUNTER — Inpatient Hospital Stay (HOSPITAL_COMMUNITY): Payer: Medicare HMO

## 2017-11-22 ENCOUNTER — Inpatient Hospital Stay (HOSPITAL_COMMUNITY): Payer: Medicare HMO | Admitting: Speech Pathology

## 2017-11-22 LAB — GLUCOSE, CAPILLARY
GLUCOSE-CAPILLARY: 120 mg/dL — AB (ref 70–99)
GLUCOSE-CAPILLARY: 132 mg/dL — AB (ref 70–99)
Glucose-Capillary: 113 mg/dL — ABNORMAL HIGH (ref 70–99)
Glucose-Capillary: 135 mg/dL — ABNORMAL HIGH (ref 70–99)

## 2017-11-22 MED ORDER — CARVEDILOL 6.25 MG PO TABS
18.7500 mg | ORAL_TABLET | Freq: Two times a day (BID) | ORAL | Status: DC
  Administered 2017-11-22 – 2017-12-04 (×24): 18.75 mg via ORAL
  Filled 2017-11-22 (×25): qty 1

## 2017-11-22 NOTE — Progress Notes (Signed)
Occupational Therapy Session Note  Patient Details  Name: Chad Franklin MRN: [id number] Date of Birth: [birth year]  Today's Date: 11/22/2017 OT Individual Time: 3646-8032 OT Individual Time Calculation (min): 55 min    Short Term Goals: Week 1:  OT Short Term Goal 1 (Week 1): Pt will perform LB dressing with mod A  OT Short Term Goal 2 (Week 1): Pt will perform sit to stands with min A using LRAD OT Short Term Goal 3 (Week 1): Pt will perform 3/3 toileting tasks with mod A  OT Short Term Goal 4 (Week 1): Pt will perform 2 grooming tasks standing at sink with min A   Skilled Therapeutic Interventions/Progress Updates:    1:1. Pt with no c/o pain. Pt declines bathing. Pt grooms at sink with A to shave d/t safety concerns. Pt requires HOH A for use of LUE integration in ADL tasks such as applying shaving cream, washing underarms and applying lotion. Pt dons shirt with A to pull down back and step by step cuing for hemi techniques.Pt dons pants with A for orientation and VC for noticing threading BLE into same pant leg.Pt complete transfers to/from w/c with steadying A and VC for L foot positioning prior to transfer. PT educated on TTB and tub adaptations for improved safety such as non slip treds, hand held shower head and curtain changes. Exited session with pt seated in bed, exit alarm on and call light in reach  Therapy Documentation Precautions:  Precautions Precautions: Fall Restrictions Weight Bearing Restrictions: No General:   Vital Signs: Therapy Vitals Pulse Rate: (!) 114 BP: 118/71 Patient Position (if appropriate): Sitting Oxygen Therapy SpO2: 96 % O2 Device: Room Air Pain: Pain Assessment Pain Scale: 0-10 Pain Score: 0-No pain ADL: ADL Grooming: Moderate assistance Where Assessed-Grooming: Standing at sink Upper Body Bathing: Moderate assistance Where Assessed-Upper Body Bathing: Shower Lower Body Bathing: Maximal assistance Where Assessed-Lower Body Bathing:  Shower Upper Body Dressing: Moderate assistance Where Assessed-Upper Body Dressing: Wheelchair Lower Body Dressing: Maximal assistance Where Assessed-Lower Body Dressing: Wheelchair Toileting: Maximal assistance Where Assessed-Toileting: Teacher, adult education: Moderate assistance Toilet Transfer Method: Stand pivot Toilet Transfer Equipment: Grab bars, Raised toilet seat Film/video editor: Moderate assistance Film/video editor Method: Warden/ranger: Grab bars, Shower seat with back Vision    See Corporate treasurer for Current Functional Status.   Therapy/Group: Individual Therapy  Shon Hale 11/22/2017, 10:33 AM

## 2017-11-22 NOTE — Progress Notes (Signed)
Progress Note  Patient Name: Chad Franklin Date of Encounter: 11/22/2017  Primary Cardiologist:   No primary care provider on file.   Subjective   He gets winded when he is ambulating but no distress  Inpatient Medications    Scheduled Meds: . apixaban  5 mg Oral BID  . carvedilol  12.5 mg Oral BID WC  . digoxin  0.125 mg Oral Daily  . furosemide  40 mg Oral Daily  . insulin aspart  0-15 Units Subcutaneous TID WC  . insulin aspart  0-5 Units Subcutaneous QHS  . losartan  25 mg Oral Daily  . Melatonin  1.5 mg Oral QHS  . multivitamin with minerals  1 tablet Oral Daily  . multivitamin with minerals  1 tablet Oral Daily  . PARoxetine  20 mg Oral Daily  . potassium chloride  20 mEq Oral Daily  . pravastatin  10 mg Oral Daily   Continuous Infusions:  PRN Meds: acetaminophen, alum & mag hydroxide-simeth, bisacodyl, diphenhydrAMINE, guaiFENesin-dextromethorphan, ipratropium, levalbuterol, polyethylene glycol, prochlorperazine **OR** prochlorperazine **OR** prochlorperazine, senna-docusate, sodium phosphate   Vital Signs    Vitals:   11/21/17 2127 11/22/17 0629 11/22/17 0823 11/22/17 0900  BP:  (!) 144/96 117/84 118/71  Pulse:  (!) 36 (!) 118 (!) 114  Resp:  18    Temp:  98.2 F (36.8 C)    TempSrc:  Oral    SpO2: 92% 95% 96%   Weight:      Height:        Intake/Output Summary (Last 24 hours) at 11/22/2017 0916 Last data filed at 11/22/2017 0820 Gross per 24 hour  Intake 744 ml  Output 700 ml  Net 44 ml   Filed Weights   11/20/17 1848  Weight: 104.5 kg    Telemetry    NA.   - Personally Reviewed  ECG    NA - Personally Reviewed  Physical Exam   GEN: No  acute distress.   Neck: No  JVD Cardiac: Irregular RR, no murmurs, rubs, or gallops.  Respiratory: Clear   to auscultation bilaterally. GI: Soft, nontender, non-distended, normal bowel sounds  MS:  No  edema; No deformity. Neuro:   Nonfocal  Psych: Oriented and appropriate    Labs     Chemistry Recent Labs  Lab 11/19/17 0452 11/20/17 0508 11/21/17 0548  NA 139 139 137  K 3.6 3.7 3.8  CL 101 102 102  CO2 30 27 26   GLUCOSE 120* 124* 106*  BUN 23 19 20   CREATININE 0.91 0.83 0.88  CALCIUM 8.3* 8.3* 8.0*  PROT  --   --  6.5  ALBUMIN  --   --  2.7*  AST  --   --  35  ALT  --   --  29  ALKPHOS  --   --  64  BILITOT  --   --  1.5*  GFRNONAA >60 >60 >60  GFRAA >60 >60 >60  ANIONGAP 8 10 9      Hematology Recent Labs  Lab 11/17/17 0315 11/18/17 0250 11/21/17 0548  WBC 9.0 8.4 9.5  RBC 4.36 4.09* 4.27  HGB 12.6* 12.0* 12.5*  HCT 41.7 38.9* 39.6  MCV 95.6 95.1 92.7  MCH 28.9 29.3 29.3  MCHC 30.2 30.8 31.6  RDW 14.1 13.9 13.7  PLT 154 144* 177    Cardiac Enzymes Recent Labs  Lab 11/15/17 1329 11/15/17 1854 11/16/17 0052  TROPONINI <0.03 <0.03 <0.03    No results for input(s): TROPIPOC in the  last 168 hours.   BNPNo results for input(s): BNP, PROBNP in the last 168 hours.   DDimer No results for input(s): DDIMER in the last 168 hours.   Radiology    No results found.  Cardiac Studies   Echo 11/15/17 Study Conclusions  - Left ventricle: The cavity size was mildly dilated. There was mild focal basal hypertrophy of the septum. Systolic function was moderately reduced. The estimated ejection fraction was in the range of 35% to 40%. - Regional wall motion abnormality: Akinesis of the mid anterior and basal-mid anteroseptal myocardium; hypokinesis of the apical anterior, mid inferoseptal, apical inferior, apical septal, apical lateral, and apical myocardium. - Aortic valve: There was trivial regurgitation. - Mitral valve: Calcified annulus. Mildly thickened leaflets . There was moderate regurgitation. - Left atrium: The atrium was massively dilated. - Right ventricle: The cavity size was moderately dilated. Wall thickness was normal. Systolic function was mildly reduced. - Right atrium: The atrium was moderately  dilated. - Atrial septum: No defect or patent foramen ovale was identified. - Tricuspid valve: There was mild regurgitation. - Pulmonic valve: There was mild regurgitation. - Pulmonary arteries: Systolic pressure was moderately increased. PA peak pressure: 44 mm Hg (S).  Impressions:  - Reduced LVEF with wall motion abnormalities across the anteroseptal, anterior, inferoseptal, and apical myocardium. Consider multivessel CAD vs. takotsubo. Massively dilated LA with moderate MR. Elevated PASP of 44 mmHg.   Patient Profile     83 y.o. male with PMH of DM2, COPD, permanent atrial fibrillation on chronic Coumadin and prior CVA 2012/2013 who is being followed by cardiology for atrial fibrillation with newly decreased LV function and the need for DOAC therapy  Assessment & Plan    ATRIAL FIB:  Rate is still elevated.  I will increase the Coreg.  He is not on tele and there is one report of a HR of 39.  He has had no symptoms.    ACUTE SYSTOLIC HF:  Titrate beta blocker then I will consider adding ACE.    RIGHT MCA STROKE:  Participating in rehab.    For questions or updates, please contact [facility] Please consult [email or web address] for contact info under Cardiology/STEMI.   Signed, Rollene Rotunda, MD  11/22/2017, 9:16 AM

## 2017-11-22 NOTE — Progress Notes (Signed)
Physical Therapy Session Note  Patient Details  Name: Chad Franklin MRN: [id number] Date of Birth: Aug 07, 1934  Today's Date: 11/22/2017 PT Individual Time: 0800-0905 and 1450-1535  PT Individual Time Calculation (min): 65 min and 45 min (total 110 min)  Short Term Goals: Week 1:  PT Short Term Goal 1 (Week 1): Pt will maintain dynamic sitting balance w/ supervision PT Short Term Goal 2 (Week 1): Pt will initiate stair training PT Short Term Goal 3 (Week 1): Pt will ambulate 73' w/ mod assist using LRAD PT Short Term Goal 4 (Week 1): Pt will perform bed<>chair transfers w/ min assist  Skilled Therapeutic Interventions/Progress Updates: Tx 1: Pt received in bed, denies pain and agreeable to treatment. Supine>sit from flat bed with S, bedrails and increased time with cues for attention to LUE placement for safety. Stand pivot minA to w/c, no AD. Transported to gym totalA. Vitals assessed as below. Upon standing to initiate gait training, pt reports bowels moving. Returned to room totalA. Stand pivot minA w/c <>toilet with grab bars, totalA for clothing management and hygiene. Gait with R hall rail x25' forward and backward x2 trials; cues for increased L step length walking backwards to facilitate glute activation, hip flexor/gastroc/soleus lengthening. Gait with RW and dycem L hand grip (L hand splint not available) x40' with minA, min cues for increased L step length and safety with complete turn prior to sitting on mat table. Standing RLE toe taps to 3" step with BUE support on RW, 2 sets 10-15 taps each, repetitive verbal/tactile cues/facilitation for L glute activation/hip and trunk extension in stance. Stand pivot to R side into w/c with RW and minA; cues for R hand placement sit <>stand. Stand pivot to L side into recliner with RW, minA. Remained in recliner, RN and wife present, all needs in reach at completion of session.   Tx 2: Pt received in recliner, denies pain and agreeable to treatment.  Transported to gym totalA for energy conservation. Gait x60' with RW and minA, cues for LLE foot placement and step length. O2 assessed following trial, on room air with SOB, 95%. Standing no UE support dynamic reaching to retrieve and match playing cards to board with cards placed at various heights to facilitate reaching and trunk rotation. Returned to room urgently d/t need to void. Ambulatory transfer in/out of bathroom modA with RW d/t difficulty managing RW in tight spaces. MinA for standing balance while therapist performed hygiene, pt able to assist pulling pants up with RUE. Side stepping R/L 10' each direction x3 reps with assist for maintaining LUE on rail, cues for upright posture. Gait x75' RW and minA, tactile facilitation at L glutes during stance. Sit >supine with S, HOB flat. Remained in bed, alarm intact, family present and all needs in reach.      Therapy Documentation Precautions:  Precautions Precautions: Fall Restrictions Weight Bearing Restrictions: No Vital Signs: Therapy Vitals Temp: 98.2 F (36.8 C) Temp Source: Oral Pulse Rate: (!) 118 Resp: 18 BP: 117/84 Patient Position (if appropriate): Sitting Oxygen Therapy SpO2: 96 % O2 Device: Room Air Pain: Pain Assessment Pain Scale: 0-10 Pain Score: 0-No pain   See Function Navigator for Current Functional Status.   Therapy/Group: Individual Therapy  Harlon Ditty 11/22/2017, 9:08 AM

## 2017-11-22 NOTE — Progress Notes (Signed)
Physical Therapy Session Note  Patient Details  Name: Chad Franklin MRN: [id number] Date of Birth: 1934-12-05  Today's Date: 11/22/2017 PT Individual Time: 1702-1730 PT Individual Time Calculation (min): 28 min   Short Term Goals: Week 1:  PT Short Term Goal 1 (Week 1): Pt will maintain dynamic sitting balance w/ supervision PT Short Term Goal 2 (Week 1): Pt will initiate stair training PT Short Term Goal 3 (Week 1): Pt will ambulate 17' w/ mod assist using LRAD PT Short Term Goal 4 (Week 1): Pt will perform bed<>chair transfers w/ min assist  Skilled Therapeutic Interventions/Progress Updates:   Pt received supine in bed and agreeable to PT. Supine>sit transfer with min-mod assist and cues for safety awareness for positioning of the LUE. Stand pivot transfer to and from [facility] x 3 throughout treatment with min assist with RW.    Gait training with RW xs 108f with min-mod assist to control RW and stabilize the LUE with dycem on  Hand grip. Mod assist in turn to prevent L lateral fall.   WC mobility x 1567fwith Hemi technique and min cues for energy conservation. Pt returned to room and performed stand pivot transfer to bed with min assist as listed above Sit>supine completed with min assist to manage the LLE. Pt left supine in bed with call bell in reach and all needs met.       Therapy Documentation Precautions:  Precautions Precautions: Fall Restrictions Weight Bearing Restrictions: No    Vital Signs: Therapy Vitals Temp: 97.8 F (36.6 C) Temp Source: Oral Pulse Rate: (!) 119 Resp: 19 BP: 118/83 Patient Position (if appropriate): Lying Oxygen Therapy SpO2: 90 % O2 Device: Room Air Pain:   denies  See Function Navigator for Current Functional Status.   Therapy/Group: Individual Therapy  AuLorie Phenix/08/2017, 5:39 PM

## 2017-11-22 NOTE — Progress Notes (Signed)
Physical Therapy Session Note  Patient Details  Name: HAIDYN CHADDERDON MRN: [id number] Date of Birth: [birth year]  Today's Date: 11/22/2017 PT Individual Time: 1330-1400 PT Individual Time Calculation (min): 30 min   Short Term Goals: Week 1:  PT Short Term Goal 1 (Week 1): Pt will maintain dynamic sitting balance w/ supervision PT Short Term Goal 2 (Week 1): Pt will initiate stair training PT Short Term Goal 3 (Week 1): Pt will ambulate 29' w/ mod assist using LRAD PT Short Term Goal 4 (Week 1): Pt will perform bed<>chair transfers w/ min assist  Skilled Therapeutic Interventions/Progress Updates: Pt presented in bed sleeping but easilly awakened and agreeable to therapy. Pt performed supine to sit to L with modA and use of bed features. Performed stand pivot transfer to w/c modA. Upon sitting in w/c pt noted that he feels he's currently having a bowel movement. PTA moved w/c to bathroom room door and pt took several steps to elevated toilet. Total A for clothing management with pt holding onto RW with minA for hand placement of LUE. Pt then performed ambulatory transfer to w/c x approx 15 ft modA with PTA assisting maintaining LUE on RW. Pt remained in w/c at end of session with half lap tray in place, call bell within reach, and needs met.      Therapy Documentation Precautions:  Precautions Precautions: Fall Restrictions Weight Bearing Restrictions: No General:   Vital Signs: Therapy Vitals Temp: 97.8 F (36.6 C) Temp Source: Oral Pulse Rate: (!) 119 Resp: 19 BP: 118/83 Patient Position (if appropriate): Lying Oxygen Therapy SpO2: 90 % O2 Device: Room Air   See Function Navigator for Current Functional Status.   Therapy/Group: Individual Therapy  Pollie Poma  Nuha Degner, PTA  11/22/2017, 3:54 PM

## 2017-11-23 ENCOUNTER — Inpatient Hospital Stay (HOSPITAL_COMMUNITY): Payer: Medicare HMO | Admitting: Physical Therapy

## 2017-11-23 DIAGNOSIS — I482 Chronic atrial fibrillation: Secondary | ICD-10-CM

## 2017-11-23 LAB — GLUCOSE, CAPILLARY
GLUCOSE-CAPILLARY: 137 mg/dL — AB (ref 70–99)
GLUCOSE-CAPILLARY: 86 mg/dL (ref 70–99)
GLUCOSE-CAPILLARY: 153 mg/dL — AB (ref 70–99)
Glucose-Capillary: 109 mg/dL — ABNORMAL HIGH (ref 70–99)

## 2017-11-23 NOTE — Progress Notes (Signed)
Physical Therapy Session Note  Patient Details  Name: RENNY REMER MRN: [id number] Date of Birth: 08-30-1934  Today's Date: 11/23/2017 PT Individual Time: 1020-1050 PT Individual Time Calculation (min): 30 min   Short Term Goals: Week 1:  PT Short Term Goal 1 (Week 1): Pt will maintain dynamic sitting balance w/ supervision PT Short Term Goal 2 (Week 1): Pt will initiate stair training PT Short Term Goal 3 (Week 1): Pt will ambulate 50' w/ mod assist using LRAD PT Short Term Goal 4 (Week 1): Pt will perform bed<>chair transfers w/ min assist  Skilled Therapeutic Interventions/Progress Updates:   Pt received supine in bed and agreeable to PT. Supine>sit transfer with supervision assist, bed rails  And min cues for awareness of the LUE. Sit<>stand with min assist for PT to assist pt pull pants to waist.   Gait training x 82f with min assist to stabilize the  LUE on RW with Dycem to improve stability of hand. Pt noted to have improved step length and height of the LLE on this day, but did initially require cues for step height.   Nustep reciprocal movement training x 8 minutes with L hand splint to stabilize hand and force use. Min cues from PT for proper ROM and speed, as well as symmetrical movement Bil.   [facility] to [facility] and all needs met.          Therapy Documentation Precautions:  Precautions Precautions: Fall Restrictions Weight Bearing Restrictions: No   Pain:   denies   See Function Navigator for Current Functional Status.   Therapy/Group: Individual Therapy  ALorie Phenix9/09/2017, 12:57 PM

## 2017-11-23 NOTE — Progress Notes (Signed)
Patient ID: Chad Franklin, male   DOB: 1934-11-19, 83 y.o.   MRN: [id number]   Chad Franklin is a 83 y.o. male is admitted for CIR with left-sided weakness secondary to an acute subcortical infarct in posterior right frontal lobe.  He has had a remote left parietal occipital infarction in the past.  He has been followed by cardiology with atrial fibrillation  Past Medical History:  Diagnosis Date  . Bilateral swelling of feet   . COPD (chronic obstructive pulmonary disease) (HCC)   . Depression   . Diabetes mellitus   . GERD (gastroesophageal reflux disease)   . Palpitations   . Stroke [facility])    2     Subjective: No new complaints. No new problems. Slept well.   Objective: Vital signs in last 24 hours: Temp:  [97.8 F (36.6 C)-99.4 F (37.4 C)] 98 F (36.7 C) (09/07 0510) Pulse Rate:  [70-119] 105 (09/07 0510) Resp:  [18-19] 18 (09/07 0510) BP: (96-126)/(64-83) 114/77 (09/07 0510) SpO2:  [90 %-92 %] 92 % (09/07 0510) Weight change:  Last BM Date: 11/21/17  Intake/Output from previous day: 09/06 0701 - 09/07 0700 In: 666 [P.O.:666] Out: -  Last cbgs: CBG (last 3)  Recent Labs    11/22/17 1636 11/22/17 2125 11/23/17 0631  GLUCAP 132* 135* 109*   Patient Vitals for the past 24 hrs:  BP Temp Temp src Pulse Resp SpO2  11/23/17 0510 114/77 98 F (36.7 C) Oral (!) 105 18 92 %  11/22/17 2048 96/64 99.4 F (37.4 C) Oral (!) 106 19 90 %  11/22/17 1740 126/75 - - 70 - -  11/22/17 1548 118/83 97.8 F (36.6 C) Oral (!) 119 19 90 %     Physical Exam General: No apparent distress obese HEENT: not dry Lungs: Normal effort. Lungs clear to auscultation, no crackles or wheezes. Cardiovascular: Irregular rate and rhythm, no edema Abdomen: S/NT/ND; BS(+) Musculoskeletal:  unchanged Neurological: No new neurological deficits with left-sided weakness Extremities.  No edema Mental state: Alert, oriented, cooperative    Lab Results: BMET    Component Value Date/Time   NA 137 11/21/2017 0548   K 3.8 11/21/2017 0548   CL 102 11/21/2017 0548   CO2 26 11/21/2017 0548   GLUCOSE 106 (H) 11/21/2017 0548   BUN 20 11/21/2017 0548   CREATININE 0.88 11/21/2017 0548   CALCIUM 8.0 (L) 11/21/2017 0548   GFRNONAA >60 11/21/2017 0548   GFRAA >60 11/21/2017 0548   CBC    Component Value Date/Time   WBC 9.5 11/21/2017 0548   RBC 4.27 11/21/2017 0548   HGB 12.5 (L) 11/21/2017 0548   HCT 39.6 11/21/2017 0548   PLT 177 11/21/2017 0548   MCV 92.7 11/21/2017 0548   MCH 29.3 11/21/2017 0548   MCHC 31.6 11/21/2017 0548   RDW 13.7 11/21/2017 0548   LYMPHSABS 1.2 11/21/2017 0548   MONOABS 1.0 11/21/2017 0548   EOSABS 0.2 11/21/2017 0548   BASOSABS 0.0 11/21/2017 0548    Medications: I have reviewed the patient's current medications.  Assessment/Plan:  Functional deficits secondary to acute subcortical right frontal infarct in the setting of remote left parietal occipital infarct.  Continue CIR DVT prophylaxis.  Continue apixaban Essential hypertension stable Diabetes mellitus.  Nice glycemic control Atrial fibrillation.  Continue rate control and chronic anticoagulation.  Follow-up cardiology  Length of stay, days: 3  Gordy Savers , MD 11/23/2017, 10:55 AM

## 2017-11-24 ENCOUNTER — Inpatient Hospital Stay (HOSPITAL_COMMUNITY): Payer: Medicare HMO | Admitting: Physical Therapy

## 2017-11-24 ENCOUNTER — Inpatient Hospital Stay (HOSPITAL_COMMUNITY): Payer: Medicare HMO | Admitting: Occupational Therapy

## 2017-11-24 DIAGNOSIS — I481 Persistent atrial fibrillation: Secondary | ICD-10-CM

## 2017-11-24 LAB — GLUCOSE, CAPILLARY
GLUCOSE-CAPILLARY: 100 mg/dL — AB (ref 70–99)
GLUCOSE-CAPILLARY: 92 mg/dL (ref 70–99)
GLUCOSE-CAPILLARY: 115 mg/dL — AB (ref 70–99)
Glucose-Capillary: 100 mg/dL — ABNORMAL HIGH (ref 70–99)

## 2017-11-24 NOTE — Progress Notes (Signed)
Progress Note  Patient Name: Chad Franklin Date of Encounter: 11/24/2017  Primary Cardiologist:   No primary care provider on file.   Subjective   No chest pain.    Inpatient Medications    Scheduled Meds: . apixaban  5 mg Oral BID  . carvedilol  18.75 mg Oral BID WC  . digoxin  0.125 mg Oral Daily  . furosemide  40 mg Oral Daily  . insulin aspart  0-15 Units Subcutaneous TID WC  . insulin aspart  0-5 Units Subcutaneous QHS  . losartan  25 mg Oral Daily  . Melatonin  1.5 mg Oral QHS  . multivitamin with minerals  1 tablet Oral Daily  . multivitamin with minerals  1 tablet Oral Daily  . PARoxetine  20 mg Oral Daily  . potassium chloride  20 mEq Oral Daily  . pravastatin  10 mg Oral Daily   Continuous Infusions:  PRN Meds: acetaminophen, alum & mag hydroxide-simeth, bisacodyl, diphenhydrAMINE, guaiFENesin-dextromethorphan, ipratropium, levalbuterol, polyethylene glycol, prochlorperazine **OR** prochlorperazine **OR** prochlorperazine, senna-docusate, sodium phosphate   Vital Signs    Vitals:   11/23/17 0510 11/23/17 1454 11/23/17 2013 11/24/17 0513  BP: 114/77 120/64 115/67 103/83  Pulse: (!) 105 95 98 92  Resp: 18 18 18 19   Temp: 98 F (36.7 C) 98.1 F (36.7 C) 98.6 F (37 C) 98.3 F (36.8 C)  TempSrc: Oral Oral Oral Oral  SpO2: 92% 94% 92% 95%  Weight:      Height:        Intake/Output Summary (Last 24 hours) at 11/24/2017 0946 Last data filed at 11/23/2017 2004 Gross per 24 hour  Intake 600 ml  Output 1 ml  Net 599 ml   Filed Weights   11/20/17 1848  Weight: 104.5 kg    Telemetry    NA   - Personally Reviewed  ECG    NA - Personally Reviewed  Physical Exam   GEN: No  acute distress.   Neck: No  JVD Cardiac:  Irregular RR, no murmurs, rubs, or gallops.  Respiratory: Clear   to auscultation bilaterally. GI: Soft, nontender, non-distended, normal bowel sounds  MS:   edema; No deformity. Neuro:   Right sided weakness.   Psych: Oriented and  appropriate    Labs    Chemistry Recent Labs  Lab 11/19/17 0452 11/20/17 0508 11/21/17 0548  NA 139 139 137  K 3.6 3.7 3.8  CL 101 102 102  CO2 30 27 26   GLUCOSE 120* 124* 106*  BUN 23 19 20   CREATININE 0.91 0.83 0.88  CALCIUM 8.3* 8.3* 8.0*  PROT  --   --  6.5  ALBUMIN  --   --  2.7*  AST  --   --  35  ALT  --   --  29  ALKPHOS  --   --  64  BILITOT  --   --  1.5*  GFRNONAA >60 >60 >60  GFRAA >60 >60 >60  ANIONGAP 8 10 9      Hematology Recent Labs  Lab 11/18/17 0250 11/21/17 0548  WBC 8.4 9.5  RBC 4.09* 4.27  HGB 12.0* 12.5*  HCT 38.9* 39.6  MCV 95.1 92.7  MCH 29.3 29.3  MCHC 30.8 31.6  RDW 13.9 13.7  PLT 144* 177    Cardiac Enzymes No results for input(s): TROPONINI in the last 168 hours.  No results for input(s): TROPIPOC in the last 168 hours.   BNPNo results for input(s): BNP, PROBNP in the last  168 hours.   DDimer No results for input(s): DDIMER in the last 168 hours.   Radiology    No results found.  Cardiac Studies   Echo 11/15/17 Study Conclusions  - Left ventricle: The cavity size was mildly dilated. There was mild focal basal hypertrophy of the septum. Systolic function was moderately reduced. The estimated ejection fraction was in the range of 35% to 40%. - Regional wall motion abnormality: Akinesis of the mid anterior and basal-mid anteroseptal myocardium; hypokinesis of the apical anterior, mid inferoseptal, apical inferior, apical septal, apical lateral, and apical myocardium. - Aortic valve: There was trivial regurgitation. - Mitral valve: Calcified annulus. Mildly thickened leaflets . There was moderate regurgitation. - Left atrium: The atrium was massively dilated. - Right ventricle: The cavity size was moderately dilated. Wall thickness was normal. Systolic function was mildly reduced. - Right atrium: The atrium was moderately dilated. - Atrial septum: No defect or patent foramen ovale was identified. -  Tricuspid valve: There was mild regurgitation. - Pulmonic valve: There was mild regurgitation. - Pulmonary arteries: Systolic pressure was moderately increased. PA peak pressure: 44 mm Hg (S).  Impressions:  - Reduced LVEF with wall motion abnormalities across the anteroseptal, anterior, inferoseptal, and apical myocardium. Consider multivessel CAD vs. takotsubo. Massively dilated LA with moderate MR. Elevated PASP of 44 mmHg.   Patient Profile     83 y.o. male with PMH of DM2, COPD, permanent atrial fibrillation on chronic Coumadin and prior CVA 2012/2013 who is being followed by cardiology for atrial fibrillation with newly decreased LV function and the need for DOAC therapy  Assessment & Plan    ATRIAL FIB:  Rate is slightly up but better.  Continue current dose of beta blocker.     ACUTE SYSTOLIC HF:   BP will not allow med titration.   No change in therapy.  [facility] will sign off.   Medication Recommendations:  Continue cardiac meds as listed Other recommendations (labs, testing, etc):  None Follow up as an outpatient:  Schedule one month follow up with Dr. Cristal Deer at our [facility] office for [facility].  (I have not scheduled this.)  For questions or updates, please contact [facility] Please consult [email or web address] for contact info under Cardiology/STEMI.   Signed, Rollene Rotunda, MD  11/24/2017, 9:46 AM

## 2017-11-24 NOTE — Progress Notes (Signed)
Occupational Therapy Session Note  Patient Details  Name: Chad Franklin MRN: [id number] Date of Birth: 13-Oct-1934  Today's Date: 11/24/2017 OT Individual Time: 0950-1100 OT Individual Time Calculation (min): 70 min   Short Term Goals: Week 1:  OT Short Term Goal 1 (Week 1): Pt will perform LB dressing with mod A  OT Short Term Goal 2 (Week 1): Pt will perform sit to stands with min A using LRAD OT Short Term Goal 3 (Week 1): Pt will perform 3/3 toileting tasks with mod A  OT Short Term Goal 4 (Week 1): Pt will perform 2 grooming tasks standing at sink with min A   Skilled Therapeutic Interventions/Progress Updates:    Pt greeted in w/c with spouse present. No c/o pain. ADL needs met. Took pt to [facility] and worked on L UE weightbearing and functional ambulation with device in [place], food court, and gift shop. Pt required steady assist with RW in moderately stimulating community environments. Cues required for safe DME positioning and for attending to Lt side of body. Increased challenge when ambulating over carpeted entry of [employer]. He transferred to booth in food court with cues for technique and Min A, as he reported he and spouse go out to eat at home. Pt then completed public restroom transfer via stand pivot in handicapped stall. He required steady assist for transfer and dynamic balance when completing hygiene post BM. Max A for clothing mgt due to urgency and tightness of garments. Handwashing completed while standing at sink. At end of session he was escorted back to room. Discussed with pt/spouse self ROM and AAROM techniques to complete for promoting functional return. Educated them on impingement and advised to keep AAROM below shoulder level. At end of session he was left with spouse and all needs.      Therapy Documentation Precautions:  Precautions Precautions: Fall Restrictions Weight Bearing Restrictions: (P) No Pain: No c/o pain during tx    ADL: ADL Grooming: Moderate  assistance Where Assessed-Grooming: Standing at sink Upper Body Bathing: Moderate assistance Where Assessed-Upper Body Bathing: Shower Lower Body Bathing: Maximal assistance Where Assessed-Lower Body Bathing: Shower Upper Body Dressing: Moderate assistance Where Assessed-Upper Body Dressing: Wheelchair Lower Body Dressing: Maximal assistance Where Assessed-Lower Body Dressing: Wheelchair Toileting: Maximal assistance Where Assessed-Toileting: Glass blower/designer: Moderate assistance Toilet Transfer Method: Stand pivot Toilet Transfer Equipment: Grab bars, Raised toilet seat Social research officer, government: Moderate assistance Social research officer, government Method: Radiographer, therapeutic: Grab bars, Shower seat with back     See Function Navigator for Current Functional Status.   Therapy/Group: Individual Therapy   [place] A Shakina Choy 11/24/2017, 12:40 PM

## 2017-11-24 NOTE — Progress Notes (Signed)
Patient ID: Chad Franklin, male   DOB: 08/12/1934, 83 y.o.   MRN: [id number]   Chad Franklin is a 83 y.o. male admitted for CIR with functional deficits from left-sided weakness secondary to an acute subcortical infarct involving the posterior right frontal lobe He has had a history of prior left parietal occipital infarction.  He has atrial fibrillation  Subjective: No new complaints. No new problems.  Comfortable night.  No complaints this a.m.  Past Medical History:  Diagnosis Date  . Bilateral swelling of feet   . COPD (chronic obstructive pulmonary disease) (HCC)   . Depression   . Diabetes mellitus   . GERD (gastroesophageal reflux disease)   . Palpitations   . Stroke [facility])    2     Objective: Vital signs in last 24 hours: Temp:  [98.1 F (36.7 C)-98.6 F (37 C)] 98.3 F (36.8 C) (09/08 0513) Pulse Rate:  [92-98] 92 (09/08 0513) Resp:  [18-19] 19 (09/08 0513) BP: (103-120)/(64-83) 103/83 (09/08 0513) SpO2:  [92 %-95 %] 95 % (09/08 0513) Weight change:  Last BM Date: 11/23/17  Intake/Output from previous day: 09/07 0701 - 09/08 0700 In: 822 [P.O.:822] Out: 1 [Stool:1] Last cbgs: CBG (last 3)  Recent Labs    11/23/17 1701 11/23/17 2139 11/24/17 0623  GLUCAP 86 153* 100*   Patient Vitals for the past 24 hrs:  BP Temp Temp src Pulse Resp SpO2  11/24/17 0513 103/83 98.3 F (36.8 C) Oral 92 19 95 %  11/23/17 2013 115/67 98.6 F (37 C) Oral 98 18 92 %  11/23/17 1454 120/64 98.1 F (36.7 C) Oral 95 18 94 %    Physical Exam General: No apparent distress   HEENT: Dentures in place Lungs: Normal effort. Lungs clear to auscultation, no crackles or wheezes. Cardiovascular: Irregular rhythm with controlled ventricular response Abdomen: S/NT/ND; BS(+) Musculoskeletal:  unchanged Neurological: No new neurological deficits with left-sided weakness Extremities.  No edema Mental state: Alert, oriented, cooperative    Lab Results: BMET    Component Value  Date/Time   NA 137 11/21/2017 0548   K 3.8 11/21/2017 0548   CL 102 11/21/2017 0548   CO2 26 11/21/2017 0548   GLUCOSE 106 (H) 11/21/2017 0548   BUN 20 11/21/2017 0548   CREATININE 0.88 11/21/2017 0548   CALCIUM 8.0 (L) 11/21/2017 0548   GFRNONAA >60 11/21/2017 0548   GFRAA >60 11/21/2017 0548   CBC    Component Value Date/Time   WBC 9.5 11/21/2017 0548   RBC 4.27 11/21/2017 0548   HGB 12.5 (L) 11/21/2017 0548   HCT 39.6 11/21/2017 0548   PLT 177 11/21/2017 0548   MCV 92.7 11/21/2017 0548   MCH 29.3 11/21/2017 0548   MCHC 31.6 11/21/2017 0548   RDW 13.7 11/21/2017 0548   LYMPHSABS 1.2 11/21/2017 0548   MONOABS 1.0 11/21/2017 0548   EOSABS 0.2 11/21/2017 0548   BASOSABS 0.0 11/21/2017 0548    Medications: I have reviewed the patient's current medications.  Assessment/Plan:  Functional deficits with left-sided weakness secondary to right frontal  infarction.  Prior left parietal occipital infarct.  Continue CIR with OT and PT DVT prophylaxis/chronic atrial fibrillation.  Continue apixaban Diabetes mellitus.  Remains nicely controlled Chronic atrial fibrillation.  Continue chronic anticoagulation and rate control    Length of stay, days: 4  Gordy Savers , MD 11/24/2017, 9:53 AM

## 2017-11-24 NOTE — Progress Notes (Addendum)
Physical Therapy Session Note  Patient Details  Name: Chad Franklin MRN: [id number] Date of Birth: 05/22/1934  Today's Date: 11/24/2017 PT Individual Time: 7654-6503 and 5465-6812 PT Individual Time Calculation (min): 58 min and 54 min  Short Term Goals: Week 1:  PT Short Term Goal 1 (Week 1): Pt will maintain dynamic sitting balance w/ supervision PT Short Term Goal 2 (Week 1): Pt will initiate stair training PT Short Term Goal 3 (Week 1): Pt will ambulate 60' w/ mod assist using LRAD PT Short Term Goal 4 (Week 1): Pt will perform bed<>chair transfers w/ min assist  Skilled Therapeutic Interventions/Progress Updates:    pt performs supine to sit with use of bedrails, increased time and supervision.  Sit <> stand with min A, mod A for balance when pulling up pants, total A for button on pants.  Stand pivot transfers throughout session with min A.  Gait trainign with RW with min A for trunk control and assist with Lt UE to control RW.  Gait without device with min A multiple attempts with manual facilitation to grade wt shifts, tactile input through Lt shoulder, min A for balance.  Standing LT LE strengthening with step ups with Lt LE, tap ups with Rt LE with mod A and manual facilitation for Lt stance control.  Standing reaching and squatting task with min/mod A for balance.  Pt reports need to use restroom. Toilet transfers with min A with use of grab bar, mod A for clothing management, total A for hygiene. Pt left in room with wife present, needs at hand.  Session 2: no c/o pain.  Gait without AD with obstacle negotiation and in home and controlled environments with min/mod A, manual facilitation for wt shifts and attention to Lt LE placement, min/mod A for balance especially with tight turns.  Furniture transfers in ADL apartment to recliner, couch and bed with pt performing all with min A, cues for UE placement.  Toilet transfers with min A, min A for clothing management, total A for hygiene.   nustep x 6 minutes for UE/LE strength and endurance. Pt left in bed with needs at hand, alarm set.  Therapy Documentation Precautions:  Precautions Precautions: Fall Restrictions Weight Bearing Restrictions: No Pain: No c/o pain  Therapy/Group: Individual Therapy  Thelmer Legler 11/24/2017, 8:59 AM

## 2017-11-25 ENCOUNTER — Inpatient Hospital Stay (HOSPITAL_COMMUNITY): Payer: Medicare HMO | Admitting: Physical Therapy

## 2017-11-25 ENCOUNTER — Encounter (HOSPITAL_COMMUNITY): Payer: Self-pay | Admitting: Emergency Medicine

## 2017-11-25 ENCOUNTER — Inpatient Hospital Stay (HOSPITAL_COMMUNITY): Payer: Medicare HMO | Admitting: Occupational Therapy

## 2017-11-25 ENCOUNTER — Other Ambulatory Visit: Payer: Self-pay

## 2017-11-25 DIAGNOSIS — E46 Unspecified protein-calorie malnutrition: Secondary | ICD-10-CM

## 2017-11-25 DIAGNOSIS — E8809 Other disorders of plasma-protein metabolism, not elsewhere classified: Secondary | ICD-10-CM

## 2017-11-25 DIAGNOSIS — E876 Hypokalemia: Secondary | ICD-10-CM

## 2017-11-25 LAB — GLUCOSE, CAPILLARY
GLUCOSE-CAPILLARY: 123 mg/dL — AB (ref 70–99)
Glucose-Capillary: 88 mg/dL (ref 70–99)
Glucose-Capillary: 94 mg/dL (ref 70–99)
Glucose-Capillary: 106 mg/dL — ABNORMAL HIGH (ref 70–99)

## 2017-11-25 MED ORDER — PRO-STAT SUGAR FREE PO LIQD
30.0000 mL | Freq: Two times a day (BID) | ORAL | Status: DC
  Administered 2017-11-25 – 2017-12-04 (×19): 30 mL via ORAL
  Filled 2017-11-25 (×19): qty 30

## 2017-11-25 MED ORDER — MELATONIN 3 MG PO TABS
3.0000 mg | ORAL_TABLET | Freq: Every day | ORAL | Status: DC
  Administered 2017-11-25 – 2017-12-03 (×7): 3 mg via ORAL
  Filled 2017-11-25 (×9): qty 1

## 2017-11-25 NOTE — Progress Notes (Signed)
Occupational Therapy Session Note  Patient Details  Name: Chad Franklin MRN: [id number] Date of Birth: 08-Mar-1935  Today's Date: 11/25/2017 OT Individual Time:  0845-1000 (75 mins)       Short Term Goals: Week 1:  OT Short Term Goal 1 (Week 1): Pt will perform LB dressing with mod A  OT Short Term Goal 2 (Week 1): Pt will perform sit to stands with min A using LRAD OT Short Term Goal 3 (Week 1): Pt will perform 3/3 toileting tasks with mod A  OT Short Term Goal 4 (Week 1): Pt will perform 2 grooming tasks standing at sink with min A   Skilled Therapeutic Interventions/Progress Updates:    Pt supine in bed upon entry with no reports of pain and eager to begin session. Pt performed all transfers this session with guarding-min A for balance and safety. Pt performed sit to stand transfers x2 from EOB > RW with therapist stabilizing L hand on RW and then transferred to w/c. Pt performed toileting tasks on elevated BSC. Showering tasks completed seated on BSC in shower with therapist stabilizing elbow and wrist of LUE for use during tasks to facilitate functional movement patterns. Pt ambulated to w/c with RW with noted LUE weakness and inattention for L UE placement and maintaining of position on RW requiring VC's from therapist to maintain positioning for maximum UE support. Pt performed dressing tasks seated in w/c with difficulty orientating UB shirt requring therapist to thread L UE, however pt with good recall of hemi technique. Pt performed grooming tasks standing at sink with same method as in shower.   Seated in w/c, pt performed towel pushes on elevating table with R hand on L to facilitate weight bearing during elbow flexion/extension and shoulder flexion/extension with therapist stabilizing wrist and facilitating tricep extension during task. Pt left in room with chair alarm set, all needs in reach and wife present in room.   Therapy Documentation Precautions:  Precautions Precautions:  Fall Restrictions Weight Bearing Restrictions: No  See Function Navigator for Current Functional Status.   Therapy/Group: Individual Therapy  Victory Dresden 11/25/2017, 7:22 AM

## 2017-11-25 NOTE — Progress Notes (Signed)
Physical Therapy Session Note  Patient Details  Name: LENNEX PIETILA MRN: [id number] Date of Birth: 1934-11-09  Today's Date: 11/25/2017 PT Individual Time: 1045-1200 PT Individual Time Calculation (min): 75 min   Short Term Goals: Week 1:  PT Short Term Goal 1 (Week 1): Pt will maintain dynamic sitting balance w/ supervision PT Short Term Goal 2 (Week 1): Pt will initiate stair training PT Short Term Goal 3 (Week 1): Pt will ambulate 79' w/ mod assist using LRAD PT Short Term Goal 4 (Week 1): Pt will perform bed<>chair transfers w/ min assist  Skilled Therapeutic Interventions/Progress Updates:    Patient received up in [facility], very pleasant and willing to participate in PT today. Able to ambulate 179f, then 1557ffrom his room with RW, min guard, and 1 seated rest break/1 standing rest break, then proceeded to ride Nustep with LEs only for B LE strength and endurance on level 6 for 10 minutes. Otherwise worked on endurance and balance activities with cross midline reaching, visual scanning, and reaction time incorporated with various settings and activities using Dynavision on and off of foam pad and min guard today. He was able to perform transfer to and from toilet with close S, grab bars in bathroom, cues for safety and locking breaks on WC. At end of session continued with gait training and patient again able to gait train approximately 10033fnd then 150f65fth RW, min guard, and one seated rest break. [facility], chair alarm active, family present.   Therapy Documentation Precautions:  Precautions Precautions: Fall Restrictions Weight Bearing Restrictions: No General:   Pain: Pain Assessment Pain Scale: 0-10 Pain Score: 0-No pain    See Function Navigator for Current Functional Status.   Therapy/Group: Individual Therapy  KrisDeniece Ree DPT, CBIS  Supplemental Physical Therapist [facility] CenterPager [phone] Rehab Office  [phone]/11/2017, 12:27 PM

## 2017-11-25 NOTE — Progress Notes (Signed)
[facility] PHYSICAL MEDICINE & REHABILITATION     PROGRESS NOTE  Subjective/Complaints:  Patient seen lying in bed this morning. He states he did not sleep well overnight, only as result of being in the hospital. He states he had a good weekend.  ROS: Denies CP, SOB, nausea, vomiting, diarrhea.  Objective: Vital Signs: Blood pressure 124/82, pulse 75, temperature 97.8 F (36.6 C), temperature source Oral, resp. rate 20, height 5\' 8"  (1.727 m), weight 104.5 kg, SpO2 96 %. No results found. No results for input(s): WBC, HGB, HCT, PLT in the last 72 hours. No results for input(s): NA, K, CL, GLUCOSE, BUN, CREATININE, CALCIUM in the last 72 hours.  Invalid input(s): CO CBG (last 3)  Recent Labs    11/24/17 1645 11/24/17 2108 11/25/17 0647  GLUCAP 92 115* 88    Wt Readings from Last 3 Encounters:  11/20/17 104.5 kg  11/14/17 104.7 kg    Physical Exam:  BP 124/82 (BP Location: Left Arm)   Pulse 75   Temp 97.8 F (36.6 C) (Oral)   Resp 20   Ht 5\' 8"  (1.727 m)   Wt 104.5 kg   SpO2 96%   BMI 35.03 kg/m  Constitutional: No distress . Vital signs reviewed. HENT: Normocephalic.  Atraumatic. Eyes: EOMI. No discharge. Cardiovascular: irregularly irregular. No JVD. Respiratory: CTA Bilaterally. Normal effort. GI: BS +. Non-distended. Musc: No edema or tenderness in extremities. Neurological: He is alert.  HOH Motor: Right upper extremity 5/5 proximal distal Right lower extremity: 4/5 proximal distal Left upper extremity: 3/5 proximal distal with apraxia, stable Left lower extremity: 4/5 proximal to distal, stable  Skin: Skin is warm and dry.  Psychiatric: His speech is delayed. He is slowed.    Assessment/Plan: 1. Functional deficits secondary to small acute cortical/subcortical infarct and posterior right frontal lobe and remote left parieto-occipital infarct which require 3+ hours per day of interdisciplinary therapy in a comprehensive inpatient rehab  setting. Physiatrist is providing close team supervision and 24 hour management of active medical problems listed below. Physiatrist and rehab team continue to assess barriers to discharge/monitor patient progress toward functional and medical goals.  Function:  Bathing Bathing position Bathing activity did not occur: Refused Position: Shower(seated on BSC)  Bathing parts Body parts bathed by patient: Left arm, Chest, Abdomen, Front perineal area, Right upper leg, Left upper leg Body parts bathed by helper: Right lower leg, Left lower leg, Back, Buttocks, Right arm  Bathing assist Assist Level: 2 helpers      Upper Body Dressing/Undressing Upper body dressing   What is the patient wearing?: Pull over shirt/dress     Pull over shirt/dress - Perfomed by patient: Thread/unthread right sleeve, Put head through opening Pull over shirt/dress - Perfomed by helper: Thread/unthread left sleeve, Pull shirt over trunk        Upper body assist        Lower Body Dressing/Undressing Lower body dressing   What is the patient wearing?: Underwear, Pants, Non-skid slipper socks   Underwear - Performed by helper: Pull underwear up/down, Thread/unthread right underwear leg, Thread/unthread left underwear leg   Pants- Performed by helper: Thread/unthread right pants leg, Thread/unthread left pants leg, Pull pants up/down   Non-skid slipper socks- Performed by helper: Don/doff right sock, Don/doff left sock                  Lower body assist Assist for lower body dressing: 2 Helpers      Toileting Toileting Toileting activity did  not occur: No continent bowel/bladder event Toileting steps completed by patient: Adjust clothing prior to toileting, Adjust clothing after toileting, Performs perineal hygiene Toileting steps completed by helper: Adjust clothing prior to toileting, Adjust clothing after toileting, Performs perineal hygiene Toileting Assistive Devices: Grab bar or rail   Toileting assist Assist level: Touching or steadying assistance (Pt.75%)   Transfers Chair/bed transfer   Chair/bed transfer method: Stand pivot Chair/bed transfer assist level: Touching or steadying assistance (Pt > 75%) Chair/bed transfer assistive device: Armrests, Patent attorney     Max distance: 40 Assist level: Touching or steadying assistance (Pt > 75%)   Wheelchair   Type: Manual Max wheelchair distance: 150' Assist Level: Supervision or verbal cues  Cognition Comprehension Comprehension assist level: Follows basic conversation/direction with extra time/assistive device  Expression Expression assist level: Expresses basic needs/ideas: With no assist  Social Interaction Social Interaction assist level: Interacts appropriately 90% of the time - Needs monitoring or encouragement for participation or interaction.  Problem Solving Problem solving assist level: Solves basic 90% of the time/requires cueing < 10% of the time  Memory Memory assist level: Recognizes or recalls 90% of the time/requires cueing < 10% of the time    Medical Problem List and Plan: 1.  Left-sided weakness as well as cognitive deficits  secondary to small acute cortical/subcortical infarct and posterior right frontal lobe and remote left parieto-occipital infarct.  Cont CIR 2.  DVT Prophylaxis/Anticoagulation: Pharmaceutical: Other (comment)--Apixaban.  3. Pain Management: N/A 4. Mood: LCSW to follow for evaluation and support.  5. Neuropsych: This patient is not fully capable of making decisions on his own behalf. 6. Skin/Wound Care: Routine pressure relief measures.  7. Fluids/Electrolytes/Nutrition: Monitor I/O.   BMP within acceptable range on 9/5 8. HTN: Monitor BP bid. On coreg, Cozaar and Lasix.   Controlled on 9/9  Appreciate cardiac recs 9. T2DM?: Has been off medications X 1 year. Hgb A1C- 5.8.    Relatively controlled on 9/9 10. COPD: SOB treated with ipratropium and  Xopenex. Cough resolving. Will monitor with increase in activity.     Currently on supplemental oxygen which she is not on at home.   ?Weaned  11. A fib with RVR: Monitor HR bid--continue coreg, digoxin and apixaban.  12.  H/o depression: managed with paxil  13. Sleep disturbance:  Melatonin started on 9/5, increased on 9/9 14. Acute blood loss anemia  Hemoglobin 12.5 on 9/5  Continue to monitor 15. Hypoalbuminemia  Supplement initiated on 9/9  LOS (Days) 5 A FACE TO FACE EVALUATION WAS PERFORMED  Ankit Karis Juba 11/25/2017 9:35 AM

## 2017-11-25 NOTE — Plan of Care (Signed)
  Problem: RH SAFETY Goal: RH STG ADHERE TO SAFETY PRECAUTIONS W/ASSISTANCE/DEVICE Description STG Adhere to Safety Precautions With min Assistance/Device.  Outcome: Progressing Goal: RH STG DECREASED RISK OF FALL WITH ASSISTANCE Description STG Decreased Risk of Fall With min Assistance.  Outcome: Progressing   Call light at hand. Proper footwear.

## 2017-11-25 NOTE — Progress Notes (Signed)
Physical Therapy Session Note  Patient Details  Name: Chad Franklin MRN: [id number] Date of Birth: Sep 14, 1934  Today's Date: 11/25/2017 PT Individual Time: 1350-1430 PT Individual Time Calculation (min): 40 min   Short Term Goals: Week 1:  PT Short Term Goal 1 (Week 1): Pt will maintain dynamic sitting balance w/ supervision PT Short Term Goal 2 (Week 1): Pt will initiate stair training PT Short Term Goal 3 (Week 1): Pt will ambulate 52' w/ mod assist using LRAD PT Short Term Goal 4 (Week 1): Pt will perform bed<>chair transfers w/ min assist  Skilled Therapeutic Interventions/Progress Updates: Pt received seated in w/c, denies pain and agreeable to treatment. Transported to gym totalA in w/c for energy conservation. Gait with RW and min guard x50' including turns. Sit <>stand 2x10 reps no UE support with min guard. Standing alternating toe taps to 4" step with BUE on RW progressed to R HHA only for focus on LE coordination, LLE stance control, activity tolerance. Gait 2x90', 1x120' with R HHA, +2 for tactile cueing to L glutes in stance for activation and hip extension. Backwards walking R HHA x15' with min/modA. Stand pivot min guard to return to bed. Sit >supine with S. Remained supine in bed, alarm intact and all needs in reach.      Therapy Documentation Precautions:  Precautions Precautions: Fall Restrictions Weight Bearing Restrictions: No Pain: Pain Assessment Pain Scale: 0-10 Pain Score: 0-No pain   See Function Navigator for Current Functional Status.   Therapy/Group: Individual Therapy  Harlon Ditty 11/25/2017, 3:40 PM

## 2017-11-26 ENCOUNTER — Inpatient Hospital Stay (HOSPITAL_COMMUNITY): Payer: Medicare HMO | Admitting: Occupational Therapy

## 2017-11-26 ENCOUNTER — Inpatient Hospital Stay (HOSPITAL_COMMUNITY): Payer: Medicare HMO | Admitting: Physical Therapy

## 2017-11-26 LAB — GLUCOSE, CAPILLARY
Glucose-Capillary: 110 mg/dL — ABNORMAL HIGH (ref 70–99)
Glucose-Capillary: 130 mg/dL — ABNORMAL HIGH (ref 70–99)
Glucose-Capillary: 94 mg/dL (ref 70–99)
Glucose-Capillary: 112 mg/dL — ABNORMAL HIGH (ref 70–99)

## 2017-11-26 NOTE — Progress Notes (Signed)
[facility]ILIT[facility]     PROGRESS NOTE  Subjective/Complaints:  Patient seen lying in bed this morning. He states he slept better overnight. He has questions about his discharge date.  ROS: denies CP, SOB, nausea, vomiting, diarrhea.  Objective: Vital Signs: Blood pressure 128/80, pulse 96, temperature 98 F (36.7 C), temperature source Oral, resp. rate 15, height 5\' 8"  (1.727 m), weight 104.5 kg, SpO2 95 %. No results found. No results for input(s): WBC, HGB, HCT, PLT in the last 72 hours. No results for input(s): NA, K, CL, GLUCOSE, BUN, CREATININE, CALCIUM in the last 72 hours.  Invalid input(s): CO CBG (last 3)  Recent Labs    11/25/17 1702 11/25/17 2102 11/26/17 0647  GLUCAP 94 106* 110*    Wt Readings from Last 3 Encounters:  11/20/17 104.5 kg  11/14/17 104.7 kg    Physical Exam:  BP 128/80 (BP Location: Left Arm)   Pulse 96   Temp 98 F (36.7 C) (Oral)   Resp 15   Ht 5\' 8"  (1.727 m)   Wt 104.5 kg   SpO2 95%   BMI 35.03 kg/m  Constitutional: No distress . Vital signs reviewed. HENT: Normocephalic.  Atraumatic. Eyes: EOMI. No discharge. Cardiovascular: irregularly irregular. No JVD. Respiratory: CTA bilaterally. Normal effort. GI: BS +. Non-distended. Musc: No edema or tenderness in extremities. Neurological: He is alert.  HOH Motor: Right upper extremity 5/5 proximal distal Right lower extremity: 4/5 proximal distal Left upper extremity: 3/5 proximal distal with apraxia, unchanged Left lower extremity: 4/5 proximal to distal, unchanged Skin: Skin is warm and dry.  Psychiatric: His speech is delayed. He is slowed.    Assessment/Plan: 1. Functional deficits secondary to small acute cortical/subcortical infarct and posterior right frontal lobe and remote left parieto-occipital infarct which require 3+ hours per day of interdisciplinary therapy in a comprehensive inpatient rehab setting. Physiatrist is providing close team  supervision and 24 hour management of active medical problems listed below. Physiatrist and rehab team continue to assess barriers to discharge/monitor patient progress toward functional and medical goals.  Function:  Bathing Bathing position Bathing activity did not occur: Refused Position: Shower(seated on BSC in shower )  Bathing parts Body parts bathed by patient: Left arm, Chest, Abdomen, Front perineal area, Right upper leg, Left upper leg Body parts bathed by helper: Right lower leg, Left lower leg, Back, Buttocks, Right arm  Bathing assist Assist Level: 2 helpers      Upper Body Dressing/Undressing Upper body dressing   What is the patient wearing?: Pull over shirt/dress     Pull over shirt/dress - Perfomed by patient: Thread/unthread right sleeve, Put head through opening Pull over shirt/dress - Perfomed by helper: Thread/unthread left sleeve, Pull shirt over trunk        Upper body assist Assist Level: Touching or steadying assistance(Pt > 75%)      Lower Body Dressing/Undressing Lower body dressing   What is the patient wearing?: Underwear, Pants, Non-skid slipper socks Underwear - Performed by patient: Pull underwear up/down Underwear - Performed by helper: Thread/unthread right underwear leg, Thread/unthread left underwear leg Pants- Performed by patient: Thread/unthread right pants leg, Thread/unthread left pants leg, Pull pants up/down Pants- Performed by helper: Thread/unthread right pants leg, Thread/unthread left pants leg, Pull pants up/down   Non-skid slipper socks- Performed by helper: Don/doff right sock, Don/doff left sock                  Lower body assist Assist for lower  body dressing: 2 Designer, multimedia activity did not occur: No continent bowel/bladder event Toileting steps completed by patient: Adjust clothing prior to toileting, Adjust clothing after toileting, Performs perineal hygiene Toileting steps completed  by helper: Adjust clothing prior to toileting, Adjust clothing after toileting, Performs perineal hygiene Toileting Assistive Devices: Grab bar or rail  Toileting assist Assist level: Touching or steadying assistance (Pt.75%)   Transfers Chair/bed transfer   Chair/bed transfer method: Stand pivot Chair/bed transfer assist level: Touching or steadying assistance (Pt > 75%) Chair/bed transfer assistive device: Armrests     Locomotion Ambulation     Max distance: 150 Assist level: Touching or steadying assistance (Pt > 75%)   Wheelchair   Type: Manual Max wheelchair distance: 20 Assist Level: Supervision or verbal cues  Cognition Comprehension Comprehension assist level: Follows basic conversation/direction with extra time/assistive device  Expression Expression assist level: Expresses basic needs/ideas: With no assist  Social Interaction Social Interaction assist level: Interacts appropriately 90% of the time - Needs monitoring or encouragement for participation or interaction.  Problem Solving Problem solving assist level: Solves basic 90% of the time/requires cueing < 10% of the time  Memory Memory assist level: Recognizes or recalls 90% of the time/requires cueing < 10% of the time    Medical Problem List and Plan: 1.  Left-sided weakness as well as cognitive deficits  secondary to small acute cortical/subcortical infarct and posterior right frontal lobe and remote left parieto-occipital infarct.  Cont CIR 2.  DVT Prophylaxis/Anticoagulation: Pharmaceutical: Other (comment)--Apixaban.  3. Pain Management: N/A 4. Mood: LCSW to follow for evaluation and support.  5. Neuropsych: This patient is not fully capable of making decisions on his own behalf. 6. Skin/Wound Care: Routine pressure relief measures.  7. Fluids/Electrolytes/Nutrition: Monitor I/O.   BMP within acceptable range on 9/5 8. HTN: Monitor BP bid. On coreg, Cozaar and Lasix.   Controlled on 9/10  Appreciate cardiac  recs 9. T2DM?: Has been off medications X 1 year. Hgb A1C- 5.8.    Relatively controlled on 9/10 10. COPD: SOB treated with ipratropium and Xopenex. Cough resolving. Will monitor with increase in activity.     Weaned supplemental oxygen 11. A fib with RVR: Monitor HR bid--continue coreg, digoxin and apixaban.  12.  H/o depression: managed with paxil  13. Sleep disturbance:  Melatonin started on 9/5, increased on 9/9  Improving 14. Acute blood loss anemia  Hemoglobin 12.5 on 9/5  Continue to monitor 15. Hypoalbuminemia  Supplement initiated on 9/9  LOS (Days) 6 A FACE TO FACE EVALUATION WAS PERFORMED  Ankit Karis Juba 11/26/2017 8:53 AM

## 2017-11-26 NOTE — Progress Notes (Signed)
Physical Therapy Session Note  Patient Details  Name: Chad Franklin MRN: [id number] Date of Birth: 09/30/1934  Today's Date: 11/26/2017 PT Individual Time: 1415-1455 PT Individual Time Calculation (min): 40 min   Short Term Goals: Week 1:  PT Short Term Goal 1 (Week 1): Pt will maintain dynamic sitting balance w/ supervision PT Short Term Goal 2 (Week 1): Pt will initiate stair training PT Short Term Goal 3 (Week 1): Pt will ambulate 86' w/ mod assist using LRAD PT Short Term Goal 4 (Week 1): Pt will perform bed<>chair transfers w/ min assist  Skilled Therapeutic Interventions/Progress Updates:   Pt in recliner and agreeable to therapy, denies pain. Pt ambulated around unit w/ CGA to min assist w/o AD, 50-150' at a time w/ 2-3 standing rest breaks. Seated rest breaks needed occasionally. Verbal cues for safety and gait pattern, tactile and manual cues for lateral weight shifting and pelvic stability during single leg stance. Occasional verbal cues for increased L foot clearance and step length. Worked on postural control in standing w/o UE support in gym while RUE performing reaching activities emphasizing trunk rotation. Challenged balance further w/ decreasing BOS, min guard to close supervision during tasks. Ultimately required RW for support upon ambulating back to room 2/2 fatigue. Ended session in recliner, call bell within reach and all needs met.    Therapy Documentation Precautions:  Precautions Precautions: Fall Restrictions Weight Bearing Restrictions: No Vital Signs: Therapy Vitals Temp: 98.1 F (36.7 C) Temp Source: Oral Pulse Rate: (!) 105 Resp: 19 BP: 110/76 Patient Position (if appropriate): Sitting Oxygen Therapy SpO2: 95 % O2 Device: Room Air Pain: Pain Assessment Pain Score: 0-No pain  See Function Navigator for Current Functional Status.   Therapy/Group: Individual Therapy  [name] [name] 11/26/2017, 4:24 PM

## 2017-11-26 NOTE — Progress Notes (Signed)
Occupational Therapy Session Note  Patient Details  Name: Chad Franklin MRN: [id number] Date of Birth: 1935-01-12  Today's Date: 11/26/2017 OT Individual Time: 4562-5638 OT Individual Time Calculation (min): 75 min    Short Term Goals: Week 1:  OT Short Term Goal 1 (Week 1): Pt will perform LB dressing with mod A  OT Short Term Goal 2 (Week 1): Pt will perform sit to stands with min A using LRAD OT Short Term Goal 3 (Week 1): Pt will perform 3/3 toileting tasks with mod A  OT Short Term Goal 4 (Week 1): Pt will perform 2 grooming tasks standing at sink with min A   Skilled Therapeutic Interventions/Progress Updates:    Pt received in w/c with spouse in room with him.  Pt declined a shower or bath, but did need to don pants and shoes.  Verbal cues to stay seated to don over feet as pt wanted to stand up right away. Pt donned over feet with guiding A for managing material. Pt stood up with steadying A to don over hips.  Pt donned slip on shoes and adjusted velcro strap with steadying A as he leaned forward.   Pt taken to gym to work on LUE NMR. Pt demonstrated that he has approximately 50% AROM for sh flex, elb flex, finger flex.  Minimal to no finger extension, but does have partial wrist extension.  Table top gravity eliminated towel slides for shoulder, elbow and finger A/AROM for extension movement patterns. Pt then transferred to mat to work on a/arom exercises for LUE with bimanual holds on dowel bar and then hula hoop to facilitate increased sh flex, elb flex and grasp AROM and strength.   Pt worked on sit to stands with steadying A to close S from mat while holding bar in both hands.   Grasp with full A for release of cones in cone stacking activity.   To facilitate finger flexion, used estim on extensors for 8 min at 40 intensity.  Trace muscular response, but pt tolerated estim well.  Pt transferred back to w/c, taken back to room, and then to recliner.  Pt in room with his  spouse.  Therapy Documentation Precautions:  Precautions Precautions: Fall Restrictions Weight Bearing Restrictions: No    Vital Signs: Therapy Vitals Pulse Rate: 94 BP: 130/78 Pain: Pain Assessment Pain Score: 0-No pain ADL:   See Function Navigator for Current Functional Status.   Therapy/Group: Individual Therapy  SAGUIER,JULIA 11/26/2017, 12:36 PM

## 2017-11-26 NOTE — Evaluation (Signed)
Recreational Therapy Assessment and Plan  Patient Details  Name: Chad Franklin MRN: [id number] Date of Birth: 05-Nov-1934 Today's Date: 11/26/2017  Rehab Potential: Good ELOS:   3 weeks  Assessment    Problem List:      Patient Active Problem List   Diagnosis Date Noted  . Ischemic stroke of frontal lobe ([place]) 11/20/2017  . History of depression   . Essential hypertension   . Acute cerebral infarction ([place])   . Chronic atrial fibrillation ([place])   . Acute combined systolic and diastolic congestive heart failure ([place])   . Diabetes mellitus type 2 in nonobese ([facility])   . Atrial fibrillation with rapid ventricular response ([place])   . History of CVA (cerebrovascular accident)   . Hypokalemia   . Acute blood loss anemia   . Acute ischemic stroke ([place]) 11/14/2017  . Diabetes mellitus 10/26/2010  . GERD (gastroesophageal reflux disease) 10/26/2010  . COPD (chronic obstructive pulmonary disease) ([place]) 10/26/2010  . Depression 10/26/2010  . Bilateral swelling of feet 10/26/2010  . Palpitations 10/26/2010    Past Medical History:      Past Medical History:  Diagnosis Date  . Bilateral swelling of feet   . COPD (chronic obstructive pulmonary disease) ([place])   . Depression   . Diabetes mellitus   . GERD (gastroesophageal reflux disease)   . Palpitations   . Stroke [facility])    2   Past Surgical History:       Past Surgical History:  Procedure Laterality Date  . GALLBLADDER SURGERY    . HERNIA REPAIR      Assessment & Plan Clinical Impression: Patient is a 83 year old male with history of COPD, DM?, prior CVA, A fib- on coumadin who was admitted on 11/14/2017 with sudden onset of left-sided weakness. History taken from chart review and wife. CT head neck was negative for large vessel occlusion or flow-limiting stenosis and revealed incidental severe left C3/4 neuroforaminal narrowing. INR subtherapeutic at admission and he received TPA. 2D echo done  showing EF of 35 to 40% with apical and anteroseptal myocardial akinesis. He had sudden onset of vomiting and CT head ordered, reviewed, unremarkable for acute intracranial process. MRI of brain revealed small acute cortical/subcortical infarct and posterior right frontal lobe and remote left parieto-occipital infarct. Cardiology was consulted for input on DO ACS and recommended Myoview for work-up. This revealed EF of 28% with global hypokinesis, no reversible ischemia and fixed intra-lateral wall may represent infarct. He has had issues with A. fib with RVR therefore metoprolol added, Coreg titrated upwards and digoxin added today. Chest x-ray ordered due to SOB and showed bronchitic changes. Albuterol d/c to avoid SE of tachycardia. Therapy ongoing and patient was deficits due to left-sided weakness as well as cognitive deficits. CIR was recommended for progressive therapies.  Patient transferred to CIR on 11/20/2017.   Met with pt & wife to discuss TR services.  Pt with little interest in TR services so full eval deferred.  Will continue to monitor through team for future participation. Plan No further TR as pt has little interest in TR services  Recommendations for other services: None   Discharge Criteria: Patient will be discharged from TR if patient refuses treatment 3 consecutive times without medical reason.  If treatment goals not met, if there is a change in medical status, if patient makes no progress towards goals or if patient is discharged from hospital.  The above assessment, treatment plan, treatment alternatives and goals were discussed and  mutually agreed upon: by patient  [place] 11/26/2017, 3:41 PM

## 2017-11-26 NOTE — Progress Notes (Signed)
Physical Therapy Session Note  Patient Details  Name: Chad Franklin MRN: [id number] Date of Birth: [birth year]  Today's Date: 11/26/2017 PT Individual Time: 1130-1200 and and 1415-1455 PT Individual Time Calculation (min): 30 min and 40 min (total 70 min)  Short Term Goals: Week 1:  PT Short Term Goal 1 (Week 1): Pt will maintain dynamic sitting balance w/ supervision PT Short Term Goal 2 (Week 1): Pt will initiate stair training PT Short Term Goal 3 (Week 1): Pt will ambulate 44' w/ mod assist using LRAD PT Short Term Goal 4 (Week 1): Pt will perform bed<>chair transfers w/ min assist  Skilled Therapeutic Interventions/Progress Updates: Tx 1: Pt received ambulating in room with wife and without AD; therapist completed providing close S; min guard for standing balance while urinating and ambulating out of bathroom. Reinforced recommendation that pt/family call for staff assist for toileting/transfers d/t poor balance; alerted RN. Transported to/from gym totalA for energy conservation. Gait x90' with no AD, min guard, tactile cues/facilitation at L glute med/max for stance control. Side stepping R/L and forward/backward over trekking pole for focus on LE coordination, stepping strategies; progressed to four square step test untimed x2 trials with HHA with +2 min guard on first trial, minA +1 on second trial. Requires max instructional cues for sequencing four square test. Returned to room as above. Stand pivot w/c >recliner with close S. Remained in recliner, chair alarm intact and all needs in reach.   Tx 2: Pt received seated in recliner, no c/o pain and agreeable to treatment. Pt waiting for late lunch tray however reports he ate crackers to raise blood sugar levels. Stand pivot recliner <>w/c with min guard/close S. Transported to gym totalA for energy conservation d/t reported fatigue. LLE lateral step ups at 6" step with BUE support on rail 3 sets of 5-8 reps with min guard, tactile/verbal  cueing for L glute med/max activation. Utilized English as a second language teacher feedback to improve awareness of hip/knee terminal extension in stance with improved performance. Gait through obstacle course in hall with minA overall ambulating over trekking poles, around cones, up/down 4" step; two major LOBs with LLE getting caught behind RLE and unable to correct requiring +2A to recover. Braided walking R/L with modA +2 for focus on LE coordination. Returned to room with R hemi w/c propulsion x150. Stand pivot to recliner with close S. Remained in recliner, alarm intact and all needs in reach.      Therapy Documentation Precautions:  Precautions Precautions: Fall Restrictions Weight Bearing Restrictions: No   See Function Navigator for Current Functional Status.   Therapy/Group: Individual Therapy  Harlon Ditty 11/26/2017, 12:08 PM

## 2017-11-27 ENCOUNTER — Inpatient Hospital Stay (HOSPITAL_COMMUNITY): Payer: Medicare HMO | Admitting: Physical Therapy

## 2017-11-27 ENCOUNTER — Inpatient Hospital Stay (HOSPITAL_COMMUNITY): Payer: Medicare HMO | Admitting: Occupational Therapy

## 2017-11-27 LAB — GLUCOSE, CAPILLARY
GLUCOSE-CAPILLARY: 93 mg/dL (ref 70–99)
Glucose-Capillary: 87 mg/dL (ref 70–99)
Glucose-Capillary: 83 mg/dL (ref 70–99)
Glucose-Capillary: 90 mg/dL (ref 70–99)

## 2017-11-27 NOTE — Progress Notes (Signed)
Social Work Patient ID: Chad Franklin, male   DOB: September 21, 1934, 83 y.o.   MRN: [id number]  Met with pt and wife who is here to discuss team conference goals supervision level and target discharge date 9/18. He was hoping to leave sooner than this. Discussed if reaches goals sooner than can move up discharge date. He will talk with therapy team when in therapy tomorrow.

## 2017-11-27 NOTE — Progress Notes (Signed)
Physical Therapy Session Note  Patient Details  Name: Chad Franklin MRN: [id number] Date of Birth: Oct 13, 1934  Today's Date: 11/27/2017 PT Individual Time: 7782-4235 PT Individual Time Calculation (min): 70 min   Short Term Goals: Week 1:  PT Short Term Goal 1 (Week 1): Pt will maintain dynamic sitting balance w/ supervision PT Short Term Goal 2 (Week 1): Pt will initiate stair training PT Short Term Goal 3 (Week 1): Pt will ambulate 39' w/ mod assist using LRAD PT Short Term Goal 4 (Week 1): Pt will perform bed<>chair transfers w/ min assist  Skilled Therapeutic Interventions/Progress Updates:    Patient received up in chair, very pleasant and willing to participate in PT session today. Spent quite a bit of time today working on long distance ambulation 383f+ with RW and S for endurance training, patient fatigued after each gait period and requiring rest breaks following each distance. Rode Nustep for 10 minutes with B LEs/R UE on level 6 for LE strengthening and further endurance training. Patient able to perform toileting and selfcare with S today. Worked on functional problem solving in standing and building/deconstructing PVC pipes with Mod cues and facilitation for correct build, also worked on lateral trunk leans to L for core strength and weight bearing through L LE. He was able to ambulate with RW from PT gym back to his room with S, and was left up in recliner with all needs met, alarm active, and family present.   Therapy Documentation Precautions:  Precautions Precautions: Fall Restrictions Weight Bearing Restrictions: No General:   Vital Signs:   Pain: Pain Assessment Pain Scale: 0-10 Pain Score: 0-No pain   See Function Navigator for Current Functional Status.   Therapy/Group: Individual Therapy   KDeniece ReePT, DPT, CBIS  Supplemental Physical Therapist [facility]   Pager [phone] Rehab Office [phone]   11/27/2017, 12:51 PM

## 2017-11-27 NOTE — Progress Notes (Signed)
Physical Therapy Session Note  Patient Details  Name: Chad Franklin MRN: [id number] Date of Birth: 09/25/1934  Today's Date: 11/27/2017 PT Individual Time: 0800-0900 PT Individual Time Calculation (min): 60 min   Short Term Goals: Week 1:  PT Short Term Goal 1 (Week 1): Pt will maintain dynamic sitting balance w/ supervision PT Short Term Goal 2 (Week 1): Pt will initiate stair training PT Short Term Goal 3 (Week 1): Pt will ambulate 53' w/ mod assist using LRAD PT Short Term Goal 4 (Week 1): Pt will perform bed<>chair transfers w/ min assist  Skilled Therapeutic Interventions/Progress Updates: Pt received in recliner with wife present; denies pain and agreeable to treatment. Shoes donned totalA for time management. Gait to gym with no AD, minA with tactile cues/facilitation at L glute med/max for stance control. Performed car transfer with min guard. Gait on ramp min guard. Gait over mulch surface and curb with modA d/t LOB when transitioning from regular flooring to mulch, and caught L toe on curb when attempting to step up curb without holding rail. Performed couch, bed and recliner transfers with S/min guard in ADL apartment; gait on carpeted surface with min guard. Standing toe taps to 3" and 6" step with min guard x20 reps each. Standing floor clock with multidirectional stepping; moderate LOBs with L lateral and backwards stepping requiring modA to recover and prevent fall. Standing on airex pad, performed dynamic RUE reaching outside BOS to retrieve and match playing cards to board for focus on ankle strategy. Modified plantigrade performed on mat table with tactile facilitation at LUE for elbow control while performing RUE stepping and clothespin task for forced use and WB through LUE. Returned to room with gait no AD, LOB when attempting to talk to another staff member in the hallway d/t cognitive dual task. Remained in recliner at end of session, chair alarm intact and all needs in reach.       Therapy Documentation Precautions:  Precautions Precautions: Fall Restrictions Weight Bearing Restrictions: No   See Function Navigator for Current Functional Status.   Therapy/Group: Individual Therapy  Harlon Ditty 11/27/2017, 8:55 AM

## 2017-11-27 NOTE — Patient Care Conference (Signed)
Inpatient RehabilitationTeam Conference and Plan of Care Update Date: 11/27/2017   Time: 2:10 PM    Patient Name: Chad Franklin      Medical Record Number: [id number]  Date of Birth: [birth year] Sex: Male         Room/Bed: 4M10C/4M10C-01 Payor Info: Payor: [employer] MEDICARE / Plan: [employer] MEDICARE HMO / Product Type: *No Product type* /    Admitting Diagnosis: CVA  Admit Date/Time:  11/20/2017  6:29 PM Admission Comments: No comment available   Primary Diagnosis:  <principal problem not specified> Principal Problem: <principal problem not specified>  Patient Active Problem List   Diagnosis Date Noted  . Hypoalbuminemia due to protein-calorie malnutrition (HCC)   . Sleep disturbance   . Ischemic stroke of frontal lobe (HCC) 11/20/2017  . History of depression   . Essential hypertension   . Acute cerebral infarction (HCC)   . Chronic atrial fibrillation (HCC)   . Acute combined systolic and diastolic congestive heart failure (HCC)   . Diabetes mellitus type 2 in nonobese (HCC)   . Atrial fibrillation with rapid ventricular response (HCC)   . History of CVA (cerebrovascular accident)   . Hypokalemia   . Acute blood loss anemia   . Acute ischemic stroke (HCC) 11/14/2017  . Diabetes mellitus 10/26/2010  . GERD (gastroesophageal reflux disease) 10/26/2010  . COPD (chronic obstructive pulmonary disease) (HCC) 10/26/2010  . Depression 10/26/2010  . Bilateral swelling of feet 10/26/2010  . Palpitations 10/26/2010    Expected Discharge Date: Expected Discharge Date: 12/04/17  Team Members Present: Physician leading conference: Dr. Maryla Morrow Social Worker Present: Dossie Der, LCSW Nurse Present: Chana Bode, RN PT Present: Alyson Reedy, PT OT Present: Johnsie Cancel, OT PPS Coordinator present : Edson Snowball, PT     Current Status/Progress Goal Weekly Team Focus  Medical   Left-sided weakness as well as cognitive deficits  secondary to small acute cortical/subcortical  infarct and posterior right frontal lobe and remote left parieto-occipital infarct  Improve mobility, safety, transfers, endurance, balance, sleep  See above   Bowel/Bladder   Continent of bladder/bowel, LBM 11/26/2017, remain on stool softener and prn laxative   Maintain continence  Assess toileting needs Q shift and prn , monitor for BM within 2-3 days, provide laxative or supplement , monitor results   Swallow/Nutrition/ Hydration             ADL's   Overall CGA-min A for self care tasks and functional transfers   supervision   hemi technique, transfers, activity tolerance, balance    Mobility   min guard overall with RW, minA with no AD  upgraded to S overall  L NMR, dynamic gait/balance, activity tolerance   Communication             Safety/Cognition/ Behavioral Observations  No reports of falls or injuries,   Min assistance   Assess and document QS and prn    Pain   patient will be free of pain  < 2  QS and prn assessment , medicate for pain as ordered and evaluate effectiveess of medication, notify medical team if no relief   Skin   right groin sie with MASD, cleanse , antifungal powder applied prn monitor, bruising/ skin irritation to extremites notr, barrier cteam   Patient will be free of skin breakdown and infection  Skn Aseess OS and prn,notify medical team of current and new skin issues or concerns       *See Care Plan and progress notes for long  and short-term goals.     Barriers to Discharge  Current Status/Progress Possible Resolutions Date Resolved   Physician    Medical stability     See above  Therapies, optimize sleep meds      Nursing                  PT                    OT                  SLP                SW                Discharge Planning/Teaching Needs:  HOme with wife who can provide 24 hr care, here and has observed pt in therapies. Concerned about [employer] co-pay-both pharmacy and SW working on this      Team Discussion:  Goals  supervision level upgraded goals to overall supervision level. Weaned off O2. Sleeping better and BP and DM managed. Activity tolerance is better and balance issues are improving. Pt wants to go home soon. Wife stays here with him  Revisions to Treatment Plan:  DC 9/18    Continued Need for Acute Rehabilitation Level of Care: The patient requires daily medical management by a physician with specialized training in physical medicine and rehabilitation for the following conditions: Daily direction of a multidisciplinary physical rehabilitation program to ensure safe treatment while eliciting the highest outcome that is of practical value to the patient.: Yes Daily medical management of patient stability for increased activity during participation in an intensive rehabilitation regime.: Yes Daily analysis of laboratory values and/or radiology reports with any subsequent need for medication adjustment of medical intervention for : Neurological problems   I attest that I was present, lead the team conference, and concur with the assessment and plan of the team.   [name] Chris 11/27/2017, 4:15 PM

## 2017-11-27 NOTE — Progress Notes (Signed)
Occupational Therapy Session Note  Patient Details  Name: Chad Franklin MRN: [id number] Date of Birth: 1934-12-01  Today's Date: 11/27/2017 OT Individual Time: 1300-1400 OT Individual Time Calculation (min): 60 min    Short Term Goals: Week 1:  OT Short Term Goal 1 (Week 1): Pt will perform LB dressing with mod A  OT Short Term Goal 2 (Week 1): Pt will perform sit to stands with min A using LRAD OT Short Term Goal 3 (Week 1): Pt will perform 3/3 toileting tasks with mod A  OT Short Term Goal 4 (Week 1): Pt will perform 2 grooming tasks standing at sink with min A   Skilled Therapeutic Interventions/Progress Updates:    Pt sitting in recliner upon entry with no reports of pain and wife present. Pt performed all functional transfers this session with varying CGA-min A for safety and balance with RW. Pt ambulated to bathroom and performed all bathing tasks seated except for periarea care requiring therapist to assist for thoroughness. While seated, therapist facilitated L UE functional movements with HOH assist and facilitation at elbow to bathe R UE. Pt ambulated to EOB and performed dressing tasks while seated EOB with guarding assist required due to pt with decreased safety awareness when leaning over for LB dressing. Education provided to pt regarding sitting unsupported EOB with increased fall risk vs standard chair with back support. Discussed with pt and wife regarding home bathroom setup. Social worker notified of need for TTB and BSC during conference. Pt ambulated to sink and performed grooming tasks and then returned to recliner.   Seated in recliner, pt performed UE ranger exercises to facilitate functional movement patterns for shoulder flexion/extension and shoulder horizontal abduction/adduction with good tolerance with VC's for body positioning and pacing. Pt left in room with all needs in reach, chair alarm set and wife present.   Therapy Documentation Precautions:   Precautions Precautions: Fall Restrictions Weight Bearing Restrictions: No   See Function Navigator for Current Functional Status.   Therapy/Group: Individual Therapy  [name] 11/27/2017, 3:06 PM

## 2017-11-27 NOTE — Plan of Care (Signed)
  Problem: RH Balance Goal: LTG Patient will maintain dynamic standing balance (PT) Description LTG:  Patient will maintain dynamic standing balance with assistance during mobility activities (PT) Flowsheets (Taken 11/27/2017 0751) LTG: Pt will maintain dynamic standing balance during mobility activities with:: Supervision/Verbal cueing (upgraded d/t progress) Note:  Upgraded d/t progress   Problem: RH Car Transfers Goal: LTG Patient will perform car transfers with assist (PT) Description LTG: Patient will perform car transfers with assistance (PT). Flowsheets (Taken 11/27/2017 0751) LTG: Pt will perform car transfers with assist:: Supervision/Verbal cueing (upgraded d/t progress) Note:  Upgraded d/t progress   Problem: RH Ambulation Goal: LTG Patient will ambulate in controlled environment (PT) Description LTG: Patient will ambulate in a controlled environment, # of feet with assistance (PT). Flowsheets (Taken 11/27/2017 0751) LTG: Pt will ambulate in controlled environ  assist needed:: Supervision/Verbal cueing (upgraded d/t progress) LTG: Ambulation distance in controlled environment: 150' LRAD Note:  Upgraded d/t progress   Problem: RH Ambulation Goal: LTG Patient will ambulate in home environment (PT) Description LTG: Patient will ambulate in home environment, # of feet with assistance (PT). Flowsheets (Taken 11/27/2017 0751) LTG: Pt will ambulate in home environ  assist needed:: Supervision/Verbal cueing (upgraded d/t progress) LTG: Ambulation distance in home environment: 150' LRAD Note:  Upgraded d/t progress   Problem: RH Wheelchair Mobility Goal: LTG Patient will propel w/c in home environment (PT) Description LTG: Patient will propel wheelchair in home environment, # of feet with assistance (PT). Outcome: Not Applicable Flowsheets (Taken 11/27/2017 0751) LTG: Pt will propel w/c in home environ  assist needed:: -- (d/c; pt ambulatory)   Problem: RH Stairs Goal: LTG  Patient will ambulate up and down stairs w/assist (PT) Description LTG: Patient will ambulate up and down # of stairs with assistance (PT) Flowsheets (Taken 11/27/2017 0751) LTG: Pt will ambulate up/down stairs assist needed:: Supervision/Verbal cueing (upgraded d/t progress) Note:  Upgraded d/t progress

## 2017-11-27 NOTE — Progress Notes (Signed)
[place] PHYSICAL MEDICINE & REHABILITATION     PROGRESS NOTE  Subjective/Complaints:  Patient seen sitting up in his chair this morning. He states he slept well overnight. He denies complaints.  ROS: denies CP, SOB, nausea, vomiting, diarrhea.  Objective: Vital Signs: Blood pressure 113/79, pulse 92, temperature 98.3 F (36.8 C), temperature source Oral, resp. rate 16, height 5\' 8"  (1.727 m), weight 104.5 kg, SpO2 93 %. No results found. No results for input(s): WBC, HGB, HCT, PLT in the last 72 hours. No results for input(s): NA, K, CL, GLUCOSE, BUN, CREATININE, CALCIUM in the last 72 hours.  Invalid input(s): CO CBG (last 3)  Recent Labs    11/26/17 1657 11/26/17 2006 11/27/17 0705  GLUCAP 94 112* 93    Wt Readings from Last 3 Encounters:  11/20/17 104.5 kg  11/14/17 104.7 kg    Physical Exam:  BP 113/79 (BP Location: Right Arm)   Pulse 92   Temp 98.3 F (36.8 C) (Oral)   Resp 16   Ht 5\' 8"  (1.727 m)   Wt 104.5 kg   SpO2 93%   BMI 35.03 kg/m  Constitutional: No distress . Vital signs reviewed. HENT: Normocephalic.  Atraumatic. Eyes: EOMI. No discharge. Cardiovascular: irregularly irregular. No JVD. Respiratory: CTA bilaterally. Normal effort. GI: BS +. Non-distended. Musc: No edema or tenderness in extremities. Neurological: He is alert.  HOH Motor: Right upper extremity 5/5 proximal distal Right lower extremity: 4/5 proximal distal Left upper extremity: 3/5 proximal distal with apraxia, stable Left lower extremity: 4/5 proximal to distal, stable Skin: Skin is warm and dry.  Psychiatric: His speech is delayed. He is slowed.    Assessment/Plan: 1. Functional deficits secondary to small acute cortical/subcortical infarct and posterior right frontal lobe and remote left parieto-occipital infarct which require 3+ hours per day of interdisciplinary therapy in a comprehensive inpatient rehab setting. Physiatrist is providing close team supervision and 24  hour management of active medical problems listed below. Physiatrist and rehab team continue to assess barriers to discharge/monitor patient progress toward functional and medical goals.  Function:  Bathing Bathing position Bathing activity did not occur: Refused Position: Shower(seated on BSC in shower )  Bathing parts Body parts bathed by patient: Left arm, Chest, Abdomen, Front perineal area, Right upper leg, Left upper leg Body parts bathed by helper: Right lower leg, Left lower leg, Back, Buttocks, Right arm  Bathing assist Assist Level: 2 helpers      Upper Body Dressing/Undressing Upper body dressing   What is the patient wearing?: Pull over shirt/dress     Pull over shirt/dress - Perfomed by patient: Thread/unthread right sleeve, Put head through opening Pull over shirt/dress - Perfomed by helper: Thread/unthread left sleeve, Pull shirt over trunk        Upper body assist Assist Level: Touching or steadying assistance(Pt > 75%)      Lower Body Dressing/Undressing Lower body dressing   What is the patient wearing?: Pants, Shoes Underwear - Performed by patient: Pull underwear up/down Underwear - Performed by helper: Thread/unthread right underwear leg, Thread/unthread left underwear leg Pants- Performed by patient: Thread/unthread right pants leg, Thread/unthread left pants leg, Pull pants up/down Pants- Performed by helper: Thread/unthread right pants leg, Thread/unthread left pants leg, Pull pants up/down   Non-skid slipper socks- Performed by helper: Don/doff right sock, Don/doff left sock     Shoes - Performed by patient: Don/doff right shoe, Don/doff left shoe, Fasten right, Fasten left  Lower body assist Assist for lower body dressing: Touching or steadying assistance (Pt > 75%)      Toileting Toileting Toileting activity did not occur: No continent bowel/bladder event Toileting steps completed by patient: Adjust clothing prior to toileting,  Performs perineal hygiene, Adjust clothing after toileting Toileting steps completed by helper: Adjust clothing prior to toileting, Adjust clothing after toileting, Performs perineal hygiene Toileting Assistive Devices: Grab bar or rail  Toileting assist Assist level: Supervision or verbal cues   Transfers Chair/bed transfer   Chair/bed transfer method: Ambulatory Chair/bed transfer assist level: Touching or steadying assistance (Pt > 75%) Chair/bed transfer assistive device: Armrests     Locomotion Ambulation     Max distance: 150' Assist level: Touching or steadying assistance (Pt > 75%)   Wheelchair   Type: Manual Max wheelchair distance: 20 Assist Level: Supervision or verbal cues  Cognition Comprehension Comprehension assist level: Follows basic conversation/direction with extra time/assistive device  Expression Expression assist level: Expresses basic needs/ideas: With no assist  Social Interaction Social Interaction assist level: Interacts appropriately 90% of the time - Needs monitoring or encouragement for participation or interaction.  Problem Solving Problem solving assist level: Solves basic 90% of the time/requires cueing < 10% of the time  Memory Memory assist level: Recognizes or recalls 90% of the time/requires cueing < 10% of the time    Medical Problem List and Plan: 1.  Left-sided weakness as well as cognitive deficits  secondary to small acute cortical/subcortical infarct and posterior right frontal lobe and remote left parieto-occipital infarct.  Cont CIR 2.  DVT Prophylaxis/Anticoagulation: Pharmaceutical: Other (comment)--Apixaban.  3. Pain Management: N/A 4. Mood: LCSW to follow for evaluation and support.  5. Neuropsych: This patient is not fully capable of making decisions on his own behalf. 6. Skin/Wound Care: Routine pressure relief measures.  7. Fluids/Electrolytes/Nutrition: Monitor I/O.   BMP within acceptable range on 9/5  Labs ordered for  tomorrow 8. HTN: Monitor BP bid. On coreg, Cozaar and Lasix.   Controlled on 9/11  Appreciate cardiac recs 9. T2DM?: Has been off medications X 1 year. Hgb A1C- 5.8.    Relatively controlled on 9/11 10. COPD: SOB treated with ipratropium and Xopenex. Cough resolving. Will monitor with increase in activity.     Weaned supplemental oxygen 11. A fib with RVR: Monitor HR bid--continue coreg, digoxin and apixaban.  12.  H/o depression: managed with paxil  13. Sleep disturbance:  Melatonin started on 9/5, increased on 9/9  Improving 14. Acute blood loss anemia  Hemoglobin 12.5 on 9/5  Labs ordered for tomorrow  Continue to monitor 15. Hypoalbuminemia  Supplement initiated on 9/9  LOS (Days) 7 A FACE TO FACE EVALUATION WAS PERFORMED  Chad Franklin Karis Juba 11/27/2017 9:04 AM

## 2017-11-28 ENCOUNTER — Ambulatory Visit (HOSPITAL_COMMUNITY): Payer: Medicare HMO | Admitting: Occupational Therapy

## 2017-11-28 ENCOUNTER — Inpatient Hospital Stay (HOSPITAL_COMMUNITY): Payer: Medicare HMO | Admitting: Occupational Therapy

## 2017-11-28 ENCOUNTER — Inpatient Hospital Stay (HOSPITAL_COMMUNITY): Payer: Medicare HMO

## 2017-11-28 ENCOUNTER — Inpatient Hospital Stay (HOSPITAL_COMMUNITY): Payer: Medicare HMO | Admitting: Physical Therapy

## 2017-11-28 DIAGNOSIS — I10 Essential (primary) hypertension: Secondary | ICD-10-CM

## 2017-11-28 LAB — BASIC METABOLIC PANEL
Anion gap: 7 (ref 5–15)
BUN: 21 mg/dL (ref 8–23)
CO2: 27 mmol/L (ref 22–32)
CREATININE: 0.8 mg/dL (ref 0.61–1.24)
Calcium: 8.5 mg/dL — ABNORMAL LOW (ref 8.9–10.3)
Chloride: 104 mmol/L (ref 98–111)
GFR calc Af Amer: >60 mL/min (ref >60)
GFR calc non Af Amer: >60 mL/min (ref >60)
Glucose, Bld: 96 mg/dL (ref 70–99)
Potassium: 4.6 mmol/L (ref 3.5–5.1)
Sodium: 138 mmol/L (ref 135–145)

## 2017-11-28 LAB — CBC WITH DIFFERENTIAL/PLATELET
Abs Immature Granulocytes: 0.1 10*3/uL (ref 0.0–0.1)
Basophils Absolute: 0 10*3/uL (ref 0.0–0.1)
Basophils Relative: 0 %
EOS ABS: 0.3 10*3/uL (ref 0.0–0.7)
EOS PCT: 4 %
HEMATOCRIT: 41.9 % (ref 39.0–52.0)
HEMOGLOBIN: 12.8 g/dL — AB (ref 13.0–17.0)
IMMATURE GRANULOCYTES: 1 %
LYMPHS ABS: 1 10*3/uL (ref 0.7–4.0)
LYMPHS PCT: 13 %
MCH: 28.6 pg (ref 26.0–34.0)
MCHC: 30.5 g/dL (ref 30.0–36.0)
MCV: 93.7 fL (ref 78.0–100.0)
MONOS PCT: 10 %
Monocytes Absolute: 0.8 10*3/uL (ref 0.1–1.0)
NEUTROS PCT: 72 %
Neutro Abs: 5.5 10*3/uL (ref 1.7–7.7)
Platelets: 312 10*3/uL (ref 150–400)
RBC: 4.47 MIL/uL (ref 4.22–5.81)
RDW: 13.5 % (ref 11.5–15.5)
WBC: 7.7 10*3/uL (ref 4.0–10.5)

## 2017-11-28 LAB — GLUCOSE, CAPILLARY
GLUCOSE-CAPILLARY: 95 mg/dL (ref 70–99)
GLUCOSE-CAPILLARY: 133 mg/dL — AB (ref 70–99)
GLUCOSE-CAPILLARY: 129 mg/dL — AB (ref 70–99)
Glucose-Capillary: 117 mg/dL — ABNORMAL HIGH (ref 70–99)

## 2017-11-28 NOTE — Progress Notes (Signed)
[place] PHYSICAL MEDICINE & REHABILITATION     PROGRESS NOTE  Subjective/Complaints:  Pt seen sitting up in his chair this AM.  He states he did not sleep well overnight.    ROS: Denies CP, SOB, nausea, vomiting, diarrhea.  Objective: Vital Signs: Blood pressure 119/81, pulse 83, temperature 98.2 F (36.8 C), temperature source Oral, resp. rate 17, height 5\' 8"  (1.727 m), weight 104.5 kg, SpO2 97 %. No results found. Recent Labs    11/28/17 0542  WBC 7.7  HGB 12.8*  HCT 41.9  PLT 312   Recent Labs    11/28/17 0542  NA 138  K 4.6  CL 104  GLUCOSE 96  BUN 21  CREATININE 0.80  CALCIUM 8.5*   CBG (last 3)  Recent Labs    11/27/17 1654 11/27/17 2128 11/28/17 0712  GLUCAP 83 90 95    Wt Readings from Last 3 Encounters:  11/20/17 104.5 kg  11/14/17 104.7 kg    Physical Exam:  BP 119/81 (BP Location: Right Arm)   Pulse 83   Temp 98.2 F (36.8 C) (Oral)   Resp 17   Ht 5\' 8"  (1.727 m)   Wt 104.5 kg   SpO2 97%   BMI 35.03 kg/m  Constitutional: No distress . Vital signs reviewed. HENT: Normocephalic.  Atraumatic. Eyes: EOMI. No discharge. Cardiovascular: Irregularly irregular. No JVD. Respiratory: CTA bilaterally. Normal effort. GI: BS +. Non-distended. Musc: No edema or tenderness in extremities. Neurological: He is alert.  HOH Motor: Right upper extremity 5/5 proximal distal Right lower extremity: 4/5 proximal distal Left upper extremity: 4/5 proximal distal with apraxia Left lower extremity: 4/5 proximal to distal, unchanged Skin: Skin is warm and dry.  Psychiatric: His speech is delayed. He is slowed.    Assessment/Plan: 1. Functional deficits secondary to small acute cortical/subcortical infarct and posterior right frontal lobe and remote left parieto-occipital infarct which require 3+ hours per day of interdisciplinary therapy in a comprehensive inpatient rehab setting. Physiatrist is providing close team supervision and 24 hour management of  active medical problems listed below. Physiatrist and rehab team continue to assess barriers to discharge/monitor patient progress toward functional and medical goals.  Function:  Bathing Bathing position Bathing activity did not occur: Refused Position: Shower  Bathing parts Body parts bathed by patient: Left arm, Chest, Abdomen, Front perineal area, Right upper leg, Left upper leg, Buttocks Body parts bathed by helper: Right lower leg, Left lower leg, Back, Right arm  Bathing assist Assist Level: 2 helpers      Upper Body Dressing/Undressing Upper body dressing   What is the patient wearing?: Pull over shirt/dress     Pull over shirt/dress - Perfomed by patient: Thread/unthread right sleeve, Put head through opening, Pull shirt over trunk Pull over shirt/dress - Perfomed by helper: Thread/unthread left sleeve        Upper body assist Assist Level: Touching or steadying assistance(Pt > 75%)      Lower Body Dressing/Undressing Lower body dressing   What is the patient wearing?: Pants, Shoes, Socks Underwear - Performed by patient: Thread/unthread right underwear leg, Thread/unthread left underwear leg, Pull underwear up/down Underwear - Performed by helper: Thread/unthread right underwear leg, Thread/unthread left underwear leg Pants- Performed by patient: Thread/unthread right pants leg, Thread/unthread left pants leg, Pull pants up/down Pants- Performed by helper: Thread/unthread right pants leg, Thread/unthread left pants leg, Pull pants up/down   Non-skid slipper socks- Performed by helper: Don/doff right sock, Don/doff left sock   Socks - Performed  by helper: Don/doff right sock, Don/doff left sock Shoes - Performed by patient: Don/doff left shoe, Don/doff right shoe            Lower body assist Assist for lower body dressing: Touching or steadying assistance (Pt > 75%)      Toileting Toileting Toileting activity did not occur: No continent bowel/bladder  event Toileting steps completed by patient: Adjust clothing prior to toileting, Performs perineal hygiene, Adjust clothing after toileting Toileting steps completed by helper: Adjust clothing prior to toileting, Adjust clothing after toileting, Performs perineal hygiene Toileting Assistive Devices: Grab bar or rail  Toileting assist Assist level: Supervision or verbal cues   Transfers Chair/bed transfer   Chair/bed transfer method: Ambulatory Chair/bed transfer assist level: Supervision or verbal cues Chair/bed transfer assistive device: Environmental consultant, Designer, fashion/clothing     Max distance: 300 Assist level: Supervision or verbal cues   Wheelchair   Type: Manual Max wheelchair distance: 20 Assist Level: Supervision or verbal cues  Cognition Comprehension Comprehension assist level: Follows basic conversation/direction with no assist  Expression Expression assist level: Expresses basic needs/ideas: With no assist  Social Interaction Social Interaction assist level: Interacts appropriately 90% of the time - Needs monitoring or encouragement for participation or interaction.  Problem Solving Problem solving assist level: Solves basic 90% of the time/requires cueing < 10% of the time  Memory Memory assist level: Recognizes or recalls 90% of the time/requires cueing < 10% of the time    Medical Problem List and Plan: 1.  Left-sided weakness as well as cognitive deficits  secondary to small acute cortical/subcortical infarct and posterior right frontal lobe and remote left parieto-occipital infarct.  Cont CIR 2.  DVT Prophylaxis/Anticoagulation: Pharmaceutical: Other (comment)--Apixaban.  3. Pain Management: N/A 4. Mood: LCSW to follow for evaluation and support.  5. Neuropsych: This patient is not fully capable of making decisions on his own behalf. 6. Skin/Wound Care: Routine pressure relief measures.  7. Fluids/Electrolytes/Nutrition: Monitor I/O.   BMP within acceptable  range on 9/12 8. HTN: Monitor BP bid. On coreg, Cozaar and Lasix.   Controlled on 9/11  Appreciate cardiac recs 9. T2DM?: Has been off medications X 1 year. Hgb A1C- 5.8.    Relatively controlled on 9/12 10. COPD: SOB treated with ipratropium and Xopenex. Cough resolving. Will monitor with increase in activity.     Weaned supplemental oxygen 11. A fib with RVR: Monitor HR bid--continue coreg, digoxin and apixaban.  12.  H/o depression: managed with paxil  13. Sleep disturbance:  Melatonin started on 9/5, increased on 9/9  Improving overall 14. Acute blood loss anemia  Hemoglobin 12.8 on 9/12  Continue to monitor 15. Hypoalbuminemia  Supplement initiated on 9/9 16. Morbid obesity  Encourage weight loss  LOS (Days) 8 A FACE TO FACE EVALUATION WAS PERFORMED  Yaris Ferrell Karis Juba 11/28/2017 9:41 AM

## 2017-11-28 NOTE — Progress Notes (Signed)
Physical Therapy Weekly Progress Note  Patient Details  Name: Chad Franklin MRN: [id number] Date of Birth: Sep 06, 1934  Beginning of progress report period: November 21, 2017 End of progress report period: November 28, 2017  Today's Date: 11/28/2017 PT Individual Time: 1500-1530 PT Individual Time Calculation (min): 30 min   Patient has met 4 of 4 short term goals.  Patient currently requires S for bed mobility, min guard for transfers, min guard to minA for gait with RW and minA for stairs. Continues to be limited by L hemiparesis, mild impulsivity and poor awareness of deficits however improving overall. Pt's wife present with patient most days however has not actively particiapted in therapy sessions. Will require formal family education prior to d/c to prepare for S/minA level goals.   Patient continues to demonstrate the following deficits muscle weakness, decreased cardiorespiratoy endurance, impaired timing and sequencing, abnormal tone, unbalanced muscle activation and decreased coordination, decreased attention to left, decreased awareness, decreased problem solving, decreased safety awareness, decreased memory and delayed processing and decreased standing balance, decreased postural control, hemiplegia and decreased balance strategies and therefore will continue to benefit from skilled PT intervention to increase functional independence with mobility.  Patient progressing toward long term goals..  Plan of care revisions: goals upgraded to S overall with LRAD.  PT Short Term Goals Week 1:  PT Short Term Goal 1 (Week 1): Pt will maintain dynamic sitting balance w/ supervision PT Short Term Goal 1 - Progress (Week 1): Met PT Short Term Goal 2 (Week 1): Pt will initiate stair training PT Short Term Goal 2 - Progress (Week 1): Met PT Short Term Goal 3 (Week 1): Pt will ambulate 21' w/ mod assist using LRAD PT Short Term Goal 3 - Progress (Week 1): Met PT Short Term Goal 4 (Week 1): Pt  will perform bed<>chair transfers w/ min assist PT Short Term Goal 4 - Progress (Week 1): Met Week 2:  PT Short Term Goal 1 (Week 2): =LTG due to estimated LOS, upgraded to S overall, minA stairs for home entry  Skilled Therapeutic Interventions/Progress Updates: Pt received seated in recliner, denies pain and agreeable to treatment. Gait in/out of bathroom with RW and min guard; S for standing balance while urinating. Gait to gym with RW and min guard; min cues for LLE attention and foot clearance. Standing balance on small red wedge under forefoot for passive gastroc lengthening while performing dynamic RUE reaching outside BOS to retrieve/match cards to board, min guard overall. Ascent/descent 4 steps x2 trials with B handrails and min guard; reciprocal ascent, step-to descent with cues for correct lead LE for safety. On second trial at top of steps LLE buckled and required modA to recover, pt reports "it just gave way" and was the first time that has happened. Gait back to room with RW and min guard. Remained in recliner at end of session, chair alarm intact and all needs in reach.      Therapy Documentation Precautions:  Precautions Precautions: Fall Restrictions Weight Bearing Restrictions: No Pain: Pain Assessment Pain Score: 0-No pain   See Function Navigator for Current Functional Status.  Therapy/Group: Individual Therapy  Corliss Skains 11/28/2017, 3:25 PM

## 2017-11-28 NOTE — Progress Notes (Signed)
Occupational Therapy Session Note  Patient Details  Name: Chad Franklin MRN: [id number] Date of Birth: 14-Jun-1934  Today's Date: 11/28/2017 OT Individual Time: 1400-1430 OT Individual Time Calculation (min): 30 min    Short Term Goals: Week 1:  OT Short Term Goal 1 (Week 1): Pt will perform LB dressing with mod A  OT Short Term Goal 2 (Week 1): Pt will perform sit to stands with min A using LRAD OT Short Term Goal 3 (Week 1): Pt will perform 3/3 toileting tasks with mod A  OT Short Term Goal 4 (Week 1): Pt will perform 2 grooming tasks standing at sink with min A   Skilled Therapeutic Interventions/Progress Updates:    OT intervention with focus on functional transfers, LUE NMR, and LUE therapeutic tasks to increase LUE function and independence with BADLs.  Pt performed all stand pivot tranfsers at supervision level without AD. Pt engaged in LUE NMR including weight bearing through LUE and L elbow followed by facilitated shoulder flexion/extension, elbow flexion/extension, and grasp/release. Pt with shoulder flexion to approx 90 with compensatory recruitment of trunk.  Pt engaged in shoulder flexion tasks with hand resting on board with increasing incline to increase resistance.  Focus on isolated movements.  Pt with weak grasp and trace release.  Pt returned to room and transferred to recliner with chair alarm activated.   Therapy Documentation Precautions:  Precautions Precautions: Fall Restrictions Weight Bearing Restrictions: No Pain: Pain Assessment Pain Score: 0-No pain   See Function Navigator for Current Functional Status.   Therapy/Group: Individual Therapy  Rich Brave 11/28/2017, 3:35 PM

## 2017-11-28 NOTE — Progress Notes (Signed)
Occupational Therapy Session Note  Patient Details  Name: Chad Franklin MRN: [id number] Date of Birth: [birth year]  Today's Date: 11/28/2017 OT Individual Time: 1000-1100 OT Individual Time Calculation (min): 60 min    Short Term Goals: Week 1:  OT Short Term Goal 1 (Week 1): Pt will perform LB dressing with mod A  OT Short Term Goal 2 (Week 1): Pt will perform sit to stands with min A using LRAD OT Short Term Goal 3 (Week 1): Pt will perform 3/3 toileting tasks with mod A  OT Short Term Goal 4 (Week 1): Pt will perform 2 grooming tasks standing at sink with min A   Skilled Therapeutic Interventions/Progress Updates:    Pt sitting in recliner upon entry reporting that he did not sleep last night, "I don't know why, I just couldn't" and no reports of pain. Pt performed all functional transfers this session with varying CGA-min A with RW and VC's required for safety and pacing. Pt ambulated to w/c and transferred to apartment total A for energy conservation and time management. Therapist demonstrated tub/shower transfer with pt able to return demonstrate transfer with good technique and clearance of BLE's over tub ledge. Pt returned to w/c for rest break, then transferred to EOB with pt performing x2 bed mobility trials with close supervision. Education provided for sit > sidelying > supine vs sit > supine with pt trialing both methods, however despite max VC's pt with difficulty sequencing steps of transfers resulting in same results both trials. Pt transferred to EOB, then ambulated to therapy gym and sat EOM.   While sitting EOM, pt participated in weight bearing activities with L hand positioned on mat and then red spiked bounce ball for increased sensory input. Pt reaching for cones in front, laterally and across midline to facilitate increased weight bearing with good tolerance, however pt required facilitation of extension at elbow for maximum potential. Pt ambulated halfway back to room with  RW and CGA-min A with mod VC's for pacing and RW positioning with HOH assist for repositioning of L hand on RW throughout.  Pt reporting that he needs to use the bathroom therefore he returned to w/c for remainder of distance. Upon entering bathroom, pt distracted by visitors in room and demonstrated decreased safety awareness by attempting to stand with w/c unlocked and AD not in reach. Once w/c breaks locked, pt transferred to toilet and left in bathroom with instructions to him and family to ring the call ball when he is finished. Nurse tech notified of pt sitting on toilet.   Therapy Documentation Precautions:  Precautions Precautions: Fall Restrictions Weight Bearing Restrictions: No  See Function Navigator for Current Functional Status.   Therapy/Group: Individual Therapy  Salvatore Poe 11/28/2017, 12:30 PM

## 2017-11-28 NOTE — Progress Notes (Signed)
Physical Therapy Session Note  Patient Details  Name: Chad Franklin MRN: [id number] Date of Birth: 06-16-1934  Today's Date: 11/28/2017 PT Individual Time: 1300-1330 PT Individual Time Calculation (min): 30 min   Short Term Goals: Week 2:     Skilled Therapeutic Interventions/Progress Updates:    c/o pain in LLQ, not rated, RN notified and monitored throughout session.  Session focus on high level balance with gait without AD, and NMR for postural control and L stance control during gait.    Pt ambulates to therapy gym without AD and min guard.  High level gait through obstacle course with min assist for balance, cues for attention to obstacles and safety.  Side stepping L and R x15' with rail focus on upright posture and stance control in LLE.  NMR for trunk rotation propped on L elbow reaching across midline with RUE for cups.  Returned to room at end of session with RW and supervision.  Positioned in recliner with chair alarm intact, call bell in reach and needs met.   Therapy Documentation Precautions:  Precautions Precautions: Fall Restrictions Weight Bearing Restrictions: No   See Function Navigator for Current Functional Status.   Therapy/Group: Individual Therapy  Michel Santee 11/28/2017, 1:58 PM

## 2017-11-28 NOTE — Progress Notes (Signed)
Physical Therapy Session Note  Patient Details  Name: Chad Franklin MRN: [id number] Date of Birth: November 07, 1934  Today's Date: 11/28/2017 PT Individual Time: 2094-7096 PT Individual Time Calculation (min): 45 min   Short Term Goals: Week 1:  PT Short Term Goal 1 (Week 1): Pt will maintain dynamic sitting balance w/ supervision PT Short Term Goal 2 (Week 1): Pt will initiate stair training PT Short Term Goal 3 (Week 1): Pt will ambulate 55' w/ mod assist using LRAD PT Short Term Goal 4 (Week 1): Pt will perform bed<>chair transfers w/ min assist  Skilled Therapeutic Interventions/Progress Updates:    Pt in recliner upon arrival. Reports that he didn't sleep at all last night. Nothing wrong, just couldn't sleep. Transfers: Sit<>stand CGA-min assist from chair and mat table. Gait: amb 175 ft X2 plus multiple shorter distances with focus on LLE control. Pt having difficulty with LLE foot clearance with swing phase. Multimodal cues provided for dorsiflexion/knee/hip flexion to improve. NRE: standing balance in progressively challenging positions. Heel taps on step, and floor targets. Incorporating head movements for increased challenge and for environmental scanning. Pt with difficulty motor planning and with sequencing activities. Pt returned to room after session, up in chair with call light and chair alarm on.   Therapy Documentation Precautions:  Precautions Precautions: Fall Restrictions Weight Bearing Restrictions: No    Pain:  Denies pain  See Function Navigator for Current Functional Status.   Therapy/Group: Individual Therapy  Delton See, PT 11/28/2017, 10:59 AM

## 2017-11-29 ENCOUNTER — Inpatient Hospital Stay (HOSPITAL_COMMUNITY): Payer: Medicare HMO | Admitting: Occupational Therapy

## 2017-11-29 ENCOUNTER — Inpatient Hospital Stay (HOSPITAL_COMMUNITY): Payer: Medicare HMO | Admitting: Physical Therapy

## 2017-11-29 DIAGNOSIS — E669 Obesity, unspecified: Secondary | ICD-10-CM

## 2017-11-29 DIAGNOSIS — E1169 Type 2 diabetes mellitus with other specified complication: Secondary | ICD-10-CM

## 2017-11-29 LAB — GLUCOSE, CAPILLARY
GLUCOSE-CAPILLARY: 89 mg/dL (ref 70–99)
Glucose-Capillary: 105 mg/dL — ABNORMAL HIGH (ref 70–99)
Glucose-Capillary: 84 mg/dL (ref 70–99)
Glucose-Capillary: 122 mg/dL — ABNORMAL HIGH (ref 70–99)

## 2017-11-29 NOTE — Progress Notes (Signed)
[facility]     PROGRESS NOTE  Subjective/Complaints:  Pt seen sitting up at the EOB this AM.  He states he slept well overnight.    ROS: Denies CP, SOB, nausea, vomiting, diarrhea.  Objective: Vital Signs: Blood pressure 112/73, pulse 82, temperature 98.4 F (36.9 C), temperature source Oral, resp. rate 16, height 5\' 8"  (1.727 m), weight 104.5 kg, SpO2 95 %. No results found. Recent Labs    11/28/17 0542  WBC 7.7  HGB 12.8*  HCT 41.9  PLT 312   Recent Labs    11/28/17 0542  NA 138  K 4.6  CL 104  GLUCOSE 96  BUN 21  CREATININE 0.80  CALCIUM 8.5*   CBG (last 3)  Recent Labs    11/28/17 1703 11/28/17 2104 11/29/17 0706  GLUCAP 129* 117* 105*    Wt Readings from Last 3 Encounters:  11/20/17 104.5 kg  11/14/17 104.7 kg    Physical Exam:  BP 112/73 (BP Location: Right Arm)   Pulse 82   Temp 98.4 F (36.9 C) (Oral)   Resp 16   Ht 5\' 8"  (1.727 m)   Wt 104.5 kg   SpO2 95%   BMI 35.03 kg/m  Constitutional: No distress . Vital signs reviewed. HENT: Normocephalic.  Atraumatic. Eyes: EOMI. No discharge. Cardiovascular: Irregularly irregular. No JVD. Respiratory: CTA bilaterally. Normal effort. GI: BS +. Non-distended. Musc: No edema or tenderness in extremities. Neurological: He is alert.  HOH Motor: Right upper extremity 5/5 proximal distal Right lower extremity: 4/5 proximal distal Left upper extremity: 4-4+/5 shoulder abduction, elbow flexion/extension, 3+/5 hand grip Left lower extremity: 4/5 proximal to distal, unchanged Skin: Skin is warm and dry.  Psychiatric: His speech is delayed. He is slowed.    Assessment/Plan: 1. Functional deficits secondary to small acute cortical/subcortical infarct and posterior right frontal lobe and remote left parieto-occipital infarct which require 3+ hours per day of interdisciplinary therapy in a comprehensive inpatient rehab setting. Physiatrist is providing close team  supervision and 24 hour management of active medical problems listed below. Physiatrist and rehab team continue to assess barriers to discharge/monitor patient progress toward functional and medical goals.  Function:  Bathing Bathing position Bathing activity did not occur: Refused Position: Systems developer parts bathed by patient: Left arm, Chest, Abdomen, Front perineal area, Right upper leg, Left upper leg, Buttocks Body parts bathed by helper: Right lower leg, Left lower leg, Back, Right arm  Bathing assist Assist Level: 2 helpers      Upper Body Dressing/Undressing Upper body dressing   What is the patient wearing?: Pull over shirt/dress     Pull over shirt/dress - Perfomed by patient: Thread/unthread right sleeve, Put head through opening, Pull shirt over trunk Pull over shirt/dress - Perfomed by helper: Thread/unthread left sleeve        Upper body assist Assist Level: Touching or steadying assistance(Pt > 75%)      Lower Body Dressing/Undressing Lower body dressing   What is the patient wearing?: Pants, Shoes, Socks Underwear - Performed by patient: Thread/unthread right underwear leg, Thread/unthread left underwear leg, Pull underwear up/down Underwear - Performed by helper: Thread/unthread right underwear leg, Thread/unthread left underwear leg Pants- Performed by patient: Thread/unthread right pants leg, Thread/unthread left pants leg, Pull pants up/down Pants- Performed by helper: Thread/unthread right pants leg, Thread/unthread left pants leg, Pull pants up/down   Non-skid slipper socks- Performed by helper: Don/doff right sock, Don/doff left sock   Socks -  Performed by helper: Don/doff right sock, Don/doff left sock Shoes - Performed by patient: Don/doff left shoe, Don/doff right shoe            Lower body assist Assist for lower body dressing: Touching or steadying assistance (Pt > 75%)      Toileting Toileting Toileting activity did not occur:  No continent bowel/bladder event Toileting steps completed by patient: Adjust clothing prior to toileting, Performs perineal hygiene, Adjust clothing after toileting Toileting steps completed by helper: Adjust clothing prior to toileting, Adjust clothing after toileting, Performs perineal hygiene Toileting Assistive Devices: Grab bar or rail  Toileting assist Assist level: Supervision or verbal cues   Transfers Chair/bed transfer   Chair/bed transfer method: Ambulatory Chair/bed transfer assist level: Touching or steadying assistance (Pt > 75%) Chair/bed transfer assistive device: Armrests, Patent attorney     Max distance: 150 Assist level: Touching or steadying assistance (Pt > 75%)   Wheelchair   Type: Manual Max wheelchair distance: 20 Assist Level: Supervision or verbal cues  Cognition Comprehension Comprehension assist level: Follows basic conversation/direction with no assist  Expression Expression assist level: Expresses basic needs/ideas: With no assist  Social Interaction Social Interaction assist level: Interacts appropriately 90% of the time - Needs monitoring or encouragement for participation or interaction.  Problem Solving Problem solving assist level: Solves basic 90% of the time/requires cueing < 10% of the time  Memory Memory assist level: Recognizes or recalls 90% of the time/requires cueing < 10% of the time    Medical Problem List and Plan: 1.  Left-sided weakness as well as cognitive deficits  secondary to small acute cortical/subcortical infarct and posterior right frontal lobe and remote left parieto-occipital infarct.  Cont CIR 2.  DVT Prophylaxis/Anticoagulation: Pharmaceutical: Other (comment)--Apixaban.  3. Pain Management: N/A 4. Mood: LCSW to follow for evaluation and support.  5. Neuropsych: This patient is not fully capable of making decisions on his own behalf. 6. Skin/Wound Care: Routine pressure relief measures.  7.  Fluids/Electrolytes/Nutrition: Monitor I/O.   BMP within acceptable range on 9/12 8. HTN: Monitor BP bid. On coreg, Cozaar and Lasix.   Controlled on 9/13  Appreciate cardiac recs 9. T2DM?: Has been off medications X 1 year. Hgb A1C- 5.8.    Relatively controlled on 9/13 10. COPD: SOB treated with ipratropium and Xopenex. Cough resolving. Will monitor with increase in activity.     Weaned supplemental oxygen 11. A fib with RVR: Monitor HR bid--continue coreg, digoxin and apixaban.  12.  H/o depression: managed with paxil  13. Sleep disturbance:  Melatonin started on 9/5, increased on 9/9  Improving overall 14. Acute blood loss anemia  Hemoglobin 12.8 on 9/12  Continue to monitor 15. Hypoalbuminemia  Supplement initiated on 9/9 16. Morbid obesity  Encourage weight loss  LOS (Days) 9 A FACE TO FACE EVALUATION WAS PERFORMED  Angeliah Wisdom Karis Juba 11/29/2017 10:01 AM

## 2017-11-29 NOTE — Progress Notes (Signed)
Physical Therapy Session Note  Patient Details  Name: Chad Franklin MRN: [id number] Date of Birth: [birth year]  Today's Date: 11/29/2017 PT Individual Time: 1300-1415 PT Individual Time Calculation (min): 75 min   Short Term Goals: Week 2:  PT Short Term Goal 1 (Week 2): =LTG due to estimated LOS, upgraded to S overall, minA stairs for home entry  Skilled Therapeutic Interventions/Progress Updates:    Pt received seated in recliner in room, agreeable to PT. No complaints of pain. Wife present during therapy session for hands-on family training with therapy. Pt is min A for transfers throughout therapy session. Demonstrated how to don/doff gait belt and how to assist patient safely with transfers. Ambulation x 150 ft with RW and min A. Gait training with patient's wife to assist pt with gait, pt has one instance of L knee buckling and requires assist of therapist to catch balance. Education with patient's wife that she needs to have a hold of gait belt and stand close to the patient in order to assist him safely in case he loses his balance. Ascend/descend 4 stairs with 2 handrails and mod A. Training with patient's wife on stairs, she requires frequent cueing to stay close to patient and for stance on the stairs when assisting patient. Will continue with patient and family education prior to safe d/c home next week. Ambulation x 180 ft with RW and 180 ft with no AD and min A with focus on turning L and R. Standing tolerance 2 x 7 min while completing LUE fine motor task with hand-over hand assist to complete peg board design and flip cards over. Biodex limits of stability level 1 x 3 trials with BUE support and CGA, 3 trials with no UE support and min A for multidirectional weight shift following visual cues on Biodex. Toilet transfer with min A and RW, pt requires assist with pericare and clothing management. Pt left seated in recliner in room with needs in reach, chair alarm in place, family present at  end of therapy session.  Therapy Documentation Precautions:  Precautions Precautions: Fall Restrictions Weight Bearing Restrictions: No  See Function Navigator for Current Functional Status.   Therapy/Group: Individual Therapy  Peter Congo, PT, DPT  11/29/2017, 3:30 PM

## 2017-11-29 NOTE — Progress Notes (Signed)
Occupational Therapy Weekly Progress Note  Patient Details  Name: Chad Franklin MRN: [id number] Date of Birth: [birth year]  Beginning of progress report period: November 21, 2017 End of progress report period: November 29, 2017  Today's Date: 11/29/2017 OT Individual Time: 2633-3545 OT Individual Time Calculation (min): 75 min    Patient has met 4 of 4 short term goals. Pt has decreased level of assistance needed for self care tasks however has not reached maximal potential at this time. Pt has shown progress in recall of hemi technique and small but noted improvements in pt's ability to use LUE as a stabilizing assist however requires mod VC's throughout sessions for utilizing LUE in tasks and positioning of arm. Pt's wife has been present for majority of sessions however is usually asleep and not an active participant in therapy. Session completed 9/5 with wife educated on assistance needed at discharge, hemi technique and tub shower transfer with pt's wife completing ambulation and functional transfers with pt however further education needs to be completed to ensure carryover and safe return home.   Patient continues to demonstrate the following deficits: muscle weakness, decreased cardiorespiratoy endurance, ataxia, decreased coordination and decreased motor planning, decreased awareness, decreased problem solving, decreased safety awareness and decreased memory and decreased standing balance, hemiplegia and decreased balance strategies and therefore will continue to benefit from skilled OT intervention to enhance overall performance with BADL.  Patient progressing toward long term goals..  Continue plan of care.  OT Short Term Goals Week 1:  OT Short Term Goal 1 (Week 1): Pt will perform LB dressing with mod A  OT Short Term Goal 1 - Progress (Week 1): Met OT Short Term Goal 2 (Week 1): Pt will perform sit to stands with min A using LRAD OT Short Term Goal 2 - Progress (Week 1): Met OT  Short Term Goal 3 (Week 1): Pt will perform 3/3 toileting tasks with mod A  OT Short Term Goal 3 - Progress (Week 1): Met OT Short Term Goal 4 (Week 1): Pt will perform 2 grooming tasks standing at sink with min A  OT Short Term Goal 4 - Progress (Week 1): Met Week 2:  OT Short Term Goal 1 (Week 2): STG = LTG due to LOS   Skilled Therapeutic Interventions/Progress Updates:    Pt supine in bed upon entry with HOB elevated with no reports of pain and pt's wife present. Discussed with pt and his wife regarding level of assistance needed at d/c. Therapist requested for wife to participate in functional transfers and bathing this session with pt and his wife agreeable. Pt performed all functional transfers this session with RW and varying CGA-min A for safety and balance depending on level of seated surface. Pt ambulated to bathroom and performed toileting tasks while seated and buttocks hygiene in standing, with pt attempting to perform but required assistance for thoroughness. Pt ambulated to [facility] in shower and performed all bathing tasks while seated with wife assisting PRN and educated on incorporating LUE into bathing routine. Pt ambulated to chair at sink and performed dressing tasks while seated. Educated wife on providing close supervision and to stand on the L side for pt's safety. Pt returned to w/c and transported to ADL apartment total A for time management.   Pt ambulated with wife into bathroom and demonstrated good recall of previous session regarding tub transfer. Educated pt and his wife on positioning on tub bench, installing mounted grab bars vs suction cup, level of assist  needed in shower, and suggestion for all bathing to be completed seated with verbal comprehension from pt and his wife. Pt ambulated with wife to recliner and performed sit to stand transfer x2 with min A required due to low seated surface. Pt then ambulated back to room with wife providing guarding assist, pt required mod  VC's for pacing, positioning inside of RW and attention to clearing of L foot during ambulation. Pt left in room with all needs in reach, wife present and chair alarm set.   Throughout session, therapist provided all verbal cues for pt despite education provided to wife regarding level of assist needed and LUE weakness with signs to look for during ambulation. Wife continuously stood next to pt not in accessible position for maximized safety, gait belt training would be beneficial for wife to have visual cue for hand placement on pt. Pt's wife participated in therapy session however requires continued family education regarding level of assistance needed at d/c, gait belt training, safety with RW and functional transfers.   Therapy Documentation Precautions:  Precautions Precautions: Fall Restrictions Weight Bearing Restrictions: No   See Function Navigator for Current Functional Status.   Therapy/Group: Individual Therapy  Rojean Ige 11/29/2017, 10:35 AM

## 2017-11-29 NOTE — Progress Notes (Signed)
Occupational Therapy Session Note  Patient Details  Name: Chad Franklin MRN: [id number] Date of Birth: 1934-11-14  Today's Date: 11/29/2017 OT Individual Time: 0930-1030 OT Individual Time Calculation (min): 60 min    Short Term Goals: Week 1:  OT Short Term Goal 1 (Week 1): Pt will perform LB dressing with mod A  OT Short Term Goal 1 - Progress (Week 1): Met OT Short Term Goal 2 (Week 1): Pt will perform sit to stands with min A using LRAD OT Short Term Goal 2 - Progress (Week 1): Met OT Short Term Goal 3 (Week 1): Pt will perform 3/3 toileting tasks with mod A  OT Short Term Goal 3 - Progress (Week 1): Met OT Short Term Goal 4 (Week 1): Pt will perform 2 grooming tasks standing at sink with min A  OT Short Term Goal 4 - Progress (Week 1): Met Week 2:  OT Short Term Goal 1 (Week 2): STG = LTG due to LOS   Skilled Therapeutic Interventions/Progress Updates:    Pt received in recliner and agreeable to therapy to focus on his LUE functional movement.  Pt has increased grasp and elbow flexion but continues with limited finger extension. Sh AROM approximately 50 degrees of flexion.  Pt transferred with stand pivot to w/c and then to therapy mat.  Pt began in sidelying with A/AROM exercises using his hand on walking stick with push pull and arm rotation exercises. Pt maintained grasp well.  He then moved to supine to work on B hands on stick with chest presses and holding stick over chest level.  He then worked on Office manager with extended arm over head to touching his hand to top of his head with min A for proximal control at his shoulder.   Pt then moved into sitting where he worked on grasp and release Musician,  He was then able to pick up basket and place on his lap without A and then rose to stand holding basket with close S. Pt needed A to fully extend fingers to open hand to grasp basket handle but then could hold handle.  Pt repeated this exercise 12x.   B hands of small therapy ball with adduction squeezes followed by elbow flex/ext in small range.  Pt then worked on AROM of shoulder with L forearm on ball and rolling in back and forth.  Pt transferred back to w/c and back to room to use bathroom. His nurse tech then took over as session time was over.  Pt participated extremely well.   Therapy Documentation Precautions:  Precautions Precautions: Fall Restrictions Weight Bearing Restrictions: No    Vital Signs: Therapy Vitals Pulse Rate: 82 BP: 112/73 Pain: Pain Assessment Pain Score: 0-No pain ADL:   See Function Navigator for Current Functional Status.   Therapy/Group: Individual Therapy  [place] 11/29/2017, 12:17 PM

## 2017-11-30 ENCOUNTER — Inpatient Hospital Stay (HOSPITAL_COMMUNITY): Payer: Medicare HMO

## 2017-11-30 LAB — GLUCOSE, CAPILLARY
GLUCOSE-CAPILLARY: 119 mg/dL — AB (ref 70–99)
Glucose-Capillary: 114 mg/dL — ABNORMAL HIGH (ref 70–99)
Glucose-Capillary: 122 mg/dL — ABNORMAL HIGH (ref 70–99)
Glucose-Capillary: 96 mg/dL (ref 70–99)

## 2017-11-30 NOTE — Progress Notes (Signed)
Occupational Therapy Session Note  Patient Details  Name: KARRON ALVIZO MRN: [id number] Date of Birth: 05/23/1934  Today's Date: 11/30/2017 OT Individual Time: 1400-1430 OT Individual Time Calculation (min): 30 min    Short Term Goals: Week 1:  OT Short Term Goal 1 (Week 1): Pt will perform LB dressing with mod A  OT Short Term Goal 1 - Progress (Week 1): Met OT Short Term Goal 2 (Week 1): Pt will perform sit to stands with min A using LRAD OT Short Term Goal 2 - Progress (Week 1): Met OT Short Term Goal 3 (Week 1): Pt will perform 3/3 toileting tasks with mod A  OT Short Term Goal 3 - Progress (Week 1): Met OT Short Term Goal 4 (Week 1): Pt will perform 2 grooming tasks standing at sink with min A  OT Short Term Goal 4 - Progress (Week 1): Met  Skilled Therapeutic Interventions/Progress Updates:    OT intervention with focus on increased LUE function with emphasis on grasp/release to increase independence with BADLs. Pt amb with RW to gym and sat EOM.  Initial tx on weightbearing through LUE at 90 degrees with wrist extended.  Pt practiced grasp/release at different UE positions (pronated/supinated and 90 degrees). Pt with weak extension and full grasp but fatigues quickly after 4-5 repetitions. Pt returned to room and remained in recliner with chair alarm activated.   Therapy Documentation Precautions:  Precautions Precautions: Fall Restrictions Weight Bearing Restrictions: No   Pain:  Pt denies pain  See Function Navigator for Current Functional Status.   Therapy/Group: Individual Therapy  Leroy Libman 11/30/2017, 2:46 PM

## 2017-11-30 NOTE — Progress Notes (Signed)
[place] PHYSICAL MEDICINE & REHABILITATION     PROGRESS NOTE  Subjective/Complaints:  Patient seen sitting up in bed this morning. He states he slept well overnight. He denies complaints.  ROS: Denies CP, SOB, nausea, vomiting, diarrhea.  Objective: Vital Signs: Blood pressure 130/79, pulse 84, temperature 98 F (36.7 C), temperature source Oral, resp. rate 18, height 5\' 8"  (1.727 m), weight 104.5 kg, SpO2 94 %. No results found. Recent Labs    11/28/17 0542  WBC 7.7  HGB 12.8*  HCT 41.9  PLT 312   Recent Labs    11/28/17 0542  NA 138  K 4.6  CL 104  GLUCOSE 96  BUN 21  CREATININE 0.80  CALCIUM 8.5*   CBG (last 3)  Recent Labs    11/29/17 2142 11/30/17 0708 11/30/17 1134  GLUCAP 89 114* 122*    Wt Readings from Last 3 Encounters:  11/20/17 104.5 kg  11/14/17 104.7 kg    Physical Exam:  BP 130/79 (BP Location: Left Arm)   Pulse 84   Temp 98 F (36.7 C) (Oral)   Resp 18   Ht 5\' 8"  (1.727 m)   Wt 104.5 kg   SpO2 94%   BMI 35.03 kg/m  Constitutional: No distress . Vital signs reviewed. HENT: Normocephalic.  Atraumatic. Eyes: EOMI. No discharge. Cardiovascular: irregularly irregular. No JVD. Respiratory: CTA bilaterally. Normal effort. GI: BS +. Non-distended. Musc: No edema or tenderness in extremities. Neurological: He is alert.  HOH Motor: Right upper extremity 5/5 proximal distal Right lower extremity: 4/5 proximal distal Left upper extremity: 4-4+/5 shoulder abduction, elbow flexion/extension, 3+/5 hand grip, stable Left lower extremity: 4/5 proximal to distal, stable Skin: Skin is warm and dry.  Psychiatric: His speech is delayed. He is slowed.    Assessment/Plan: 1. Functional deficits secondary to small acute cortical/subcortical infarct and posterior right frontal lobe and remote left parieto-occipital infarct which require 3+ hours per day of interdisciplinary therapy in a comprehensive inpatient rehab setting. Physiatrist is  providing close team supervision and 24 hour management of active medical problems listed below. Physiatrist and rehab team continue to assess barriers to discharge/monitor patient progress toward functional and medical goals.  Function:  Bathing Bathing position Bathing activity did not occur: Refused Position: Shower  Bathing parts Body parts bathed by patient: Left arm, Chest, Abdomen, Front perineal area, Right upper leg, Left upper leg, Buttocks, Right lower leg, Left lower leg Body parts bathed by helper: Right arm, Back  Bathing assist Assist Level: Touching or steadying assistance(Pt > 75%)      Upper Body Dressing/Undressing Upper body dressing   What is the patient wearing?: Pull over shirt/dress     Pull over shirt/dress - Perfomed by patient: Thread/unthread right sleeve, Put head through opening, Pull shirt over trunk, Thread/unthread left sleeve Pull over shirt/dress - Perfomed by helper: Thread/unthread left sleeve        Upper body assist Assist Level: Supervision or verbal cues      Lower Body Dressing/Undressing Lower body dressing   What is the patient wearing?: Pants, Shoes, Socks Underwear - Performed by patient: Thread/unthread right underwear leg, Thread/unthread left underwear leg, Pull underwear up/down Underwear - Performed by helper: Thread/unthread right underwear leg, Thread/unthread left underwear leg Pants- Performed by patient: Thread/unthread right pants leg, Thread/unthread left pants leg, Pull pants up/down Pants- Performed by helper: Thread/unthread right pants leg, Thread/unthread left pants leg, Pull pants up/down   Non-skid slipper socks- Performed by helper: Don/doff right sock, Don/doff left  sock   Socks - Performed by helper: Don/doff right sock, Don/doff left sock Shoes - Performed by patient: Don/doff left shoe, Don/doff right shoe, Fasten right, Fasten left            Lower body assist Assist for lower body dressing: Touching or  steadying assistance (Pt > 75%)      Toileting Toileting Toileting activity did not occur: No continent bowel/bladder event Toileting steps completed by patient: Adjust clothing prior to toileting, Performs perineal hygiene, Adjust clothing after toileting Toileting steps completed by helper: Performs perineal hygiene Toileting Assistive Devices: Grab bar or rail  Toileting assist Assist level: Touching or steadying assistance (Pt.75%)   Transfers Chair/bed transfer   Chair/bed transfer method: Stand pivot Chair/bed transfer assist level: Touching or steadying assistance (Pt > 75%) Chair/bed transfer assistive device: Armrests, Patent attorney     Max distance: 150' Assist level: Touching or steadying assistance (Pt > 75%)   Wheelchair   Type: Manual Max wheelchair distance: 20 Assist Level: Supervision or verbal cues  Cognition Comprehension Comprehension assist level: Follows basic conversation/direction with extra time/assistive device  Expression Expression assist level: Expresses basic needs/ideas: With extra time/assistive device  Social Interaction Social Interaction assist level: Interacts appropriately 90% of the time - Needs monitoring or encouragement for participation or interaction.  Problem Solving Problem solving assist level: Solves basic problems with no assist  Memory Memory assist level: Recognizes or recalls 90% of the time/requires cueing < 10% of the time    Medical Problem List and Plan: 1.  Left-sided weakness as well as cognitive deficits  secondary to small acute cortical/subcortical infarct and posterior right frontal lobe and remote left parieto-occipital infarct.  Cont CIR 2.  DVT Prophylaxis/Anticoagulation: Pharmaceutical: Other (comment)--Apixaban.  3. Pain Management: N/A 4. Mood: LCSW to follow for evaluation and support.  5. Neuropsych: This patient is not fully capable of making decisions on his own behalf. 6. Skin/Wound  Care: Routine pressure relief measures.  7. Fluids/Electrolytes/Nutrition: Monitor I/O.   BMP within acceptable range on 9/12 8. HTN: Monitor BP bid. On coreg, Cozaar and Lasix.   Controlled on 9/14  Appreciate cardiac recs 9. T2DM?: Has been off medications X 1 year. Hgb A1C- 5.8.    Relatively controlled on 9/14 10. COPD: SOB treated with ipratropium and Xopenex. Cough resolving. Will monitor with increase in activity.     Weaned supplemental oxygen 11. A fib with RVR: Monitor HR bid--continue coreg, digoxin and apixaban.  12.  H/o depression: managed with paxil  13. Sleep disturbance:  Melatonin started on 9/5, increased on 9/9  improved 14. Acute blood loss anemia  Hemoglobin 12.8 on 9/12  Continue to monitor 15. Hypoalbuminemia  Supplement initiated on 9/9 16. Morbid obesity  Encourage weight loss  LOS (Days) 10 A FACE TO FACE EVALUATION WAS PERFORMED  Ankit Karis Juba 11/30/2017 1:21 PM

## 2017-12-01 ENCOUNTER — Inpatient Hospital Stay (HOSPITAL_COMMUNITY): Payer: Medicare HMO

## 2017-12-01 ENCOUNTER — Inpatient Hospital Stay (HOSPITAL_COMMUNITY): Payer: Medicare HMO | Admitting: Physical Therapy

## 2017-12-01 LAB — GLUCOSE, CAPILLARY
GLUCOSE-CAPILLARY: 99 mg/dL (ref 70–99)
GLUCOSE-CAPILLARY: 139 mg/dL — AB (ref 70–99)
GLUCOSE-CAPILLARY: 98 mg/dL (ref 70–99)
Glucose-Capillary: 148 mg/dL — ABNORMAL HIGH (ref 70–99)

## 2017-12-01 NOTE — Progress Notes (Signed)
Physical Therapy Session Note  Patient Details  Name: Chad Franklin MRN: [id number] Date of Birth: 12-Dec-1934  Today's Date: 12/01/2017 PT Individual Time: 0800-0855 PT Individual Time Calculation (min): 55 min   Short Term Goals: Week 2:  PT Short Term Goal 1 (Week 2): =LTG due to estimated LOS, upgraded to S overall, minA stairs for home entry  Skilled Therapeutic Interventions/Progress Updates:   Pt in recliner and agreeable to therapy, denies pain. Min assist to thread pants and don shoes. Ambulated around unit w/ min guard to close supervision w/ RW, emphasis on maintaining adequate L foot clearance as he fatigues w/ gait and navigating community and household environment. Min guard needed at turns and w/ RW management in distracting environments. Pt ambulated 150-250' at a time. Additionally negotiated 4 stairs w/ bilateral handrails, min assist w/ verbal and tactile cues for technique. Performed NuStep 10 min @ level 4 for LE strengthening and aerobic endurance. While on NuStep, discussed community mobility in places he frequents. Educated on building up tolerance to being out of the home for longer periods of time and introducing those activities when both him and his wife feel comfortable. Pt verbalized understanding and in agreement. Worked on stepping strategies and L single leg stance w/o UE support remainder of session. Performed lateral and anterolateral stepping to increasingly farther visual target, w/ both LEs. Performed alternating stepping to 3" step, tactile cues for weight shifting for balance. Min guard to min assist overall for these tasks w/o UE support. Performed Berg Balance Scale, scored 46/56 and explained signficance of results to pt including increased fall risk and importance of RW use at this time. Returned to room and assisted w/ toilet transfer, min guard and verbal reminders for LE garment management. Ended session in recliner, all needs in reach.  Therapy  Documentation Precautions:  Precautions Precautions: Fall Restrictions Weight Bearing Restrictions: No Vital Signs:   See Function Navigator for Current Functional Status.   Therapy/Group: Individual Therapy  Volanda Mangine [name] 12/01/2017, 8:53 AM

## 2017-12-01 NOTE — Progress Notes (Signed)
Occupational Therapy Session Note  Patient Details  Name: JACQUELINE HOLBROOK MRN: [id number] Date of Birth: 04-10-1934  Today's Date: 12/01/2017 OT Individual Time: 6754-4920 OT Individual Time Calculation (min): 40 min    Skilled Therapeutic Interventions/Progress Updates:    1:1. Pt seated in recliner with wife asleep in room. Pt completes stand pivot tranfers with supervision throughout session and no AD. Pt stands at high low table to play large game of checkers while WB into L wrist as pt has to cross midline with RUE to play game. Pt works on Psychologist, prison and probation services with HOH A from OT for digit/thumb extension to [employer]. Exited session with pt seated in recliner, call light in reach and friends present visiting.   Therapy Documentation Precautions:  Precautions Precautions: Fall Restrictions Weight Bearing Restrictions: No General:   Vital Signs: Therapy Vitals Temp: 97.8 F (36.6 C) Temp Source: Oral Pulse Rate: 79 Resp: 16 BP: (!) 139/92 Patient Position (if appropriate): Sitting Oxygen Therapy SpO2: 95 % O2 Device: Room Air Pain:    See Function Navigator for Current Functional Status.   Therapy/Group: Individual Therapy  Shon Hale 12/01/2017, 5:17 PM

## 2017-12-01 NOTE — Progress Notes (Signed)
[place] PHYSICAL MEDICINE & REHABILITATION     PROGRESS NOTE  Subjective/Complaints:  Patient seen sitting up in bed this morning.  He states he slept well overnight.  ROS: Denies CP, SOB, nausea, vomiting, diarrhea.  Objective: Vital Signs: Blood pressure (!) 139/92, pulse 79, temperature 97.8 F (36.6 C), temperature source Oral, resp. rate 16, height 5\' 8"  (1.727 m), weight 104.5 kg, SpO2 95 %. No results found. No results for input(s): WBC, HGB, HCT, PLT in the last 72 hours. No results for input(s): NA, K, CL, GLUCOSE, BUN, CREATININE, CALCIUM in the last 72 hours.  Invalid input(s): CO CBG (last 3)  Recent Labs    11/30/17 2127 12/01/17 0647 12/01/17 1143  GLUCAP 119* 99 139*    Wt Readings from Last 3 Encounters:  11/20/17 104.5 kg  11/14/17 104.7 kg    Physical Exam:  BP (!) 139/92 (BP Location: Right Arm)   Pulse 79   Temp 97.8 F (36.6 C) (Oral)   Resp 16   Ht 5\' 8"  (1.727 m)   Wt 104.5 kg   SpO2 95%   BMI 35.03 kg/m  Constitutional: No distress . Vital signs reviewed. HENT: Normocephalic.  Atraumatic. Eyes: EOMI. No discharge. Cardiovascular: Irregularly irregular. No JVD. Respiratory: CTA bilaterally.  Normal effort. GI: BS +. Non-distended. Musc: No edema or tenderness in extremities. Neurological: He is alert.  HOH Motor: Right upper extremity 5/5 proximal distal Right lower extremity: 4-4+/5 proximal distal Left upper extremity: 4-4+/5 shoulder abduction, elbow flexion/extension, 3+/5 hand grip, unchanged Left lower extremity: 4/5 proximal to distal, unchanged Skin: Skin is warm and dry.  Psychiatric: Normal mood.  Normal behavior.   Assessment/Plan: 1. Functional deficits secondary to small acute cortical/subcortical infarct and posterior right frontal lobe and remote left parieto-occipital infarct which require 3+ hours per day of interdisciplinary therapy in a comprehensive inpatient rehab setting. Physiatrist is providing close team  supervision and 24 hour management of active medical problems listed below. Physiatrist and rehab team continue to assess barriers to discharge/monitor patient progress toward functional and medical goals.  Function:  Bathing Bathing position Bathing activity did not occur: Refused Position: Shower  Bathing parts Body parts bathed by patient: Left arm, Chest, Abdomen, Front perineal area, Right upper leg, Left upper leg, Buttocks, Right lower leg, Left lower leg Body parts bathed by helper: Right arm, Back  Bathing assist Assist Level: Touching or steadying assistance(Pt > 75%)      Upper Body Dressing/Undressing Upper body dressing   What is the patient wearing?: Pull over shirt/dress     Pull over shirt/dress - Perfomed by patient: Thread/unthread right sleeve, Put head through opening, Pull shirt over trunk, Thread/unthread left sleeve Pull over shirt/dress - Perfomed by helper: Thread/unthread left sleeve        Upper body assist Assist Level: Supervision or verbal cues      Lower Body Dressing/Undressing Lower body dressing   What is the patient wearing?: Pants, Shoes, Socks Underwear - Performed by patient: Thread/unthread right underwear leg, Thread/unthread left underwear leg, Pull underwear up/down Underwear - Performed by helper: Thread/unthread right underwear leg, Thread/unthread left underwear leg Pants- Performed by patient: Thread/unthread right pants leg, Thread/unthread left pants leg, Pull pants up/down Pants- Performed by helper: Thread/unthread right pants leg, Thread/unthread left pants leg, Pull pants up/down   Non-skid slipper socks- Performed by helper: Don/doff right sock, Don/doff left sock   Socks - Performed by helper: Don/doff right sock, Don/doff left sock Shoes - Performed by patient: Don/doff  left shoe, Don/doff right shoe, Fasten right, Fasten left            Lower body assist Assist for lower body dressing: Touching or steadying assistance  (Pt > 75%)      Toileting Toileting Toileting activity did not occur: No continent bowel/bladder event Toileting steps completed by patient: Adjust clothing prior to toileting, Adjust clothing after toileting, Performs perineal hygiene Toileting steps completed by helper: Adjust clothing prior to toileting, Performs perineal hygiene, Adjust clothing after toileting Toileting Assistive Devices: Grab bar or rail  Toileting assist Assist level: Touching or steadying assistance (Pt.75%)   Transfers Chair/bed transfer   Chair/bed transfer method: Stand pivot Chair/bed transfer assist level: Supervision or verbal cues Chair/bed transfer assistive device: Armrests, Patent attorney     Max distance: 250' Assist level: Supervision or verbal cues   Wheelchair   Type: Manual Max wheelchair distance: 20 Assist Level: Supervision or verbal cues  Cognition Comprehension Comprehension assist level: Follows basic conversation/direction with extra time/assistive device  Expression Expression assist level: Expresses basic needs/ideas: With extra time/assistive device  Social Interaction Social Interaction assist level: Interacts appropriately 90% of the time - Needs monitoring or encouragement for participation or interaction.  Problem Solving Problem solving assist level: Solves basic problems with no assist  Memory Memory assist level: Recognizes or recalls 90% of the time/requires cueing < 10% of the time    Medical Problem List and Plan: 1.  Left-sided weakness as well as cognitive deficits  secondary to small acute cortical/subcortical infarct and posterior right frontal lobe and remote left parieto-occipital infarct.  Cont CIR 2.  DVT Prophylaxis/Anticoagulation: Pharmaceutical: Other (comment)--Apixaban.  3. Pain Management: N/A 4. Mood: LCSW to follow for evaluation and support.  5. Neuropsych: This patient is not fully capable of making decisions on his own  behalf. 6. Skin/Wound Care: Routine pressure relief measures.  7. Fluids/Electrolytes/Nutrition: Monitor I/O.   BMP within acceptable range on 9/12 8. HTN: Monitor BP bid. On coreg, Cozaar and Lasix.   Controlled on 9/15  Appreciate cardiac recs 9. T2DM?: Has been off medications X 1 year. Hgb A1C- 5.8.    Slightly labile on 9/15 10. COPD: SOB treated with ipratropium and Xopenex. Cough resolving. Will monitor with increase in activity.     Weaned supplemental oxygen 11. A fib with RVR: Monitor HR bid--continue coreg, digoxin and apixaban.  12.  H/o depression: managed with paxil  13. Sleep disturbance:  Melatonin started on 9/5, increased on 9/9  improved 14. Acute blood loss anemia  Hemoglobin 12.8 on 9/12  Continue to monitor 15. Hypoalbuminemia  Supplement initiated on 9/9 16. Morbid obesity  Encourage weight loss  LOS (Days) 11 A FACE TO FACE EVALUATION WAS PERFORMED  Chad Franklin 12/01/2017 3:10 PM

## 2017-12-02 ENCOUNTER — Inpatient Hospital Stay (HOSPITAL_COMMUNITY): Payer: Medicare HMO | Admitting: Occupational Therapy

## 2017-12-02 ENCOUNTER — Inpatient Hospital Stay (HOSPITAL_COMMUNITY): Payer: Medicare HMO | Admitting: Physical Therapy

## 2017-12-02 DIAGNOSIS — R7309 Other abnormal glucose: Secondary | ICD-10-CM

## 2017-12-02 LAB — GLUCOSE, CAPILLARY
GLUCOSE-CAPILLARY: 86 mg/dL (ref 70–99)
Glucose-Capillary: 88 mg/dL (ref 70–99)
Glucose-Capillary: 103 mg/dL — ABNORMAL HIGH (ref 70–99)
Glucose-Capillary: 138 mg/dL — ABNORMAL HIGH (ref 70–99)

## 2017-12-02 NOTE — Progress Notes (Signed)
Occupational Therapy Session Note  Patient Details  Name: Chad Franklin MRN: [id number] Date of Birth: [birth year]  Today's Date: 12/02/2017 OT Individual Time: 1400-1500 OT Individual Time Calculation (min): 60 min    Short Term Goals: Week 2:  OT Short Term Goal 1 (Week 2): STG = LTG due to LOS   Skilled Therapeutic Interventions/Progress Updates:    Pt sitting in recliner upon entry with wife present and no reports of pain. Pt performed all functional transfers this session with varying close supervision - CGA for safety with RW and gait belt. Education provided to pt and his wife regarding recommendation of using gait belt at home with verbal comprehension from both pt and wife. Pt ambulated in hallway to recliner in ADL apartment with wife providing CGA on gait belt for safety. Discussed with pt and his wife regarding purchase of tub transfer bench- social work notified. Pt transferred into tub/shower with wife providing CGA and demonstrated good recall of sequencing of transfer. Pt's wife also maintained L side assistance to pt throughout tranfers compared to previous sessions. Pt transferred off of bench and ambulated to therapy gym with min VC's required for attention to L LUE foot clearance.   Pt transferred to [facility] and performed various table top activities with focus of obtaining and maintaining gross grasp however pt required multiple attempts and HOH at times to obtain positioning. Pt with difficulty extending digits to obtain C shaped positioning to grasp cup as well as difficulty extending/flexing thumb with trace movements noted. Pt then completed dynamic standing tasks at high-low table with focus on weight bearing through LUE and LLE with weight shifting from R side to across midline. Therapist provided facilitation at L elbow and at hand to maintain neutral hand positioning on table. Pt then ambulated back to room as mentioned above and left in room with wife present and chair alarm  set.   Therapy Documentation Precautions:  Precautions Precautions: Fall Restrictions Weight Bearing Restrictions: No  See Function Navigator for Current Functional Status.   Therapy/Group: Individual Therapy  Sanja Elizardo 12/02/2017, 3:16 PM

## 2017-12-02 NOTE — Progress Notes (Signed)
Physical Therapy Session Note  Patient Details  Name: Chad Franklin MRN: [id number] Date of Birth: [birth year]  Today's Date: 12/02/2017 PT Individual Time: 1045-1200 PT Individual Time Calculation (min): 75 min   Short Term Goals: Week 2:  PT Short Term Goal 1 (Week 2): =LTG due to estimated LOS, upgraded to S overall, minA stairs for home entry  Skilled Therapeutic Interventions/Progress Updates:    Patient received in chair, pleasant and willing to work with skilled PT services today. Initiated gait with RW, however within 20-30ft patient's L LE buckled and he stumbled, required ModA to regain balance and prevent fall; nurse tech brought [facility] and patient was sat in it, reports his leg was still at little numb from sitting and PT performed seated exercises to assist in "waking" up leg. He was then able to ambulate to the dayroom and rode Nustep for 10 minutes with B LEs/R UE on level 5, followed by functional hip and LE exercises for strength and muscle activation. Also worked on functional endurance tasks such as horseshoe throwing/picking up horseshoes and endurance reaching activities at [employer] today. He required multiple rest breaks during session due to fatigue. He returned to his room from PT with RW and S, and was left up in the recliner with all needs met, alarm activated, and family present.   Therapy Documentation Precautions:  Precautions Precautions: Fall Restrictions Weight Bearing Restrictions: No General:   Vital Signs:  Pain: Pain Assessment Pain Scale: 0-10 Pain Score: 0-No pain   See Function Navigator for Current Functional Status.   Therapy/Group: Individual Therapy  Deniece Ree PT, DPT, CBIS  Supplemental Physical Therapist [facility]    Pager [phone] Acute Rehab Office [phone]   12/02/2017, 12:26 PM

## 2017-12-02 NOTE — Progress Notes (Signed)
[place] PHYSICAL MEDICINE & REHABILITATION     PROGRESS NOTE  Subjective/Complaints:  Patient seen sitting up in his chair this AM.  He slept well overnight.  He notes improvement in LUE.  ROS: Denies CP, SOB, nausea, vomiting, diarrhea.  Objective: Vital Signs: Blood pressure (!) 145/74, pulse 89, temperature 98.7 F (37.1 C), temperature source Oral, resp. rate 18, height 5\' 8"  (1.727 m), weight 104.5 kg, SpO2 93 %. No results found. No results for input(s): WBC, HGB, HCT, PLT in the last 72 hours. No results for input(s): NA, K, CL, GLUCOSE, BUN, CREATININE, CALCIUM in the last 72 hours.  Invalid input(s): CO CBG (last 3)  Recent Labs    12/01/17 1648 12/01/17 2131 12/02/17 0625  GLUCAP 98 148* 88    Wt Readings from Last 3 Encounters:  11/20/17 104.5 kg  11/14/17 104.7 kg    Physical Exam:  BP (!) 145/74 (BP Location: Left Arm)   Pulse 89   Temp 98.7 F (37.1 C) (Oral)   Resp 18   Ht 5\' 8"  (1.727 m)   Wt 104.5 kg   SpO2 93%   BMI 35.03 kg/m  Constitutional: No distress . Vital signs reviewed. HENT: Normocephalic.  Atraumatic. Eyes: EOMI. No discharge. Cardiovascular: irregularly irregular. No JVD. Respiratory: CTA bilaterally. Normal effort. GI: BS +. Non-distended. Musc: No edema or tenderness in extremities. Neurological: He is alert.  HOH Motor: Right upper extremity 5/5 proximal distal Right lower extremity: 4-4+/5 proximal distal Left upper extremity: 4-4+/5 shoulder abduction, elbow flexion/extension, 4/5 hand grip Left lower extremity: 4/5 proximal to distal, improving Skin: Skin is warm and dry.  Psychiatric: Normal mood.  Normal behavior.   Assessment/Plan: 1. Functional deficits secondary to small acute cortical/subcortical infarct and posterior right frontal lobe and remote left parieto-occipital infarct which require 3+ hours per day of interdisciplinary therapy in a comprehensive inpatient rehab setting. Physiatrist is providing close  team supervision and 24 hour management of active medical problems listed below. Physiatrist and rehab team continue to assess barriers to discharge/monitor patient progress toward functional and medical goals.  Function:  Bathing Bathing position Bathing activity did not occur: Refused Position: Shower  Bathing parts Body parts bathed by patient: Left arm, Chest, Abdomen, Front perineal area, Right upper leg, Left upper leg, Buttocks, Right lower leg, Left lower leg Body parts bathed by helper: Right arm, Back  Bathing assist Assist Level: Touching or steadying assistance(Pt > 75%)      Upper Body Dressing/Undressing Upper body dressing   What is the patient wearing?: Pull over shirt/dress     Pull over shirt/dress - Perfomed by patient: Thread/unthread right sleeve, Put head through opening, Pull shirt over trunk, Thread/unthread left sleeve Pull over shirt/dress - Perfomed by helper: Thread/unthread left sleeve        Upper body assist Assist Level: Supervision or verbal cues      Lower Body Dressing/Undressing Lower body dressing   What is the patient wearing?: Pants, Shoes, Socks Underwear - Performed by patient: Thread/unthread right underwear leg, Thread/unthread left underwear leg, Pull underwear up/down Underwear - Performed by helper: Thread/unthread right underwear leg, Thread/unthread left underwear leg Pants- Performed by patient: Thread/unthread right pants leg, Thread/unthread left pants leg, Pull pants up/down Pants- Performed by helper: Thread/unthread right pants leg, Thread/unthread left pants leg, Pull pants up/down   Non-skid slipper socks- Performed by helper: Don/doff right sock, Don/doff left sock   Socks - Performed by helper: Don/doff right sock, Don/doff left sock Shoes - Performed  by patient: Don/doff left shoe, Don/doff right shoe, Fasten right, Fasten left            Lower body assist Assist for lower body dressing: Touching or steadying  assistance (Pt > 75%)      Toileting Toileting Toileting activity did not occur: No continent bowel/bladder event Toileting steps completed by patient: Adjust clothing prior to toileting, Adjust clothing after toileting, Performs perineal hygiene Toileting steps completed by helper: Adjust clothing prior to toileting, Performs perineal hygiene, Adjust clothing after toileting Toileting Assistive Devices: Grab bar or rail  Toileting assist Assist level: Touching or steadying assistance (Pt.75%)   Transfers Chair/bed transfer   Chair/bed transfer method: Stand pivot Chair/bed transfer assist level: Supervision or verbal cues Chair/bed transfer assistive device: Armrests, Patent attorney     Max distance: 250' Assist level: Supervision or verbal cues   Wheelchair   Type: Manual Max wheelchair distance: 20 Assist Level: Supervision or verbal cues  Cognition Comprehension Comprehension assist level: Follows basic conversation/direction with extra time/assistive device  Expression Expression assist level: Expresses basic needs/ideas: With extra time/assistive device  Social Interaction Social Interaction assist level: Interacts appropriately 90% of the time - Needs monitoring or encouragement for participation or interaction.  Problem Solving Problem solving assist level: Solves basic problems with no assist  Memory Memory assist level: Recognizes or recalls 90% of the time/requires cueing < 10% of the time    Medical Problem List and Plan: 1.  Left-sided weakness as well as cognitive deficits  secondary to small acute cortical/subcortical infarct and posterior right frontal lobe and remote left parieto-occipital infarct.  Cont CIR 2.  DVT Prophylaxis/Anticoagulation: Pharmaceutical: Other (comment)--Apixaban.  3. Pain Management: N/A 4. Mood: LCSW to follow for evaluation and support.  5. Neuropsych: This patient is not fully capable of making decisions on his own  behalf. 6. Skin/Wound Care: Routine pressure relief measures.  7. Fluids/Electrolytes/Nutrition: Monitor I/O.   BMP within acceptable range on 9/12 8. HTN: Monitor BP bid. On coreg, Cozaar and Lasix.   Relatively controlled on 9/16  Appreciate cardiac recs 9. T2DM?: Has been off medications X 1 year. Hgb A1C- 5.8.    Labile on 9/16 10. COPD: SOB treated with ipratropium and Xopenex. Cough resolving. Will monitor with increase in activity.     Weaned supplemental oxygen 11. A fib with RVR: Monitor HR bid--continue coreg, digoxin and apixaban.  12.  H/o depression: managed with paxil  13. Sleep disturbance:  Melatonin started on 9/5, increased on 9/9  improved 14. Acute blood loss anemia  Hemoglobin 12.8 on 9/12  Continue to monitor 15. Hypoalbuminemia  Supplement initiated on 9/9 16. Morbid obesity  Encourage weight loss  LOS (Days) 12 A FACE TO FACE EVALUATION WAS PERFORMED  Ankit Karis Juba 12/02/2017 9:00 AM

## 2017-12-02 NOTE — Progress Notes (Signed)
Physical Therapy Session Note  Patient Details  Name: MONIQUE HOEG MRN: [id number] Date of Birth: 03-31-1934  Today's Date: 12/02/2017 PT Individual Time: 0900-1000 PT Individual Time Calculation (min): 60 min   Short Term Goals: Week 2:  PT Short Term Goal 1 (Week 2): =LTG due to estimated LOS, upgraded to S overall, minA stairs for home entry  Skilled Therapeutic Interventions/Progress Updates: Pt received in recliner, denies pain and agreeable to treatment. Gait to/from gym with RW and and min guard/close S. Performed steps forward/backward over weighted bar with min guard. Step-ups to 6' step x10 reps RLE min guard, x6 reps LLE with min/modA d/t reduced power production in hip/knee extension. Static standing balance with dynamic reaching to retrieve cards from board with feet together to facilitate ankle strategy and anticipatory postural adjustments. Performed biodex catch game on unlocked platform progressed from BUE to RUE support for focus on ankle strategy, weight shifting. Ambulated from biodex to kinetron; LLE buckled x2, recovered with minA; pt reports knee "just gave out". Prolonged seated rest break before next activity. Standing kinetron weight shifting R/L with BUE support, progressed to light RUE support; tactile cueing for L hip/knee extension.      Therapy Documentation Precautions:  Precautions Precautions: Fall Restrictions Weight Bearing Restrictions: No  See Function Navigator for Current Functional Status.   Therapy/Group: Individual Therapy  Harlon Ditty 12/02/2017, 10:06 AM

## 2017-12-03 ENCOUNTER — Inpatient Hospital Stay (HOSPITAL_COMMUNITY): Payer: Medicare HMO | Admitting: Physical Therapy

## 2017-12-03 ENCOUNTER — Inpatient Hospital Stay (HOSPITAL_COMMUNITY): Payer: Medicare HMO | Admitting: Occupational Therapy

## 2017-12-03 LAB — GLUCOSE, CAPILLARY
GLUCOSE-CAPILLARY: 103 mg/dL — AB (ref 70–99)
GLUCOSE-CAPILLARY: 121 mg/dL — AB (ref 70–99)
Glucose-Capillary: 134 mg/dL — ABNORMAL HIGH (ref 70–99)
Glucose-Capillary: 130 mg/dL — ABNORMAL HIGH (ref 70–99)

## 2017-12-03 NOTE — Progress Notes (Signed)
Occupational Therapy Discharge Summary  Patient Details  Name: Chad Franklin MRN: [id number] Date of Birth: 06-Mar-1935  Today's Date: 12/03/2017     Patient has met 11 of 12 long term goals due to improved activity tolerance, improved balance, postural control, ability to compensate for deficits, functional use of  LEFT upper extremity and improved coordination.  Patient to discharge at overall Supervision level, however due to pt's impulsivity and poor safety awareness pt receiving min A care from wife during educational sessions. Patient's care partner is independent to provide the necessary physical assistance at discharge. Pt's wife has been present for the majority of therapy sessions and has been an active participant with education and training provided in functional, transfers from various surfaces as well as tub/shower transfer, hemi-technique, self care tasks and safety with ambulation. Pt continues to have deficits in balance, coordination and requires verbal cueing for attention and use of LUE during tasks. Education provided to family regarding use of gait belt during functional ambulation for safety and for wife to be positioned on L side of pt for decreased fall risk with verbal comprehension and hands on training during sessions. Pt and family educated on performing dressing tasks seated in supportive chair with back support for optimal set up for maximized independence.   Reasons goals not met: Pt did not meet bathing goal of performing task with supervision level assistance. Pt requires assistance for bathing R UE due to deficit of using L UE for completion. Pt's wife present during bathing sessions and assisted with thoroughness.   Recommendation:  Patient will benefit from ongoing skilled OT services in home health setting to continue to advance functional skills in the area of BADL.  Equipment: TTB, BSC  Reasons for discharge: discharge from hospital  Patient/family agrees  with progress made and goals achieved: Yes  OT Discharge Precautions/Restrictions  Precautions Precautions: Fall Restrictions Weight Bearing Restrictions: No Vision Baseline Vision/History: No visual deficits Patient Visual Report: No change from baseline Vision Assessment?: No apparent visual deficits Eye Alignment: Within Functional Limits Perception  Perception: Within Functional Limits Praxis Praxis: Intact Cognition Overall Cognitive Status: History of cognitive impairments - at baseline(Pt has basic level of education and does not know how to read and write. Pt lives simple lifestyle with wife.) Arousal/Alertness: Awake/alert Orientation Level: Oriented X4 Attention: Selective Selective Attention: Impaired Selective Attention Impairment: Verbal complex;Functional complex Memory: Impaired Memory Impairment: Decreased short term memory Decreased Short Term Memory: Functional basic;Verbal basic Awareness: Impaired Awareness Impairment: Emergent impairment Problem Solving: Impaired Problem Solving Impairment: Functional complex;Verbal basic Executive Function: Decision Making Reasoning: Impaired Reasoning Impairment: Functional complex Decision Making: Impaired Decision Making Impairment: Functional complex Behaviors: Impulsive Safety/Judgment: Impaired Comments: Decreased safety awareness with RW  Sensation Sensation Light Touch: Appears Intact Hot/Cold: Appears Intact Proprioception: Impaired Detail Proprioception Impaired Details: Impaired LUE Stereognosis: Not tested Coordination Gross Motor Movements are Fluid and Coordinated: No Fine Motor Movements are Fluid and Coordinated: No Coordination and Movement Description: L hemiplegia; UE with decreased coordination and movement than LE Motor  Motor Motor: Hemiplegia;Ataxia;Abnormal tone Motor - Discharge Observations: L hemiparesis; LUE with greater difficulty than LE however improvements made in LUE over LOS   Mobility  Bed Mobility Bed Mobility: Rolling Right;Rolling Left;Right Sidelying to Sit;Supine to Sit;Sitting - Scoot to Edge of Bed;Sit to Supine;Sit to Sidelying Right Rolling Right: Supervision/verbal cueing Rolling Left: Supervision/Verbal cueing Right Sidelying to Sit: Supervision/Verbal cueing Supine to Sit: Supervision/Verbal cueing Sitting - Scoot to Edge of Bed: Supervision/Verbal cueing Sit to  Supine: Supervision/Verbal cueing Sit to Sidelying Right: Supervision/Verbal cueing Transfers Sit to Stand: Independent with assistive device(RW) Stand to Sit: Independent with assistive device(RW)  Trunk/Postural Assessment  Cervical Assessment Cervical Assessment: Exceptions to WFL(forward head positoning ) Thoracic Assessment Thoracic Assessment: Exceptions to WFL(rounded shoulders (kyphotic) ) Lumbar Assessment Lumbar Assessment: Exceptions to WFL(posterior pelvic tilt ) Postural Control Postural Control: Within Functional Limits  Balance Balance Balance Assessed: Yes Static Sitting Balance Static Sitting - Balance Support: Feet supported Static Sitting - Level of Assistance: 6: Modified independent (Device/Increase time) Dynamic Sitting Balance Dynamic Sitting - Balance Support: Feet supported Dynamic Sitting - Level of Assistance: 5: Stand by assistance Dynamic Sitting Balance - Compensations: sitting in shower chair doffing clothing Static Standing Balance Static Standing - Balance Support: During functional activity Static Standing - Level of Assistance: 5: Stand by assistance Dynamic Standing Balance Dynamic Standing - Balance Support: During functional activity Dynamic Standing - Level of Assistance: 5: Stand by assistance;4: Min assist Extremity/Trunk Assessment RUE Assessment RUE Assessment: Within Functional Limits General Strength Comments: 4/5 LUE Assessment LUE Assessment: Exceptions to [facility] Passive Range of Motion (PROM) Comments: Full PROM of shoulder,  elbow, and wrist Active Range of Motion (AROM) Comments: shoulder flexion ~80 degrees; elbow flexion ~70 degrees  General Strength Comments: shoulder flexion 2+/5, elbow flexion 3-/5, wrist flexion and extension 2+/5, weak gross grasp  LUE Body System: Neuro   See Function Navigator for Current Functional Status.  Payson Crumby 12/03/2017, 11:53 AM

## 2017-12-03 NOTE — Progress Notes (Signed)
Orthopedic Tech Progress Note Patient Details:  KYM DRAWDY 14-Feb-1935 [id number] Brace completed by [employer]. Patient ID: MAXIMIANO SCHIPANI, male   DOB: 1934-12-08, 83 y.o.   MRN: [id number]   Jaxel, Trester 12/03/2017, 4:21 PM

## 2017-12-03 NOTE — Progress Notes (Signed)
Orthopedic Tech Progress Note Patient Details:  Chad Franklin 12/05/1934 [id number] Called [employer] for brace order. Patient ID: RENEL KLINGE, male   DOB: Nov 11, 1934, 83 y.o.   MRN: [id number]   Justen, Casalino 12/03/2017, 11:42 AM

## 2017-12-03 NOTE — Progress Notes (Signed)
Occupational Therapy Session Note  Patient Details  Name: EMERI MCBREEN MRN: [id number] Date of Birth: 10-24-1934  Today's Date: 12/03/2017 OT Individual Time:  0945-1100 (60 mins)     Short Term Goals: Week 2:  OT Short Term Goal 1 (Week 2): STG = LTG due to LOS   Skilled Therapeutic Interventions/Progress Updates:    Pt sitting in recliner with wife present upon entry with no reports of pain. Pt performed all functional transfers this session with close supervision and use of RW. Pt's wife willing to participate in therapy education session and provide assistance as needed. Pt ambulated to bathroom and performed all bathing tasks while seated including use of lateral leans for buttocks. Pt's wife educated on level of assistance needed during bathing tasks such as setting up environment, providing verbal cues for thoroughness during bathing, and using gait belt in bathroom for safety. Pt ambulated to w/c and performed dressing tasks while seated with good recall of hemi technique. Pt performed grooming tasks while standing with min vc's required for wife to stand on L side of pt.   While seated in w/c, pt and wife educated on HEP with focus on L UE range of motion with shoulder flexion and external/internal rotation, elbow flexion/extension, wrist flexion/extension, and digit flexion/extension. Pt given copy of handout with pictures due to pt's inability to read at baseline. Pt required assistance for hand placement for proper technique with min-mod verbal cueing for maintaining hand placement throughout repetitions. Discussed with pt and his wife regarding incorporating LUE tasks as much as possible throughout functional tasks upon d/c at home such as washing table, combing hair, etc for maximal improvement. Pt left in room with all needs in reach, chair alarm set and wife present.   Therapy Documentation Precautions:  Precautions Precautions: Fall Restrictions Weight Bearing Restrictions:  No    See Function Navigator for Current Functional Status.  Therapy/Group: Individual Therapy  Jantzen Pilger 12/03/2017, 7:20 AM

## 2017-12-03 NOTE — Progress Notes (Signed)
[place] PHYSICAL MEDICINE & REHABILITATION     PROGRESS NOTE  Subjective/Complaints:  Patient seen sitting up in his chair this morning.  He states he slept well overnight.  He is looking forward to going home tomorrow.  Discussed with therapies regarding patient's thumb positioning.  ROS: Denies CP, SOB, nausea, vomiting, diarrhea.  Objective: Vital Signs: Blood pressure 128/83, pulse 81, temperature 98.2 F (36.8 C), temperature source Oral, resp. rate 18, height 5\' 8"  (1.727 m), weight 104.5 kg, SpO2 93 %. No results found. No results for input(s): WBC, HGB, HCT, PLT in the last 72 hours. No results for input(s): NA, K, CL, GLUCOSE, BUN, CREATININE, CALCIUM in the last 72 hours.  Invalid input(s): CO CBG (last 3)  Recent Labs    12/02/17 1659 12/02/17 2114 12/03/17 0620  GLUCAP 138* 86 103*    Wt Readings from Last 3 Encounters:  11/20/17 104.5 kg  11/14/17 104.7 kg    Physical Exam:  BP 128/83 (BP Location: Left Arm)   Pulse 81   Temp 98.2 F (36.8 C) (Oral)   Resp 18   Ht 5\' 8"  (1.727 m)   Wt 104.5 kg   SpO2 93%   BMI 35.03 kg/m  Constitutional: No distress . Vital signs reviewed. HENT: Normocephalic.  Atraumatic. Eyes: EOMI. No discharge. Cardiovascular: Irregularly irregular.  No JVD. Respiratory: CTA bilaterally. Normal effort. GI: BS +. Non-distended. Musc: No edema or tenderness in extremities. Neurological: He is alert.  HOH Motor: Right upper extremity 5/5 proximal distal Right lower extremity: 4+/5 proximal distal Left upper extremity: 4-4+/5 shoulder abduction, elbow flexion/extension, 4/5 hand grip, except for thumb Left lower extremity: 4-4+/5 proximal to distal, improving Skin: Skin is warm and dry.  Psychiatric: Normal mood.  Normal behavior.   Assessment/Plan: 1. Functional deficits secondary to small acute cortical/subcortical infarct and posterior right frontal lobe and remote left parieto-occipital infarct which require 3+ hours per  day of interdisciplinary therapy in a comprehensive inpatient rehab setting. Physiatrist is providing close team supervision and 24 hour management of active medical problems listed below. Physiatrist and rehab team continue to assess barriers to discharge/monitor patient progress toward functional and medical goals.  Function:  Bathing Bathing position Bathing activity did not occur: Refused Position: Shower  Bathing parts Body parts bathed by patient: Left arm, Chest, Abdomen, Front perineal area, Right upper leg, Left upper leg, Buttocks, Right lower leg, Left lower leg Body parts bathed by helper: Right arm, Back  Bathing assist Assist Level: Touching or steadying assistance(Pt > 75%)      Upper Body Dressing/Undressing Upper body dressing   What is the patient wearing?: Pull over shirt/dress     Pull over shirt/dress - Perfomed by patient: Thread/unthread right sleeve, Put head through opening, Pull shirt over trunk, Thread/unthread left sleeve Pull over shirt/dress - Perfomed by helper: Thread/unthread left sleeve        Upper body assist Assist Level: Supervision or verbal cues      Lower Body Dressing/Undressing Lower body dressing   What is the patient wearing?: Pants, Shoes, Socks Underwear - Performed by patient: Thread/unthread right underwear leg, Thread/unthread left underwear leg, Pull underwear up/down Underwear - Performed by helper: Thread/unthread right underwear leg, Thread/unthread left underwear leg Pants- Performed by patient: Thread/unthread right pants leg, Thread/unthread left pants leg, Pull pants up/down Pants- Performed by helper: Thread/unthread right pants leg, Thread/unthread left pants leg, Pull pants up/down   Non-skid slipper socks- Performed by helper: Don/doff right sock, Don/doff left sock  Socks - Performed by helper: Don/doff right sock, Don/doff left sock Shoes - Performed by patient: Don/doff left shoe, Don/doff right shoe, Fasten  right, Fasten left            Lower body assist Assist for lower body dressing: Touching or steadying assistance (Pt > 75%)      Toileting Toileting Toileting activity did not occur: No continent bowel/bladder event Toileting steps completed by patient: Adjust clothing prior to toileting, Performs perineal hygiene, Adjust clothing after toileting Toileting steps completed by helper: Adjust clothing prior to toileting, Performs perineal hygiene, Adjust clothing after toileting Toileting Assistive Devices: Grab bar or rail  Toileting assist Assist level: Touching or steadying assistance (Pt.75%)   Transfers Chair/bed transfer   Chair/bed transfer method: Stand pivot, Ambulatory Chair/bed transfer assist level: Supervision or verbal cues Chair/bed transfer assistive device: Armrests, Patent attorney     Max distance: 250 Assist level: Supervision or verbal cues   Wheelchair   Type: Manual Max wheelchair distance: 20 Assist Level: Supervision or verbal cues  Cognition Comprehension Comprehension assist level: Follows basic conversation/direction with no assist  Expression Expression assist level: Expresses basic needs/ideas: With no assist  Social Interaction Social Interaction assist level: Interacts appropriately 90% of the time - Needs monitoring or encouragement for participation or interaction.  Problem Solving Problem solving assist level: Solves basic problems with no assist  Memory Memory assist level: Recognizes or recalls 90% of the time/requires cueing < 10% of the time    Medical Problem List and Plan: 1.  Left-sided weakness as well as cognitive deficits  secondary to small acute cortical/subcortical infarct and posterior right frontal lobe and remote left parieto-occipital infarct.  Cont CIR  Resting hand splint ordered for thumb  Plan for d/c tomorrow  Will see patient for transitional care management in 1-2 weeks post-discharge 2.  DVT  Prophylaxis/Anticoagulation: Pharmaceutical: Other (comment)--Apixaban.  3. Pain Management: N/A 4. Mood: LCSW to follow for evaluation and support.  5. Neuropsych: This patient is capable of making decisions on his own behalf. 6. Skin/Wound Care: Routine pressure relief measures.  7. Fluids/Electrolytes/Nutrition: Monitor I/O.   BMP within acceptable range on 9/12 8. HTN: Monitor BP bid. On coreg, Cozaar and Lasix.   Controlled on 9/17  Appreciate cardiac recs 9. T2DM?: Has been off medications X 1 year. Hgb A1C- 5.8.    Controlled on 9/17 10. COPD: SOB treated with ipratropium and Xopenex. Cough resolving. Will monitor with increase in activity.     Weaned supplemental oxygen 11. A fib with RVR: Monitor HR bid--continue coreg, digoxin and apixaban.  12.  H/o depression: managed with paxil  13. Sleep disturbance:  Melatonin started on 9/5, increased on 9/9  improved 14. Acute blood loss anemia  Hemoglobin 12.8 on 9/12  Continue to monitor 15. Hypoalbuminemia  Supplement initiated on 9/9 16. Morbid obesity  Encourage weight loss  LOS (Days) 13 A FACE TO FACE EVALUATION WAS PERFORMED  Ankit Karis Juba 12/03/2017 8:58 AM

## 2017-12-03 NOTE — Progress Notes (Signed)
Physical Therapy Discharge Summary  Patient Details  Name: Chad Franklin MRN: [id number] Date of Birth: 03/05/1935  Today's Date: 12/03/2017 PT Individual Time: 0800-0900 PT Individual Time Calculation (min): 60 min    Patient has met 8 of 10 long term goals due to improved activity tolerance, improved balance, improved postural control, increased strength and improved coordination.  Patient to discharge at an ambulatory level min guard.   Patient's care partner is independent to provide the necessary physical and cognitive assistance at discharge.  Reasons goals not met: Gait and stair goal unmet; Pt wife demonstrates ability to provide min guard with ambulation in home and for stair navigation.  Recommendation:  Patient will benefit from ongoing skilled PT services in home health setting to continue to advance safe functional mobility, address ongoing impairments in postural control, dynamic standing balance with direction change, stepping strategies, and minimize fall risk.  Equipment: RW  Reasons for discharge: treatment goals met and discharge from hospital  Patient/family agrees with progress made and goals achieved: Yes  PT Discharge Precautions/Restrictions Precautions Precautions: Fall Precaution Comments: L hemiparesis Restrictions Weight Bearing Restrictions: No Vision/Perception  Vision - Assessment Eye Alignment: Within Functional Limits Perception Perception: Within Functional Limits Praxis Praxis: Intact  Cognition Overall Cognitive Status: Impaired/Different from baseline Arousal/Alertness: Awake/alert Orientation Level: Oriented X4 Attention: Sustained;Focused Focused Attention: Appears intact Sustained Attention: Appears intact Selective Attention: Appears intact Memory: Appears intact Awareness: Impaired Awareness Impairment: Anticipatory impairment Safety/Judgment: Impaired Comments: forgets to use LUE and forgets to use RW; requires cues for  safety with RW Motor  Motor Motor: Hemiplegia;Abnormal postural alignment and control(Left sided) Motor - Discharge Observations: L hemiparesis LUE>LLE  Mobility Bed Mobility Bed Mobility: Rolling Right;Rolling Left;Right Sidelying to Sit;Supine to Sit;Sitting - Scoot to Edge of Bed;Sit to Supine;Sit to Sidelying Right Rolling Right: Supervision/verbal cueing Rolling Left: Supervision/Verbal cueing Right Sidelying to Sit: Supervision/Verbal cueing Supine to Sit: Supervision/Verbal cueing Sitting - Scoot to Edge of Bed: Supervision/Verbal cueing Sit to Supine: Supervision/Verbal cueing Sit to Sidelying Right: Supervision/Verbal cueing Transfers Transfers: Sit to Stand;Stand to Sit;Stand Pivot Transfers Sit to Stand: Independent with assistive device(RW) Stand to Sit: Independent with assistive device(RW) Stand Pivot Transfers: Supervision/Verbal cueing(RW) Stand Pivot Transfer Details: Verbal cues for precautions/safety Transfer (Assistive device): Rolling walker Locomotion  Gait Ambulation: Yes Gait Assistance: Contact Guard/Touching assist(RW) Gait Distance (Feet): 220 Feet Assistive device: Rolling walker;Other (Comment)(dycem on L hand grip) Gait Assistance Details: Verbal cues for precautions/safety Gait Assistance Details: Wife provided CGA Gait Gait: Yes Gait Pattern: Impaired Gait Pattern: Trendelenburg;Lateral trunk lean to right;Decreased stance time - left Gait velocity: slower than normal compared to age matched peers  Stairs / Additional Locomotion Stairs: Yes Stairs Assistance: Contact Guard/Touching assist Stair Management Technique: Two rails;Step to pattern;Forwards Number of Stairs: 4 Height of Stairs: 6 Ramp: Contact Guard/touching assist(RW) Curb: Contact Guard/Touching assist(RW) Wheelchair Mobility Wheelchair Mobility: No  Trunk/Postural Assessment  Cervical Assessment Cervical Assessment: Exceptions to WFL(forward head ) Thoracic  Assessment Thoracic Assessment: Exceptions to WFL(R lean; L trunk lengthening) Lumbar Assessment Lumbar Assessment: Exceptions to WFL(reduced lumbar lordosis; posterior pelvic tilt) Postural Control Postural Control: Within Functional Limits  Balance Balance Balance Assessed: Yes Standardized Balance Assessment Standardized Balance Assessment: Timed Up and Go Test Timed Up and Go Test TUG: Normal TUG Normal TUG (seconds): 19(RW) Static Sitting Balance Static Sitting - Balance Support: No upper extremity supported Static Sitting - Level of Assistance: 7: Independent Dynamic Sitting Balance Dynamic Sitting - Balance Support: During functional activity;Feet supported Dynamic Sitting -  Level of Assistance: 7: Independent Static Standing Balance Static Standing - Balance Support: Bilateral upper extremity supported Static Standing - Level of Assistance: 6: Modified independent (Device/Increase time)(RW) Static Stance: Eyes Closed: 30 sec with CGA Static Stance: on Foam: 30 sec with CGA Static Stance: on Foam, Eyes Closed: 30 sec with CGA Dynamic Standing Balance Dynamic Standing - Balance Support: During functional activity;Left upper extremity supported;Bilateral upper extremity supported Dynamic Standing - Level of Assistance: 5: Stand by assistance Dynamic Standing - Balance Activities: Reaching for objects;[place];Forward lean/weight shifting;Lateral lean/weight shifting;Reaching across midline;Kicking ball;Compliant surfaces;[employer] Extremity Assessment  RUE Assessment RUE Assessment: Within Functional Limits General Strength Comments: 4/5 LUE Assessment LUE Assessment: Exceptions to [facility] Passive Range of Motion (PROM) Comments: Full PROM of shoulder, elbow, and wrist Active Range of Motion (AROM) Comments: shoulder flexion ~80 degrees; elbow flexion ~70 degrees  General Strength Comments: shoulder flexion 2+/5, elbow flexion 3-/5, wrist flexion and extension 2+/5, weak gross  grasp  LUE Body System: Neuro RLE Assessment RLE Assessment: Exceptions to [facility] Passive Range of Motion (PROM) Comments: WFL Active Range of Motion (AROM) Comments: WFL General Strength Comments: 3/5 R hip ext, 4-/5 hip flex; 5/5 all others LLE Assessment LLE Assessment: Exceptions to [facility] Passive Range of Motion (PROM) Comments: WFL Active Range of Motion (AROM) Comments: WFL General Strength Comments: 3/5 hip ext, 4-/5 hip flex; 5/5 all others  Skilled Therapeutic Intervention:  Pt received in recliner, denies pain and agreeable to treatment. Shoes donned totalA for time management. Assessed mobility and balance as above with min guard to S overall using RW. Pt and wife educated/demonstrated the ability to use RW, gait belt, and provide min guard assist with ambulation and stair navigation for safety. Therapeutic exercises to improve balance and strength the LEs: sit to stands, standing marches, standing bil hip abduction, heel raises. Pt instructed to do these as HEP and was provided a handout with pictures and written instructions. Pt and wife had no further questions regarding discharge at this time.  See Function Navigator for Current Functional Status.  [place] Durelle Zepeda, SPT  12/03/2017, 8:44 AM

## 2017-12-03 NOTE — Progress Notes (Signed)
Occupational Therapy Session Note  Patient Details  Name: Chad Franklin MRN: [id number] Date of Birth: 10-02-1934  Today's Date: 12/03/2017 OT Individual Time: 1400-1500 OT Individual Time Calculation (min): 60 min    Short Term Goals: Week 2:  OT Short Term Goal 1 (Week 2): STG = LTG due to LOS   Skilled Therapeutic Interventions/Progress Updates:    Pt presents sitting up in recliner with spouse present, agreeable to OT tx session but requesting to complete therapy session in-room, no c/o pain. Reviewed LUE HEP issued during previous therapy sessions; pt return demonstrating UE exercises with mod cues for technique, completing each x10 reps. Spouse observing and reports understanding of exercises, reports able to assist pt PRN after return home. Pt engaged in additional LUE activity both in sitting and standing with focus on gross grasp and use of LUE as gross stabilizer when opening/closing containers. Performed standing LB exercises including marching in place at RW, hip abduction x10 reps each. Performed x5 sit<>stands with focus on placing bil UEs on LLE during transition for increased proprioception into LUE/LLE. Pt requiring minA for sit<>stand due to decreased UE support with transitions. Pt performed standing/reaching activity with focus on wt shifting/wt bearing into LUE/LLE while reaching and placing cups to L/R. Pt left seated in recliner end of session with chair alarm set, call bell and needs within reach.   Therapy Documentation Precautions:  Precautions Precautions: Fall Restrictions Weight Bearing Restrictions: No     Therapy/Group: Individual Therapy  Orlando Penner 12/03/2017, 4:09 PM

## 2017-12-03 NOTE — Plan of Care (Signed)
  Problem: Consults Goal: RH STROKE PATIENT EDUCATION Description See Patient Education module for education specifics  Outcome: Progressing Goal: Nutrition Consult-if indicated Outcome: Progressing Goal: Diabetes Guidelines if Diabetic/Glucose > 140 Description If diabetic or lab glucose is > 140 mg/dl - Initiate Diabetes/Hyperglycemia Guidelines & Document Interventions  Outcome: Progressing   Problem: RH BOWEL ELIMINATION Goal: RH STG MANAGE BOWEL WITH ASSISTANCE Description STG Manage Bowel with min Assistance.  Outcome: Progressing Goal: RH STG MANAGE BOWEL W/MEDICATION W/ASSISTANCE Description STG Manage Bowel with Medication with min Assistance.  Outcome: Progressing   Problem: RH SKIN INTEGRITY Goal: RH STG SKIN FREE OF INFECTION/BREAKDOWN Description Skin free of infection/breakdown with supervision  Outcome: Progressing Goal: RH STG MAINTAIN SKIN INTEGRITY WITH ASSISTANCE Description STG Maintain Skin Integrity With min Assistance.  Outcome: Progressing   Problem: RH SAFETY Goal: RH STG ADHERE TO SAFETY PRECAUTIONS W/ASSISTANCE/DEVICE Description STG Adhere to Safety Precautions With min Assistance/Device.  Outcome: Progressing Goal: RH STG DECREASED RISK OF FALL WITH ASSISTANCE Description STG Decreased Risk of Fall With min Assistance.  Outcome: Progressing   Problem: RH KNOWLEDGE DEFICIT Goal: RH STG INCREASE KNOWLEDGE OF DIABETES Description Patient able to demonstrate injection of insulin with supervision  Outcome: Progressing Goal: RH STG INCREASE KNOWLEDGE OF HYPERTENSION Description Patient able to verbalize sign of hypo and hypertension with min assist  Outcome: Progressing Goal: RH STG INCREASE KNOWLEDGE OF STROKE PROPHYLAXIS Description Patient able to verbalize prevent, signs and symptoms of stroke and what in case exhibiting signs of a stroke with min assistance  Outcome: Progressing

## 2017-12-04 DIAGNOSIS — Z23 Encounter for immunization: Secondary | ICD-10-CM | POA: Diagnosis not present

## 2017-12-04 LAB — GLUCOSE, CAPILLARY: Glucose-Capillary: 93 mg/dL (ref 70–99)

## 2017-12-04 MED ORDER — ACETAMINOPHEN 500 MG PO TABS: 500.0000 mg | ORAL_TABLET | Freq: Three times a day (TID) | ORAL | 0 refills | Status: DC | PRN

## 2017-12-04 MED ORDER — DIGOXIN 125 MCG PO TABS: 0.1250 mg | ORAL_TABLET | Freq: Every day | ORAL | 0 refills | Status: DC

## 2017-12-04 MED ORDER — PAROXETINE HCL 20 MG PO TABS: 20.0000 mg | ORAL_TABLET | Freq: Every day | ORAL | 0 refills | Status: DC

## 2017-12-04 MED ORDER — PRAVASTATIN SODIUM 10 MG PO TABS: 10.0000 mg | ORAL_TABLET | Freq: Every day | ORAL | 0 refills | Status: DC

## 2017-12-04 MED ORDER — POTASSIUM CHLORIDE CRYS ER 20 MEQ PO TBCR: 20.0000 meq | EXTENDED_RELEASE_TABLET | Freq: Every day | ORAL | 0 refills | Status: DC

## 2017-12-04 MED ORDER — LOSARTAN POTASSIUM 25 MG PO TABS: 25.0000 mg | ORAL_TABLET | Freq: Every day | ORAL | 0 refills | Status: DC

## 2017-12-04 MED ORDER — CARVEDILOL 6.25 MG PO TABS: 18.7500 mg | ORAL_TABLET | Freq: Two times a day (BID) | ORAL | 0 refills | Status: DC

## 2017-12-04 MED ORDER — APIXABAN 5 MG PO TABS: 5.0000 mg | ORAL_TABLET | Freq: Two times a day (BID) | ORAL | 0 refills | Status: DC

## 2017-12-04 MED ORDER — FUROSEMIDE 20 MG PO TABS: 40.0000 mg | ORAL_TABLET | Freq: Every day | ORAL | 0 refills | Status: DC

## 2017-12-04 MED ORDER — MELATONIN 3 MG PO TABS: 3.0000 mg | ORAL_TABLET | Freq: Every day | ORAL | 0 refills | Status: DC

## 2017-12-04 MED FILL — ELIQUIS 5 MG TABLET: 5 | 30 days supply | Qty: 60 | Fill #0

## 2017-12-04 NOTE — Discharge Summary (Signed)
Physician Discharge Summary  Patient ID: Chad Franklin MRN: [id number] DOB/AGE: 81/81/1936 81 y.o.  Admit date: 11/20/2017 Discharge date: 12/04/2017  Discharge Diagnoses:  Principal Problem:   Ischemic stroke of frontal lobe [facility] Active Problems:   Sleep disturbance   Hypoalbuminemia due to protein-calorie malnutrition (HCC)   Morbid obesity (HCC)   Benign essential HTN   Diabetes mellitus type 2 in obese [facility])   Discharged Condition: stable   Significant Diagnostic Studies:   Labs:  Basic Metabolic Panel: BMP Latest Ref Rng & Units 11/28/2017 11/21/2017 11/20/2017  Glucose 70 - 99 mg/dL 96 017(P) 102(H)  BUN 8 - 23 mg/dL 21 20 19   Creatinine 0.61 - 1.24 mg/dL 8.52 7.78 2.42  Sodium 135 - 145 mmol/L 138 137 139  Potassium 3.5 - 5.1 mmol/L 4.6 3.8 3.7  Chloride 98 - 111 mmol/L 104 102 102  CO2 22 - 32 mmol/L 27 26 27   Calcium 8.9 - 10.3 mg/dL 3.5(T) 6.1(W) 8.3(L)    CBC: CBC Latest Ref Rng & Units 11/28/2017 11/21/2017 11/18/2017  WBC 4.0 - 10.5 K/uL 7.7 9.5 8.4  Hemoglobin 13.0 - 17.0 g/dL 12.8(L) 12.5(L) 12.0(L)  Hematocrit 39.0 - 52.0 % 41.9 39.6 38.9(L)  Platelets 150 - 400 K/uL 312 177 144(L)    CBG: Recent Labs  Lab 12/03/17 0620 12/03/17 1134 12/03/17 1650 12/03/17 2054 12/04/17 0624  GLUCAP 103* 121* 134* 130* 93    Brief HPI:   Chad Franklin is an 83 year old male with history of COPD, diet-controlled DM, prior CVA, A. fib on Coumadin who was admitted on 11/14/17 with sudden onset of left-sided weakness.  INR subtherapeutic at admission and CT negative therefore treat treated with TPA.  2D echo showed EF of 35 to 40% with apical and anteroseptal myocardial akinesis.  MRI of brain revealed small acute cortical/subacute infarct and posterior right frontal lobe and remote left parieto-occipital infarct.  Cardiology consulted for input and recommended DOAC.  Myoview done for work-up and revealed revealed EF of 28% with global hypokinesis, no reversible ischemia and  fixed inferior lateral wall which may represent infarct.  He has had A. fib with RVR therefore metoprolol and digoxin added for control and Coreg titrated upwards.  Therapy evaluations revealed patient with deficits due to left-sided weakness as well as cognitive deficits.  CIR was recommended for progressive therapy   Hospital Course: Chad Franklin was admitted to rehab 11/20/2017 for inpatient therapies to consist of PT and OT at least three hours five days a week. Past admission physiatrist, therapy team and rehab RN have worked together to provide customized collaborative inpatient rehab.  Patient was maintained on apixaban during the stay with serial H&H for monitoring.  Hemoglobin is relatively stable at 12.8 and thrombocytopenia has resolved.  No signs of bleeding noted.  He received one month supply of apixaban and pharmacy has helped family sign up with drugh assistance program to help manage copays. Blood sugars have been monitored on AC/at bedtime basis and have been relatively controlled on carb modified diet.  Heart rate has been stable on current regimen and no chest pain reported with increase in activity.  Respiratory status is improving and shortness of breath is resolved therefore nebulizes for DC.  He is continent of bowel and bladder.  He has had issues with sleep-wake disruption and melatonin was added to help manage insomnia. His wife has been supportive and has been present for most of his stay for psychological support. He has made progress during his stay  but continues to be limited by impulsivity with poor safety awareness. Supervision is recommended at discharge and he will continue to receive follow up HHPT and HHOT by [facility] after discharge.   Rehab course: During patient's stay in rehab weekly team conferences were held to monitor patient's progress, set goals and discuss barriers to discharge. At admission, patient required max assist with basic self care tasks and mod  assist with  Mobility.  Cognitive linguistic evaluation done and wife reported patient with very little education and that he was unable to read or write.  Wife did not report any cognitive deficits and reported that patient was able to perform basic money tasks that were functional to him.  He did demonstrate selective attention he was able to comprehend basic information and expressed basic needs and solve basic tasks therefore no further speech therapy indicated during the stay. He  has had improvement in activity tolerance, balance, postural control as well as ability to compensate for deficits. He/She has had improvement in functional use LUE  and LLE as well as improvement in awareness.  He is able to complete ADL tasks with supervision.  He requires supervision with verbal cues for stand pivot transfers.  He requires contact-guard assist with rolling walker to ambulate to 20 feet and to navigate for stairs.  He continues to have impulsivity with poor safety awareness and wife has been educated on performing dressing tasks while seated as well as verbal cues for attention to left upper extremity, safety with mobility as well as cognitive tasks.   Disposition: Home  Diet: Carb Modified/Heart healthy.   Special Instructions: 1. NO driving. Needs 24 hours supervision and assistance with medication management. 2. Will needs to follow up with cardiology and neurology in a few weeks.   Discharge Instructions    Ambulatory referral to Physical Medicine Rehab   Complete by:  As directed    1 to 2 weeks transition care appointment     Allergies as of 12/04/2017      Reactions   Naproxen Sodium Anaphylaxis, Hives, Shortness Of Breath, Swelling      Medication List    STOP taking these medications   bisacodyl 10 MG suppository Commonly known as:  DULCOLAX   losartan-hydrochlorothiazide 100-12.5 MG tablet Commonly known as:  HYZAAR   ondansetron 4 MG/2ML Soln injection Commonly known as:   ZOFRAN   verapamil 240 MG CR tablet Commonly known as:  CALAN-SR     TAKE these medications   acetaminophen 500 MG tablet Commonly known as:  TYLENOL Take 1 tablet (500 mg total) by mouth every 8 (eight) hours as needed. What changed:    when to take this  reasons to take this  Another medication with the same name was removed. Continue taking this medication, and follow the directions you see here.   apixaban 5 MG Tabs tablet Commonly known as:  ELIQUIS Take 1 tablet (5 mg total) by mouth 2 (two) times daily.   carvedilol 6.25 MG tablet Commonly known as:  COREG Take 3 tablets (18.75 mg total) by mouth 2 (two) times daily with a meal. What changed:    medication strength  how much to take   digoxin 0.125 MG tablet Commonly known as:  LANOXIN Take 1 tablet (0.125 mg total) by mouth daily.   furosemide 20 MG tablet Commonly known as:  LASIX Take 2 tablets (40 mg total) by mouth daily.   ipratropium 0.02 % nebulizer solution Commonly known as:  ATROVENT Take 2.5 mLs (0.5 mg total) by nebulization every 6 (six) hours as needed for wheezing or shortness of breath.   levalbuterol 1.25 MG/0.5ML nebulizer solution Commonly known as:  XOPENEX Take 1.25 mg by nebulization every 6 (six) hours as needed for wheezing or shortness of breath.   losartan 25 MG tablet Commonly known as:  COZAAR Take 1 tablet (25 mg total) by mouth daily.   Melatonin 3 MG Tabs Take 1 tablet (3 mg total) by mouth at bedtime.   multivitamin with minerals Tabs tablet Take 1 tablet by mouth daily. What changed:  Another medication with the same name was removed. Continue taking this medication, and follow the directions you see here.   PARoxetine 20 MG tablet Commonly known as:  PAXIL Take 1 tablet (20 mg total) by mouth daily.   potassium chloride SA 20 MEQ tablet Commonly known as:  K-DUR,KLOR-CON Take 1 tablet (20 mEq total) by mouth daily.   pravastatin 10 MG tablet Commonly known  as:  PRAVACHOL Take 1 tablet (10 mg total) by mouth daily.   senna-docusate 8.6-50 MG tablet Commonly known as:  Senokot-S Take 1 tablet by mouth at bedtime as needed for mild constipation.      Follow-up Information    Lonie Peak, PA-C Follow up.   Specialty:  Physician Assistant Contact information: [address] [place] [address] [phone]        Marcello Fennel, MD Follow up.   Specialty:  Physical Medicine and Rehabilitation Why:  Office will call you with follow-up appointment Contact information: [address] [place] [address] [phone]        Jodelle Red, MD. Call.   Specialty:  Cardiology Why:  for follow up appointment in 2-3 weeks.  Contact information: [address] [place] [address] [phone]           Signed: Jacquelynn Cree 12/05/2017, 8:37 AM

## 2017-12-04 NOTE — Discharge Instructions (Signed)
Inpatient Rehab Discharge Instructions  Chad Franklin Discharge date and time:  12/04/17  Activities/Precautions/ Functional Status: Activity: no lifting, driving, or strenuous exercise for Until cleared by MD Diet: cardiac diet and diabetic diet Wound Care: none needed   Functional status:  ___ No restrictions     ___ Walk up steps independently _X__ 24/7 supervision/assistance   ___ Walk up steps with assistance ___ Intermittent supervision/assistance  ___ Bathe/dress independently ___ Walk with walker     _X__ Bathe/dress with assistance ___ Walk Independently    ___ Shower independently _X__ Walk with assistance    ___ Shower with assistance _X__ No alcohol     ___ Return to work/school ________   Special Instructions: 1. No Driving.    COMMUNITY REFERRALS UPON DISCHARGE:    Home Health:   PT, OT  Agency:[facility] Phone:[phone]   Date of last service:12/04/2017  Medical Equipment/Items Ordered:ROLLING WALKER, BEDSIDE COMMODE & TUB BENCH  Agency/Supplier:[facility]   [phone]    STROKE/TIA DISCHARGE INSTRUCTIONS SMOKING Cigarette smoking nearly doubles your risk of having a stroke & is the single most alterable risk factor  If you smoke or have smoked in the last 12 months, you are advised to quit smoking for your health.  Most of the excess cardiovascular risk related to smoking disappears within a year of stopping.  Ask you doctor about anti-smoking medications  [place] Quit Line: 1-800-QUIT NOW  Free Smoking Cessation Classes [phone]  CHOLESTEROL Know your levels; limit fat & cholesterol in your diet  Lipid Panel     Component Value Date/Time   CHOL 116 11/15/2017 0342   TRIG 54 11/15/2017 0342   HDL 39 (L) 11/15/2017 0342   CHOLHDL 3.0 11/15/2017 0342   VLDL 11 11/15/2017 0342   LDLCALC 66 11/15/2017 0342      Many patients benefit from treatment even if their cholesterol is at goal.  Goal: Total Cholesterol (CHOL) less than  160  Goal:  Triglycerides (TRIG) less than 150  Goal:  HDL greater than 40  Goal:  LDL (LDLCALC) less than 100   BLOOD PRESSURE American Stroke Association blood pressure target is less that 120/80 mm/Hg  Your discharge blood pressure is:  BP: 130/89  Monitor your blood pressure  Limit your salt and alcohol intake  Many individuals will require more than one medication for high blood pressure  DIABETES (A1c is a blood sugar average for last 3 months) Goal HGBA1c is under 7% (HBGA1c is blood sugar average for last 3 months)  Diabetes: No known diagnosis of diabetes    Lab Results  Component Value Date   HGBA1C 5.8 (H) 11/15/2017     Your HGBA1c can be lowered with medications, healthy diet, and exercise.  Check your blood sugar as directed by your physician  Call your physician if you experience unexplained or low blood sugars.  PHYSICAL ACTIVITY/REHABILITATION Goal is 30 minutes at least 4 days per week  Activity: No driving, Therapies: See above Return to work: Not applicable  Activity decreases your risk of heart attack and stroke and makes your heart stronger.  It helps control your weight and blood pressure; helps you relax and can improve your mood.  Participate in a regular exercise program.  Talk with your doctor about the best form of exercise for you (dancing, walking, swimming, cycling).  DIET/WEIGHT Goal is to maintain a healthy weight  Your discharge diet is:  Diet Order  Diet heart healthy/carb modified Room service appropriate? Yes; Fluid consistency: Thin  Diet effective now             liquids Your height is:  Height: 5\' 8"  (172.7 cm) Your current weight is: Weight: 105 kg Your Body Mass Index (BMI) is:  BMI (Calculated): 35.21  Following the type of diet specifically designed for you will help prevent another stroke.  Your goal weight is: 164 lbs  Your goal Body Mass Index (BMI) is 19-24.  Healthy food habits can help reduce 3 risk  factors for stroke:  High cholesterol, hypertension, and excess weight.  RESOURCES Stroke/Support Group:  Call [phone]   STROKE EDUCATION PROVIDED/REVIEWED AND GIVEN TO PATIENT Stroke warning signs and symptoms How to activate emergency medical system (call 911). Medications prescribed at discharge. Need for follow-up after discharge. Personal risk factors for stroke. Pneumonia vaccine given:  Flu vaccine given:  My questions have been answered, the writing is legible, and I understand these instructions.  I will adhere to these goals & educational materials that have been provided to me after my discharge from the hospital.     My questions have been answered and I understand these instructions. I will adhere to these goals and the provided educational materials after my discharge from the hospital.  Patient/Caregiver Signature _______________________________ Date __________  Clinician Signature _______________________________________ Date __________  Please bring this form and your medication list with you to all your follow-up doctor's appointments. Information on my medicine - ELIQUIS (apixaban)  This medication education was reviewed with me or my healthcare representative as part of my discharge preparation.  The pharmacist that spoke with me during my hospital stay was:  Jacquelynn Cree, PA-C  Why was Eliquis prescribed for you? Eliquis was prescribed for you to reduce the risk of a blood clot forming that can cause a stroke if you have a medical condition called atrial fibrillation (a type of irregular heartbeat).  What do You need to know about Eliquis ? Take your Eliquis TWICE DAILY - one tablet in the morning and one tablet in the evening with or without food. If you have difficulty swallowing the tablet whole please discuss with your pharmacist how to take the medication safely.  Take Eliquis exactly as prescribed by your doctor and DO NOT stop taking Eliquis  without talking to the doctor who prescribed the medication.  Stopping may increase your risk of developing a stroke.  Refill your prescription before you run out.  After discharge, you should have regular check-up appointments with your healthcare provider that is prescribing your Eliquis.  In the future your dose may need to be changed if your kidney function or weight changes by a significant amount or as you get older.  What do you do if you miss a dose? If you miss a dose, take it as soon as you remember on the same day and resume taking twice daily.  Do not take more than one dose of ELIQUIS at the same time to make up a missed dose.  Important Safety Information A possible side effect of Eliquis is bleeding. You should call your healthcare provider right away if you experience any of the following: ? Bleeding from an injury or your nose that does not stop. ? Unusual colored urine (red or dark brown) or unusual colored stools (red or black). ? Unusual bruising for unknown reasons. ? A serious fall or if you hit your head (even if there is no bleeding).  Some medicines may interact with Eliquis and might increase your risk of bleeding or clotting while on Eliquis. To help avoid this, consult your healthcare provider or pharmacist prior to using any new prescription or non-prescription medications, including herbals, vitamins, non-steroidal anti-inflammatory drugs (NSAIDs) and supplements.  This website has more information on Eliquis (apixaban): http://www.eliquis.com/eliquis/home

## 2017-12-04 NOTE — Progress Notes (Signed)
Social Work  Discharge Note  The overall goal for the admission was met for:   Discharge location: Yes-HOME WITH WIFE WHO CAN PROVIDE 24 HR SUPERVISION  Length of Stay: Yes-14 DAYS  Discharge activity level: Yes-SUPERVISION-CGA LEVEL  Home/community participation: Yes  Services provided included: MD, RD, PT, OT, SLP, RN, CM, TR, Pharmacy, Neuropsych and SW  Financial Services: Private Insurance: [employer]  Follow-up services arranged: Home Health: [facility], DME: [facility] and Patient/Family has no preference for HH/DME agenciesREFERRAL MADE TO [facility] DUE TO [facility]T [facility]. CONTACTED THEM TO INFORM THEM.  Comments (or additional information):WIFE STAYED HERE WITH PT AND LEARNED HIS CARE AS HE WENT ALONG. BOTH READY FOR DC  Patient/Family verbalized understanding of follow-up arrangements: Yes  Individual responsible for coordination of the follow-up plan: WILMA-WIFE  Confirmed correct DME delivered: Elease Hashimoto 12/04/2017    Elease Hashimoto

## 2017-12-04 NOTE — Progress Notes (Signed)
Pt. Got d/c instructions,follow up appointments and equipment.Pt. Is ready to go home with family.

## 2017-12-05 DIAGNOSIS — J449 Chronic obstructive pulmonary disease, unspecified: Secondary | ICD-10-CM | POA: Diagnosis not present

## 2017-12-05 DIAGNOSIS — I482 Chronic atrial fibrillation, unspecified: Secondary | ICD-10-CM | POA: Diagnosis not present

## 2017-12-05 DIAGNOSIS — I110 Hypertensive heart disease with heart failure: Secondary | ICD-10-CM | POA: Diagnosis not present

## 2017-12-05 DIAGNOSIS — D649 Anemia, unspecified: Secondary | ICD-10-CM | POA: Diagnosis not present

## 2017-12-05 DIAGNOSIS — E119 Type 2 diabetes mellitus without complications: Secondary | ICD-10-CM | POA: Diagnosis not present

## 2017-12-05 DIAGNOSIS — I5041 Acute combined systolic (congestive) and diastolic (congestive) heart failure: Secondary | ICD-10-CM | POA: Diagnosis not present

## 2017-12-05 DIAGNOSIS — K219 Gastro-esophageal reflux disease without esophagitis: Secondary | ICD-10-CM | POA: Diagnosis not present

## 2017-12-05 DIAGNOSIS — I69354 Hemiplegia and hemiparesis following cerebral infarction affecting left non-dominant side: Secondary | ICD-10-CM | POA: Diagnosis not present

## 2017-12-06 ENCOUNTER — Telehealth: Payer: Self-pay | Admitting: Registered Nurse

## 2017-12-06 ENCOUNTER — Telehealth: Payer: Self-pay | Admitting: *Deleted

## 2017-12-06 NOTE — Telephone Encounter (Signed)
Kristin from [facility] called for PT orders 2wk4.  Approval given.

## 2017-12-06 NOTE — Telephone Encounter (Signed)
Transitional Care call  Patient name: Chad Franklin DOB: 1934/12/06 1. Are you/is patient experiencing any problems since coming home? No a. Are there any questions regarding any aspect of care? No 2. Are there any questions regarding medications administration/dosing? No a. Are meds being taken as prescribed? Yes b. "Patient should review meds with caller to confirm"  Medication List Reviewed. 3. Have there been any falls? No 4. Has Home Health been to the house and/or have they contacted you? Yes, [facility] a. If not, have you tried to contact them? NA b. Can we help you contact them? NA 5. Are bowels and bladder emptying properly? Bladder yes, hasn't moved his bowels yet, [name] reports. Encouraged to continue to monitor.  a. Are there any unexpected incontinence issues? NA b. If applicable, is patient following bowel/bladder programs? NA 6. Any fevers, problems with breathing, unexpected pain? No 7. Are there any skin problems or new areas of breakdown? No 8. Has the patient/family member arranged specialty MD follow up (ie cardiology/neurology/renal/surgical/etc.)?  Yes, all appointments have been scheduled.  a. Can we help arrange? NA 9. Does the patient need any other services or support that we can help arrange? No 10. Are caregivers following through as expected in assisting the patient? Yes 11. Has the patient quit smoking, drinking alcohol, or using drugs as recommended? [name] reports [name] doesn't smoke, drink alcohol or use illicit drugs.   Appointment date/time 12/18/2017, arrival time 11:40 for 12:00 appointment with Dr. Allena Katz. At [address] [facility] [address]

## 2017-12-09 DIAGNOSIS — I69354 Hemiplegia and hemiparesis following cerebral infarction affecting left non-dominant side: Secondary | ICD-10-CM | POA: Diagnosis not present

## 2017-12-09 DIAGNOSIS — J449 Chronic obstructive pulmonary disease, unspecified: Secondary | ICD-10-CM | POA: Diagnosis not present

## 2017-12-09 DIAGNOSIS — I5041 Acute combined systolic (congestive) and diastolic (congestive) heart failure: Secondary | ICD-10-CM | POA: Diagnosis not present

## 2017-12-09 DIAGNOSIS — K219 Gastro-esophageal reflux disease without esophagitis: Secondary | ICD-10-CM | POA: Diagnosis not present

## 2017-12-09 DIAGNOSIS — D649 Anemia, unspecified: Secondary | ICD-10-CM | POA: Diagnosis not present

## 2017-12-09 DIAGNOSIS — I110 Hypertensive heart disease with heart failure: Secondary | ICD-10-CM | POA: Diagnosis not present

## 2017-12-09 DIAGNOSIS — I482 Chronic atrial fibrillation, unspecified: Secondary | ICD-10-CM | POA: Diagnosis not present

## 2017-12-09 DIAGNOSIS — E119 Type 2 diabetes mellitus without complications: Secondary | ICD-10-CM | POA: Diagnosis not present

## 2017-12-10 ENCOUNTER — Telehealth: Payer: Self-pay

## 2017-12-10 NOTE — Telephone Encounter (Signed)
Harriett Sine, OT/[facility] called requesting verbal orders for 1wk1, 2wk4. Orders approved per discharge summary.

## 2017-12-16 DIAGNOSIS — I482 Chronic atrial fibrillation, unspecified: Secondary | ICD-10-CM | POA: Diagnosis not present

## 2017-12-16 DIAGNOSIS — E119 Type 2 diabetes mellitus without complications: Secondary | ICD-10-CM | POA: Diagnosis not present

## 2017-12-16 DIAGNOSIS — I69354 Hemiplegia and hemiparesis following cerebral infarction affecting left non-dominant side: Secondary | ICD-10-CM | POA: Diagnosis not present

## 2017-12-16 DIAGNOSIS — I5041 Acute combined systolic (congestive) and diastolic (congestive) heart failure: Secondary | ICD-10-CM | POA: Diagnosis not present

## 2017-12-16 DIAGNOSIS — D649 Anemia, unspecified: Secondary | ICD-10-CM | POA: Diagnosis not present

## 2017-12-16 DIAGNOSIS — J449 Chronic obstructive pulmonary disease, unspecified: Secondary | ICD-10-CM | POA: Diagnosis not present

## 2017-12-16 DIAGNOSIS — I110 Hypertensive heart disease with heart failure: Secondary | ICD-10-CM | POA: Diagnosis not present

## 2017-12-16 DIAGNOSIS — K219 Gastro-esophageal reflux disease without esophagitis: Secondary | ICD-10-CM | POA: Diagnosis not present

## 2017-12-17 DIAGNOSIS — I509 Heart failure, unspecified: Secondary | ICD-10-CM | POA: Diagnosis not present

## 2017-12-17 DIAGNOSIS — I69359 Hemiplegia and hemiparesis following cerebral infarction affecting unspecified side: Secondary | ICD-10-CM | POA: Diagnosis not present

## 2017-12-17 DIAGNOSIS — Z6832 Body mass index (BMI) 32.0-32.9, adult: Secondary | ICD-10-CM | POA: Diagnosis not present

## 2017-12-17 DIAGNOSIS — E118 Type 2 diabetes mellitus with unspecified complications: Secondary | ICD-10-CM | POA: Diagnosis not present

## 2017-12-17 DIAGNOSIS — E782 Mixed hyperlipidemia: Secondary | ICD-10-CM | POA: Diagnosis not present

## 2017-12-17 DIAGNOSIS — I4891 Unspecified atrial fibrillation: Secondary | ICD-10-CM | POA: Diagnosis not present

## 2017-12-17 DIAGNOSIS — I10 Essential (primary) hypertension: Secondary | ICD-10-CM | POA: Diagnosis not present

## 2017-12-17 DIAGNOSIS — Z79899 Other long term (current) drug therapy: Secondary | ICD-10-CM | POA: Diagnosis not present

## 2017-12-18 ENCOUNTER — Encounter: Payer: Medicare HMO | Admitting: Physical Medicine & Rehabilitation

## 2017-12-18 DIAGNOSIS — I69354 Hemiplegia and hemiparesis following cerebral infarction affecting left non-dominant side: Secondary | ICD-10-CM | POA: Diagnosis not present

## 2017-12-18 DIAGNOSIS — I110 Hypertensive heart disease with heart failure: Secondary | ICD-10-CM | POA: Diagnosis not present

## 2017-12-18 DIAGNOSIS — J449 Chronic obstructive pulmonary disease, unspecified: Secondary | ICD-10-CM | POA: Diagnosis not present

## 2017-12-18 DIAGNOSIS — K219 Gastro-esophageal reflux disease without esophagitis: Secondary | ICD-10-CM | POA: Diagnosis not present

## 2017-12-18 DIAGNOSIS — E119 Type 2 diabetes mellitus without complications: Secondary | ICD-10-CM | POA: Diagnosis not present

## 2017-12-18 DIAGNOSIS — D649 Anemia, unspecified: Secondary | ICD-10-CM | POA: Diagnosis not present

## 2017-12-18 DIAGNOSIS — I482 Chronic atrial fibrillation, unspecified: Secondary | ICD-10-CM | POA: Diagnosis not present

## 2017-12-18 DIAGNOSIS — I5041 Acute combined systolic (congestive) and diastolic (congestive) heart failure: Secondary | ICD-10-CM | POA: Diagnosis not present

## 2017-12-19 ENCOUNTER — Encounter: Payer: Self-pay | Admitting: Cardiology

## 2017-12-19 ENCOUNTER — Encounter: Payer: Medicare HMO | Admitting: Physical Medicine & Rehabilitation

## 2017-12-19 ENCOUNTER — Ambulatory Visit: Payer: Medicare HMO | Admitting: Cardiology

## 2017-12-19 VITALS — BP 114/66 | HR 83 | Ht 69.0 in | Wt 221.2 lb

## 2017-12-19 DIAGNOSIS — I5022 Chronic systolic (congestive) heart failure: Secondary | ICD-10-CM

## 2017-12-19 DIAGNOSIS — E785 Hyperlipidemia, unspecified: Secondary | ICD-10-CM | POA: Insufficient documentation

## 2017-12-19 DIAGNOSIS — Z8673 Personal history of transient ischemic attack (TIA), and cerebral infarction without residual deficits: Secondary | ICD-10-CM | POA: Diagnosis not present

## 2017-12-19 DIAGNOSIS — I4821 Permanent atrial fibrillation: Secondary | ICD-10-CM | POA: Diagnosis not present

## 2017-12-19 NOTE — Patient Instructions (Signed)
Medication Instructions: Your Physician recommend you make the following changes to your medication. -Stop: Digoxin 0.125 mg   If you need a refill on your cardiac medications before your next appointment, please call your pharmacy.   Labwork: None  Procedures/Testing: Your physician has requested that you have an echocardiogram. Echocardiography is a painless test that uses sound waves to create images of your heart. It provides your doctor with information about the size and shape of your heart and how well your heart's chambers and valves are working. This procedure takes approximately one hour. There are no restrictions for this procedure. [address]   Follow-Up: Your physician wants you to follow-up in 3 months with Dr. Cristal Deer. Special Instructions:    Thank you for choosing [facility]!!

## 2017-12-19 NOTE — Progress Notes (Signed)
Cardiology Office Note:    Date:  12/19/2017   ID:  Chad Franklin, DOB [birth year], MRN [id number]  PCP:  Lonie Peak, PA-C  Cardiologist:  Jodelle Red, MD PhD  Referring MD: Lonie Peak, PA-C   CC: Post hospital follow up  History of Present Illness:    Chad Franklin is a 83 y.o. male with a hx of recent CVA (embolic, INR not therapeutic on coumadin despite compliance), type II diabetes, COPD, atrial fibrillation now on apixaban who is seen as a new consult at the request of Lonie Peak, PA-C for the evaluation and management of atrial fibrillation and systolic heart failure.  He has been doing very well since hospital discharge. No chest pain, shortness of breath, palpitations, edema, or syncope. He doesn't weigh himself routinely. He is tolerating his medications well. Doing home physical therapy post stroke. He thinks his heart rate and blood pressure are checked routinely by the home nurse, and he thinks the numbers have always been good. No bruising or bleeding issues. Tolerating apixaban well. Knows not to miss doses.  His daughter and wife are here today. Reviewed all cardiac workup together, answered all questions about treatment and medications.  Past Medical History:  Diagnosis Date  . Bilateral swelling of feet   . COPD (chronic obstructive pulmonary disease) (HCC)   . Depression   . Diabetes mellitus   . GERD (gastroesophageal reflux disease)   . Palpitations   . Stroke [facility])    2    Past Surgical History:  Procedure Laterality Date  . GALLBLADDER SURGERY    . HERNIA REPAIR      Current Medications: Current Outpatient Medications on File Prior to Visit  Medication Sig  . acetaminophen (TYLENOL) 500 MG tablet Take 1 tablet (500 mg total) by mouth every 8 (eight) hours as needed.  Marland Kitchen apixaban (ELIQUIS) 5 MG TABS tablet Take 1 tablet (5 mg total) by mouth 2 (two) times daily.  . carvedilol (COREG) 6.25 MG tablet Take 3 tablets (18.75 mg total)  by mouth 2 (two) times daily with a meal.  . digoxin (LANOXIN) 0.125 MG tablet Take 1 tablet (0.125 mg total) by mouth daily.  . furosemide (LASIX) 20 MG tablet Take 2 tablets (40 mg total) by mouth daily.  Marland Kitchen ipratropium (ATROVENT) 0.02 % nebulizer solution Take 2.5 mLs (0.5 mg total) by nebulization every 6 (six) hours as needed for wheezing or shortness of breath.  . levalbuterol (XOPENEX) 1.25 MG/0.5ML nebulizer solution Take 1.25 mg by nebulization every 6 (six) hours as needed for wheezing or shortness of breath.  . losartan (COZAAR) 25 MG tablet Take 1 tablet (25 mg total) by mouth daily.  . Melatonin 3 MG TABS Take 1 tablet (3 mg total) by mouth at bedtime.  . Multiple Vitamin (MULTIVITAMIN WITH MINERALS) TABS tablet Take 1 tablet by mouth daily.  Marland Kitchen PARoxetine (PAXIL) 20 MG tablet Take 1 tablet (20 mg total) by mouth daily.  . potassium chloride SA (K-DUR,KLOR-CON) 20 MEQ tablet Take 1 tablet (20 mEq total) by mouth daily.  . pravastatin (PRAVACHOL) 10 MG tablet Take 1 tablet (10 mg total) by mouth daily.  Marland Kitchen senna-docusate (SENOKOT-S) 8.6-50 MG tablet Take 1 tablet by mouth at bedtime as needed for mild constipation.   No current facility-administered medications on file prior to visit.      Allergies:   Naproxen sodium   Social History   Socioeconomic History  . Marital status: Married    Spouse name: Not on  file  . Number of children: Not on file  . Years of education: Not on file  . Highest education level: Not on file  Occupational History  . Not on file  Social Needs  . Financial resource strain: Not on file  . Food insecurity:    Worry: Not on file    Inability: Not on file  . Transportation needs:    Medical: Not on file    Non-medical: Not on file  Tobacco Use  . Smoking status: Former Games developer  . Smokeless tobacco: Never Used  Substance and Sexual Activity  . Alcohol use: No    Comment: past drinker  . Drug use: Not on file  . Sexual activity: Not on file    Lifestyle  . Physical activity:    Days per week: Not on file    Minutes per session: Not on file  . Stress: Not on file  Relationships  . Social connections:    Talks on phone: Not on file    Gets together: Not on file    Attends religious service: Not on file    Active member of club or organization: Not on file    Attends meetings of clubs or organizations: Not on file    Relationship status: Not on file  Other Topics Concern  . Not on file  Social History Narrative  . Not on file     Family History: The patient's family history includes Hypertension in his father.  ROS:   Please see the history of present illness.  Additional pertinent ROS:  Constitutional: Negative for chills, fever, night sweats, unintentional weight loss  HENT: Negative for ear pain and hearing loss.   Eyes: Negative for loss of vision and eye pain.  Respiratory: Negative for cough, sputum, shortness of breath, wheezing.   Cardiovascular: Negative for chest pain, palpitations , PND, orthopnea, lower extremity edema and claudication.  Gastrointestinal: Negative for abdominal pain, melena, and hematochezia.  Genitourinary: Negative for dysuria and hematuria.  Musculoskeletal: Negative for falls and myalgias.  Skin: Negative for itching and rash.  Neurological: Negative for focal weakness, focal sensory changes and loss of consciousness.  Endo/Heme/Allergies: Does not bruise/bleed easily.    EKGs/Labs/Other Studies Reviewed:    The following studies were reviewed today: Myoview stress: 11/17/17  FINDINGS: Perfusion: Decreased counts within the apical, mid and basilar segment of the inferior wall which are fixed on rest and stress. Decreased counts in the apical segment of the inferolateral wall which are fixed on rest and stress. No evidence reversible ischemia.  Wall Motion: LEFT ventricle is dilated at rest and stress. Global hypokinesia.  Left Ventricular Ejection Fraction: 28 % End  diastolic volume 183 ml End systolic volume 131 ml  IMPRESSION: 1. No reversible ischemia. Fixed defect in the inferolateral wall may represent infarction. Decrease counts in the inferior wall are favored diaphragmatic attenuation.  2. Global hypokinesia. LEFT ventricular dilatation at rest and stress.  3. Left ventricular ejection fraction 28%  4. Non invasive risk stratification*: High (based on low ejection Fraction).  Echo 11/15/17 Study Conclusions  - Left ventricle: The cavity size was mildly dilated. There was mild focal basal hypertrophy of the septum. Systolic function was moderately reduced. The estimated ejection fraction was in the range of 35% to 40%. - Regional wall motion abnormality: Akinesis of the mid anterior and basal-mid anteroseptal myocardium; hypokinesis of the apical anterior, mid inferoseptal, apical inferior, apical septal, apical lateral, and apical myocardium. - Aortic valve: There was trivial  regurgitation. - Mitral valve: Calcified annulus. Mildly thickened leaflets . There was moderate regurgitation. - Left atrium: The atrium was massively dilated. - Right ventricle: The cavity size was moderately dilated. Wall thickness was normal. Systolic function was mildly reduced. - Right atrium: The atrium was moderately dilated. - Atrial septum: No defect or patent foramen ovale was identified. - Tricuspid valve: There was mild regurgitation. - Pulmonic valve: There was mild regurgitation. - Pulmonary arteries: Systolic pressure was moderately increased. PA peak pressure: 44 mm Hg (S).  Impressions:  - Reduced LVEF with wall motion abnormalities across the anteroseptal, anterior, inferoseptal, and apical myocardium. Consider multivessel CAD vs. takotsubo. Massively dilated LA with moderate MR. Elevated PASP of 44 mmHg.  EKG:  EKG is ordered today.  The ekg ordered today demonstrates atrial fibrillation  Recent  Labs: 11/21/2017: ALT 29 11/28/2017: BUN 21; Creatinine, Ser 0.80; Hemoglobin 12.8; Platelets 312; Potassium 4.6; Sodium 138  Recent Lipid Panel    Component Value Date/Time   CHOL 116 11/15/2017 0342   TRIG 54 11/15/2017 0342   HDL 39 (L) 11/15/2017 0342   CHOLHDL 3.0 11/15/2017 0342   VLDL 11 11/15/2017 0342   LDLCALC 66 11/15/2017 0342    Physical Exam:    VS:  BP 114/66   Pulse 83   Ht 5\' 9"  (1.753 m)   Wt 221 lb 3.2 oz (100.3 kg)   BMI 32.67 kg/m     Wt Readings from Last 3 Encounters:  12/19/17 221 lb 3.2 oz (100.3 kg)  12/04/17 231 lb 7.7 oz (105 kg)  11/14/17 230 lb 13.2 oz (104.7 kg)     GEN: Well nourished, well developed in no acute distress HEENT: Normal NECK: No JVD; No carotid bruits LYMPHATICS: No lymphadenopathy CARDIAC:irregularly irregular rhythm, normal S1 and S2, no murmurs, rubs, gallops. Radial and DP pulses 2+ bilaterally. RESPIRATORY:  Clear to auscultation without rales, wheezing or rhonchi  ABDOMEN: Soft, non-tender, non-distended MUSCULOSKELETAL:  No edema; No deformity  SKIN: Warm and dry NEUROLOGIC:  Alert and oriented x 3 PSYCHIATRIC:  Normal affect   ASSESSMENT:    1. Chronic systolic heart failure (HCC)   2. Permanent atrial fibrillation   3. Hyperlipidemia, unspecified hyperlipidemia type   4. History of cardioembolic cerebrovascular accident (CVA)    PLAN:    1. Permanent atrial fibrillation, with CVA on coumadin (INR subtherapeutic despite compliance), now on apixaban CHA2DS2/VAS Stroke Risk Points  Current as of 2 hours ago     7  >= 2 Points: High Risk  1 - 1.99 Points: Medium Risk  0 Points: Low Risk     Points Metrics  1 Has Congestive Heart Failure:  Yes    Current as of 2 hours ago  0 Has Vascular Disease:  No    Current as of 2 hours ago  1 Has Hypertension:  Yes    Current as of 2 hours ago  2 Age:  48    Current as of 2 hours ago  1 Has Diabetes:  Yes    Current as of 2 hours ago  2 Had Stroke:  Yes  Had TIA:   No  Had thromboembolism:  No    Current as of 2 hours ago  0 Male:  No    Current as of 2 hours ago    -with high CV score and history of stroke, continue apixaban, and he should be bridged if anticoagulation needed to be held for any procedure -rate control with carvedilol 6.25 mg  BID -unlikely to remain in sinus rhythm with cardioversion given the size of his atrium -given long term mortality risk with digoxin, will discontinue today. Asked him to monitor his heart rates. If they elevate, would increase carvedilol if blood pressure will tolerate. No CCB given low EF. Digoxin only as last resort for rate control.   2. Chronic systolic heart failure: new on admission in August. Was on 20 mg furosemide prior to admission, has tolerated 40 mg furosemide since discharge. Counseled on daily weights, salt, fluid restriction. He has had no swelling, SOB, PND, orthopnea. Weights stable to improved. Had labs drawn at PCP yesterday -continue furosemide 40 mg daily -continue carvedilol 6.25 mg BID -continue losartan 25 mg.  -recheck echo 3 mos after medical therapy. Uptitration limited by blood pressure. Suspect low EF may be at least partially tachycardia induced. Does have WMA but no ischemia on nuclear study.   3. Hyperlipidemia: continue pravastatin, at goal of LDL <70.    Plan for follow up: 3 mos  Medication Adjustments/Labs and Tests Ordered: Current medicines are reviewed at length with the patient today.  Concerns regarding medicines are outlined above.  Orders Placed This Encounter  Procedures  . EKG 12-Lead  . ECHOCARDIOGRAM COMPLETE   No orders of the defined types were placed in this encounter.   Patient Instructions  Medication Instructions: Your Physician recommend you make the following changes to your medication. -Stop: Digoxin 0.125 mg   If you need a refill on your cardiac medications before your next appointment, please call your pharmacy.    Labwork: None  Procedures/Testing: Your physician has requested that you have an echocardiogram. Echocardiography is a painless test that uses sound waves to create images of your heart. It provides your doctor with information about the size and shape of your heart and how well your heart's chambers and valves are working. This procedure takes approximately one hour. There are no restrictions for this procedure. [address]   Follow-Up: Your physician wants you to follow-up in 3 months with Dr. Cristal Deer. Special Instructions:    Thank you for choosing [facility] at [facility]!!       Signed, Jodelle Red, MD PhD 12/19/2017 1:33 PM    [facility]

## 2017-12-20 ENCOUNTER — Encounter: Payer: Self-pay | Admitting: Physical Medicine & Rehabilitation

## 2017-12-20 ENCOUNTER — Encounter: Payer: Medicare HMO | Attending: Physical Medicine & Rehabilitation | Admitting: Physical Medicine & Rehabilitation

## 2017-12-20 ENCOUNTER — Other Ambulatory Visit: Payer: Self-pay

## 2017-12-20 VITALS — BP 109/73 | HR 86 | Ht 68.5 in | Wt 221.6 lb

## 2017-12-20 DIAGNOSIS — I69354 Hemiplegia and hemiparesis following cerebral infarction affecting left non-dominant side: Secondary | ICD-10-CM

## 2017-12-20 DIAGNOSIS — G479 Sleep disorder, unspecified: Secondary | ICD-10-CM

## 2017-12-20 DIAGNOSIS — Z87891 Personal history of nicotine dependence: Secondary | ICD-10-CM | POA: Insufficient documentation

## 2017-12-20 DIAGNOSIS — K219 Gastro-esophageal reflux disease without esophagitis: Secondary | ICD-10-CM | POA: Insufficient documentation

## 2017-12-20 DIAGNOSIS — Z8249 Family history of ischemic heart disease and other diseases of the circulatory system: Secondary | ICD-10-CM | POA: Diagnosis not present

## 2017-12-20 DIAGNOSIS — F329 Major depressive disorder, single episode, unspecified: Secondary | ICD-10-CM | POA: Insufficient documentation

## 2017-12-20 DIAGNOSIS — Z7901 Long term (current) use of anticoagulants: Secondary | ICD-10-CM | POA: Diagnosis not present

## 2017-12-20 DIAGNOSIS — J449 Chronic obstructive pulmonary disease, unspecified: Secondary | ICD-10-CM | POA: Diagnosis not present

## 2017-12-20 DIAGNOSIS — Z8673 Personal history of transient ischemic attack (TIA), and cerebral infarction without residual deficits: Secondary | ICD-10-CM

## 2017-12-20 DIAGNOSIS — I4891 Unspecified atrial fibrillation: Secondary | ICD-10-CM | POA: Diagnosis not present

## 2017-12-20 DIAGNOSIS — I10 Essential (primary) hypertension: Secondary | ICD-10-CM | POA: Insufficient documentation

## 2017-12-20 DIAGNOSIS — R531 Weakness: Secondary | ICD-10-CM | POA: Insufficient documentation

## 2017-12-20 DIAGNOSIS — E119 Type 2 diabetes mellitus without complications: Secondary | ICD-10-CM | POA: Insufficient documentation

## 2017-12-20 NOTE — Progress Notes (Addendum)
Subjective:    Patient ID: Chad Franklin, male    DOB: [birth year], 83 y.o.   MRN: [id number]  HPI 83 year old male with history of COPD, diet-controlled DM, prior CVA, A. fib on Coumadin presents for transitional care management after receiving CIR for cortical/subcortical infarct in posterior right frontal lobe and remote left parieto-occipital infarct.  Admit date: 11/20/2017 Discharge date: 12/04/2017  At discharge, he was instructed to not to drive, which he has been complaint with. Wife present and supplements history. He saw Cards yesterday. He saw PCP. He does not have an appointment to see Neurology. He continues to wear thumb splint at night. BP is controlled.  Denies issues with breathing. Sleep has significantly improved. Denies falls.  Therapies: 2/week DME: Shower chair, bedside commode  Mobility: No assistive device needed.   Pain Inventory Average Pain 0 Pain Right Now 0 My pain is no pain  In the last 24 hours, has pain interfered with the following? General activity 0 Relation with others 0 Enjoyment of life 0 What TIME of day is your pain at its worst? no pain Sleep (in general) Good  Pain is worse with: no pain Pain improves with: no pain Relief from Meds: no meds  Mobility walk without assistance walk with assistance use a walker how many minutes can you walk? 10 ability to climb steps?  yes do you drive?  no  Function retired  Neuro/Psych No problems in this area  Prior Studies Any changes since last visit?  no  Physicians involved in your care Primary care Dr. Lonie Peak Dr. Jodelle Red   Family History  Problem Relation Age of Onset  . Hypertension Father    Social History   Socioeconomic History  . Marital status: Married    Spouse name: Not on file  . Number of children: Not on file  . Years of education: Not on file  . Highest education level: Not on file  Occupational History  . Not on file  Social Needs  .  Financial resource strain: Not on file  . Food insecurity:    Worry: Not on file    Inability: Not on file  . Transportation needs:    Medical: Not on file    Non-medical: Not on file  Tobacco Use  . Smoking status: Former Games developer  . Smokeless tobacco: Never Used  Substance and Sexual Activity  . Alcohol use: No    Comment: past drinker  . Drug use: Not on file  . Sexual activity: Not on file  Lifestyle  . Physical activity:    Days per week: Not on file    Minutes per session: Not on file  . Stress: Not on file  Relationships  . Social connections:    Talks on phone: Not on file    Gets together: Not on file    Attends religious service: Not on file    Active member of club or organization: Not on file    Attends meetings of clubs or organizations: Not on file    Relationship status: Not on file  Other Topics Concern  . Not on file  Social History Narrative  . Not on file   Past Surgical History:  Procedure Laterality Date  . GALLBLADDER SURGERY    . HERNIA REPAIR     Past Medical History:  Diagnosis Date  . Bilateral swelling of feet   . COPD (chronic obstructive pulmonary disease) (HCC)   . Depression   . Diabetes mellitus   .  GERD (gastroesophageal reflux disease)   . Palpitations   . Stroke (HCC)    2   BP 109/73   Pulse 86   Ht 5' 8.5" (1.74 m)   Wt 221 lb 9.6 oz (100.5 kg)   SpO2 92%   BMI 33.20 kg/m   Opioid Risk Score:   Fall Risk Score:  `1  Depression screen PHQ 2/9  Depression screen PHQ 2/9 12/20/2017  Decreased Interest 1  Down, Depressed, Hopeless 0  PHQ - 2 Score 1   Review of Systems  Constitutional: Positive for appetite change.  HENT: Negative.   Eyes: Negative.   Respiratory: Negative.   Cardiovascular: Negative.   Gastrointestinal: Negative.   Endocrine: Negative.   Genitourinary: Negative.   Musculoskeletal: Negative.   Skin: Negative.   Allergic/Immunologic: Negative.   Neurological: Positive for weakness. Negative  for dizziness and numbness.  Hematological: Negative.   Psychiatric/Behavioral: Negative.   All other systems reviewed and are negative.     Objective:   Physical Exam Constitutional: No distress . Vital signs reviewed. HENT: Normocephalic.  Atraumatic. Eyes: EOMI. No discharge. Cardiovascular: Irregularly irregular. No JVD. Respiratory: CTA bilaterally. Normal effort. GI: BS +. Non-distended. Musc: No edema or tenderness in extremities. Neurological: He is alert.  HOH Motor: Right upper extremity 5/5 proximal distal Right lower extremity: 5/5 proximal distal Left upper extremity: 4-4+/5 shoulder abduction, elbow flexion/extension, 3/5 hand grip, except for thumb Left lower extremity: 4-4+/5 proximal to distal Skin: Skin is warm and dry.  Psychiatric: Normal mood.  Normal behavior.      Assessment & Plan:  83 year old male with history of COPD, diet-controlled DM, prior CVA, A. fib on Coumadin presents for transitional care management after receiving CIR for cortical/subcortical infarct in posterior right frontal lobe and remote left parieto-occipital infarct.  1. Left-sided weakness as well as cognitive deficits  secondary to small acute cortical/subcortical infarct and posterior right frontal lobe and remote left parieto-occipital infarct.             Cont therapies  Follow up with Neurology  Cont resting hand splint ordered for thumb  Discussed driving restrictions until cleared by physician            2. HTN:   Cont meds  Relatively controlled at present  3. COPD  Relatively controlled at present  4. A fib with RVR:   Cont meds  Follow up with Cards  5. Sleep disturbance:  Improved off medicaitons now  Meds reviewed Referrals reviewed - needs Neurology appointment All questions answered

## 2017-12-20 NOTE — Patient Instructions (Signed)
Address: [address], [place], [place] [address]  Phone: [phone]  [facility]

## 2017-12-23 DIAGNOSIS — I482 Chronic atrial fibrillation, unspecified: Secondary | ICD-10-CM | POA: Diagnosis not present

## 2017-12-23 DIAGNOSIS — K219 Gastro-esophageal reflux disease without esophagitis: Secondary | ICD-10-CM | POA: Diagnosis not present

## 2017-12-23 DIAGNOSIS — I69354 Hemiplegia and hemiparesis following cerebral infarction affecting left non-dominant side: Secondary | ICD-10-CM | POA: Diagnosis not present

## 2017-12-23 DIAGNOSIS — I110 Hypertensive heart disease with heart failure: Secondary | ICD-10-CM | POA: Diagnosis not present

## 2017-12-23 DIAGNOSIS — I5041 Acute combined systolic (congestive) and diastolic (congestive) heart failure: Secondary | ICD-10-CM | POA: Diagnosis not present

## 2017-12-23 DIAGNOSIS — E119 Type 2 diabetes mellitus without complications: Secondary | ICD-10-CM | POA: Diagnosis not present

## 2017-12-23 DIAGNOSIS — D649 Anemia, unspecified: Secondary | ICD-10-CM | POA: Diagnosis not present

## 2017-12-23 DIAGNOSIS — J449 Chronic obstructive pulmonary disease, unspecified: Secondary | ICD-10-CM | POA: Diagnosis not present

## 2017-12-25 DIAGNOSIS — E119 Type 2 diabetes mellitus without complications: Secondary | ICD-10-CM | POA: Diagnosis not present

## 2017-12-25 DIAGNOSIS — D649 Anemia, unspecified: Secondary | ICD-10-CM | POA: Diagnosis not present

## 2017-12-25 DIAGNOSIS — K219 Gastro-esophageal reflux disease without esophagitis: Secondary | ICD-10-CM | POA: Diagnosis not present

## 2017-12-25 DIAGNOSIS — I110 Hypertensive heart disease with heart failure: Secondary | ICD-10-CM | POA: Diagnosis not present

## 2017-12-25 DIAGNOSIS — I5041 Acute combined systolic (congestive) and diastolic (congestive) heart failure: Secondary | ICD-10-CM | POA: Diagnosis not present

## 2017-12-25 DIAGNOSIS — I482 Chronic atrial fibrillation, unspecified: Secondary | ICD-10-CM | POA: Diagnosis not present

## 2017-12-25 DIAGNOSIS — J449 Chronic obstructive pulmonary disease, unspecified: Secondary | ICD-10-CM | POA: Diagnosis not present

## 2017-12-25 DIAGNOSIS — I69354 Hemiplegia and hemiparesis following cerebral infarction affecting left non-dominant side: Secondary | ICD-10-CM | POA: Diagnosis not present

## 2017-12-26 DIAGNOSIS — K219 Gastro-esophageal reflux disease without esophagitis: Secondary | ICD-10-CM | POA: Diagnosis not present

## 2017-12-26 DIAGNOSIS — I5041 Acute combined systolic (congestive) and diastolic (congestive) heart failure: Secondary | ICD-10-CM | POA: Diagnosis not present

## 2017-12-26 DIAGNOSIS — D649 Anemia, unspecified: Secondary | ICD-10-CM | POA: Diagnosis not present

## 2017-12-26 DIAGNOSIS — I110 Hypertensive heart disease with heart failure: Secondary | ICD-10-CM | POA: Diagnosis not present

## 2017-12-26 DIAGNOSIS — J449 Chronic obstructive pulmonary disease, unspecified: Secondary | ICD-10-CM | POA: Diagnosis not present

## 2017-12-26 DIAGNOSIS — I482 Chronic atrial fibrillation, unspecified: Secondary | ICD-10-CM | POA: Diagnosis not present

## 2017-12-26 DIAGNOSIS — I69354 Hemiplegia and hemiparesis following cerebral infarction affecting left non-dominant side: Secondary | ICD-10-CM | POA: Diagnosis not present

## 2017-12-26 DIAGNOSIS — E119 Type 2 diabetes mellitus without complications: Secondary | ICD-10-CM | POA: Diagnosis not present

## 2017-12-30 DIAGNOSIS — I110 Hypertensive heart disease with heart failure: Secondary | ICD-10-CM | POA: Diagnosis not present

## 2017-12-30 DIAGNOSIS — J449 Chronic obstructive pulmonary disease, unspecified: Secondary | ICD-10-CM | POA: Diagnosis not present

## 2017-12-30 DIAGNOSIS — K219 Gastro-esophageal reflux disease without esophagitis: Secondary | ICD-10-CM | POA: Diagnosis not present

## 2017-12-30 DIAGNOSIS — I69354 Hemiplegia and hemiparesis following cerebral infarction affecting left non-dominant side: Secondary | ICD-10-CM | POA: Diagnosis not present

## 2017-12-30 DIAGNOSIS — E119 Type 2 diabetes mellitus without complications: Secondary | ICD-10-CM | POA: Diagnosis not present

## 2017-12-30 DIAGNOSIS — I482 Chronic atrial fibrillation, unspecified: Secondary | ICD-10-CM | POA: Diagnosis not present

## 2017-12-30 DIAGNOSIS — D649 Anemia, unspecified: Secondary | ICD-10-CM | POA: Diagnosis not present

## 2017-12-30 DIAGNOSIS — I5041 Acute combined systolic (congestive) and diastolic (congestive) heart failure: Secondary | ICD-10-CM | POA: Diagnosis not present

## 2017-12-31 DIAGNOSIS — K219 Gastro-esophageal reflux disease without esophagitis: Secondary | ICD-10-CM | POA: Diagnosis not present

## 2017-12-31 DIAGNOSIS — I482 Chronic atrial fibrillation, unspecified: Secondary | ICD-10-CM | POA: Diagnosis not present

## 2017-12-31 DIAGNOSIS — E119 Type 2 diabetes mellitus without complications: Secondary | ICD-10-CM | POA: Diagnosis not present

## 2017-12-31 DIAGNOSIS — D649 Anemia, unspecified: Secondary | ICD-10-CM | POA: Diagnosis not present

## 2017-12-31 DIAGNOSIS — I110 Hypertensive heart disease with heart failure: Secondary | ICD-10-CM | POA: Diagnosis not present

## 2017-12-31 DIAGNOSIS — I5041 Acute combined systolic (congestive) and diastolic (congestive) heart failure: Secondary | ICD-10-CM | POA: Diagnosis not present

## 2017-12-31 DIAGNOSIS — J449 Chronic obstructive pulmonary disease, unspecified: Secondary | ICD-10-CM | POA: Diagnosis not present

## 2017-12-31 DIAGNOSIS — I69354 Hemiplegia and hemiparesis following cerebral infarction affecting left non-dominant side: Secondary | ICD-10-CM | POA: Diagnosis not present

## 2018-01-01 DIAGNOSIS — K219 Gastro-esophageal reflux disease without esophagitis: Secondary | ICD-10-CM | POA: Diagnosis not present

## 2018-01-01 DIAGNOSIS — D649 Anemia, unspecified: Secondary | ICD-10-CM | POA: Diagnosis not present

## 2018-01-01 DIAGNOSIS — I110 Hypertensive heart disease with heart failure: Secondary | ICD-10-CM | POA: Diagnosis not present

## 2018-01-01 DIAGNOSIS — I482 Chronic atrial fibrillation, unspecified: Secondary | ICD-10-CM | POA: Diagnosis not present

## 2018-01-01 DIAGNOSIS — I5041 Acute combined systolic (congestive) and diastolic (congestive) heart failure: Secondary | ICD-10-CM | POA: Diagnosis not present

## 2018-01-01 DIAGNOSIS — J449 Chronic obstructive pulmonary disease, unspecified: Secondary | ICD-10-CM | POA: Diagnosis not present

## 2018-01-01 DIAGNOSIS — I69354 Hemiplegia and hemiparesis following cerebral infarction affecting left non-dominant side: Secondary | ICD-10-CM | POA: Diagnosis not present

## 2018-01-01 DIAGNOSIS — E119 Type 2 diabetes mellitus without complications: Secondary | ICD-10-CM | POA: Diagnosis not present

## 2018-01-06 DIAGNOSIS — I110 Hypertensive heart disease with heart failure: Secondary | ICD-10-CM | POA: Diagnosis not present

## 2018-01-06 DIAGNOSIS — D649 Anemia, unspecified: Secondary | ICD-10-CM | POA: Diagnosis not present

## 2018-01-06 DIAGNOSIS — I5041 Acute combined systolic (congestive) and diastolic (congestive) heart failure: Secondary | ICD-10-CM | POA: Diagnosis not present

## 2018-01-06 DIAGNOSIS — I482 Chronic atrial fibrillation, unspecified: Secondary | ICD-10-CM | POA: Diagnosis not present

## 2018-01-06 DIAGNOSIS — K219 Gastro-esophageal reflux disease without esophagitis: Secondary | ICD-10-CM | POA: Diagnosis not present

## 2018-01-06 DIAGNOSIS — J449 Chronic obstructive pulmonary disease, unspecified: Secondary | ICD-10-CM | POA: Diagnosis not present

## 2018-01-06 DIAGNOSIS — I69354 Hemiplegia and hemiparesis following cerebral infarction affecting left non-dominant side: Secondary | ICD-10-CM | POA: Diagnosis not present

## 2018-01-06 DIAGNOSIS — E119 Type 2 diabetes mellitus without complications: Secondary | ICD-10-CM | POA: Diagnosis not present

## 2018-01-10 DIAGNOSIS — I110 Hypertensive heart disease with heart failure: Secondary | ICD-10-CM | POA: Diagnosis not present

## 2018-01-10 DIAGNOSIS — E119 Type 2 diabetes mellitus without complications: Secondary | ICD-10-CM | POA: Diagnosis not present

## 2018-01-10 DIAGNOSIS — J449 Chronic obstructive pulmonary disease, unspecified: Secondary | ICD-10-CM | POA: Diagnosis not present

## 2018-01-10 DIAGNOSIS — I5041 Acute combined systolic (congestive) and diastolic (congestive) heart failure: Secondary | ICD-10-CM | POA: Diagnosis not present

## 2018-01-10 DIAGNOSIS — D649 Anemia, unspecified: Secondary | ICD-10-CM | POA: Diagnosis not present

## 2018-01-10 DIAGNOSIS — I69354 Hemiplegia and hemiparesis following cerebral infarction affecting left non-dominant side: Secondary | ICD-10-CM | POA: Diagnosis not present

## 2018-01-10 DIAGNOSIS — K219 Gastro-esophageal reflux disease without esophagitis: Secondary | ICD-10-CM | POA: Diagnosis not present

## 2018-01-10 DIAGNOSIS — I482 Chronic atrial fibrillation, unspecified: Secondary | ICD-10-CM | POA: Diagnosis not present

## 2018-01-24 DIAGNOSIS — R531 Weakness: Secondary | ICD-10-CM | POA: Diagnosis not present

## 2018-01-24 DIAGNOSIS — I509 Heart failure, unspecified: Secondary | ICD-10-CM | POA: Diagnosis not present

## 2018-01-24 DIAGNOSIS — I4891 Unspecified atrial fibrillation: Secondary | ICD-10-CM | POA: Diagnosis not present

## 2018-01-29 DIAGNOSIS — Z6833 Body mass index (BMI) 33.0-33.9, adult: Secondary | ICD-10-CM | POA: Diagnosis not present

## 2018-01-29 DIAGNOSIS — I509 Heart failure, unspecified: Secondary | ICD-10-CM | POA: Diagnosis not present

## 2018-01-29 DIAGNOSIS — I4891 Unspecified atrial fibrillation: Secondary | ICD-10-CM | POA: Diagnosis not present

## 2018-02-04 ENCOUNTER — Other Ambulatory Visit: Payer: Self-pay

## 2018-02-04 NOTE — Patient Outreach (Signed)
First attempt to obtain mRs. No answer, no voicemail. Number listed for spouse is the same number. Will try daughter Chad Franklin (on [place]) if no answer next time.

## 2018-02-10 ENCOUNTER — Other Ambulatory Visit: Payer: Self-pay

## 2018-02-10 NOTE — Patient Outreach (Signed)
Second attempt to obtain mRs. No answer. Left message for return call on Brenda's number, daughter, listed on DPR.

## 2018-02-10 NOTE — Patient Outreach (Signed)
Telephone outreach to patient to obtain mRs was successfully completed. mRs= 1. 

## 2018-02-21 ENCOUNTER — Encounter: Payer: Medicare HMO | Attending: Physical Medicine & Rehabilitation | Admitting: Physical Medicine & Rehabilitation

## 2018-02-21 ENCOUNTER — Encounter: Payer: Self-pay | Admitting: Physical Medicine & Rehabilitation

## 2018-02-21 ENCOUNTER — Other Ambulatory Visit: Payer: Self-pay

## 2018-02-21 VITALS — BP 144/80 | HR 101 | Ht 68.5 in | Wt 236.2 lb

## 2018-02-21 DIAGNOSIS — Z8673 Personal history of transient ischemic attack (TIA), and cerebral infarction without residual deficits: Secondary | ICD-10-CM | POA: Insufficient documentation

## 2018-02-21 DIAGNOSIS — Z7901 Long term (current) use of anticoagulants: Secondary | ICD-10-CM | POA: Diagnosis not present

## 2018-02-21 DIAGNOSIS — Z8249 Family history of ischemic heart disease and other diseases of the circulatory system: Secondary | ICD-10-CM | POA: Insufficient documentation

## 2018-02-21 DIAGNOSIS — J449 Chronic obstructive pulmonary disease, unspecified: Secondary | ICD-10-CM | POA: Insufficient documentation

## 2018-02-21 DIAGNOSIS — Z87891 Personal history of nicotine dependence: Secondary | ICD-10-CM | POA: Diagnosis not present

## 2018-02-21 DIAGNOSIS — E119 Type 2 diabetes mellitus without complications: Secondary | ICD-10-CM | POA: Diagnosis not present

## 2018-02-21 DIAGNOSIS — I10 Essential (primary) hypertension: Secondary | ICD-10-CM | POA: Diagnosis not present

## 2018-02-21 DIAGNOSIS — K219 Gastro-esophageal reflux disease without esophagitis: Secondary | ICD-10-CM | POA: Diagnosis not present

## 2018-02-21 DIAGNOSIS — R531 Weakness: Secondary | ICD-10-CM | POA: Insufficient documentation

## 2018-02-21 DIAGNOSIS — I4891 Unspecified atrial fibrillation: Secondary | ICD-10-CM | POA: Diagnosis not present

## 2018-02-21 DIAGNOSIS — F329 Major depressive disorder, single episode, unspecified: Secondary | ICD-10-CM | POA: Diagnosis not present

## 2018-02-21 NOTE — Patient Instructions (Signed)
[facility]  Address: [address], [place], [place] [address]  Phone: [phone]  

## 2018-02-21 NOTE — Progress Notes (Signed)
Subjective:    Patient ID: Chad Franklin, male    DOB: [birth year], 83 y.o.   MRN: [id number]  HPI 83 year old male with history of COPD, diet-controlled DM, prior CVA, A. fib on Coumadin presents for follow up for cortical/subcortical infarct in posterior right frontal lobe and remote left parieto-occipital infarct.  Last clinic visit 12/20/17.  Wife supplements history. Since that time, pt completed therapies.  Pt never followed up with Neurology. He is wearing splint at night.  Blood pressure slightly elevated. He is following up with Cards. Denies falls.  Pain Inventory Average Pain 0 Pain Right Now 0 My pain is no pain  In the last 24 hours, has pain interfered with the following? General activity 5 Relation with others 5 Enjoyment of life 5 What TIME of day is your pain at its worst? morning Sleep (in general) Fair  Pain is worse with: inactivity Pain improves with: medication Relief from Meds: 5  Mobility walk without assistance walk with assistance  Function retired  Neuro/Psych weakness  Prior Studies Any changes since last visit?  no  Physicians involved in your care Primary care Dr. Lonie Peak Dr. Jodelle Red   Family History  Problem Relation Age of Onset  . Hypertension Father    Social History   Socioeconomic History  . Marital status: Married    Spouse name: Not on file  . Number of children: Not on file  . Years of education: Not on file  . Highest education level: Not on file  Occupational History  . Not on file  Social Needs  . Financial resource strain: Not on file  . Food insecurity:    Worry: Not on file    Inability: Not on file  . Transportation needs:    Medical: Not on file    Non-medical: Not on file  Tobacco Use  . Smoking status: Former Games developer  . Smokeless tobacco: Never Used  Substance and Sexual Activity  . Alcohol use: No    Comment: past drinker  . Drug use: Not on file  . Sexual activity: Not on  file  Lifestyle  . Physical activity:    Days per week: Not on file    Minutes per session: Not on file  . Stress: Not on file  Relationships  . Social connections:    Talks on phone: Not on file    Gets together: Not on file    Attends religious service: Not on file    Active member of club or organization: Not on file    Attends meetings of clubs or organizations: Not on file    Relationship status: Not on file  Other Topics Concern  . Not on file  Social History Narrative  . Not on file   Past Surgical History:  Procedure Laterality Date  . GALLBLADDER SURGERY    . HERNIA REPAIR     Past Medical History:  Diagnosis Date  . Bilateral swelling of feet   . COPD (chronic obstructive pulmonary disease) (HCC)   . Depression   . Diabetes mellitus   . GERD (gastroesophageal reflux disease)   . Palpitations   . Stroke (HCC)    2   BP (!) 144/80   Pulse (!) 101   Ht 5' 8.5" (1.74 m)   Wt 236 lb 3.2 oz (107.1 kg)   SpO2 95%   BMI 35.39 kg/m   Opioid Risk Score:   Fall Risk Score:  `1  Depression screen [facility] 2/9  Depression screen [facility] 2/9 02/21/2018 12/20/2017  Decreased Interest 0 1  Down, Depressed, Hopeless 0 0  PHQ - 2 Score 0 1   Review of Systems  Constitutional: Positive for appetite change.  HENT: Negative.   Eyes: Negative.   Respiratory: Negative.   Cardiovascular: Negative.   Gastrointestinal: Negative.   Endocrine: Negative.   Genitourinary: Negative.   Musculoskeletal: Negative.   Skin: Negative.   Allergic/Immunologic: Negative.   Neurological: Positive for weakness. Negative for dizziness and numbness.  Hematological: Negative.   Psychiatric/Behavioral: Negative.   All other systems reviewed and are negative.     Objective:   Physical Exam Constitutional: No distress . Vital signs reviewed. HENT: Normocephalic.  Atraumatic. Eyes: EOMI. No discharge. Cardiovascular: Irregularly irregular. No JVD. Respiratory: CTA bilaterally. Normal  effort. GI: BS +. Non-distended. Musc: No edema or tenderness in extremities. Neurological: He is alert.  HOH Motor: Right upper extremity 5/5 proximal distal Right lower extremity: 5/5 proximal distal Left upper extremity: 4+/5 shoulder abduction, elbow flexion/extension, 3+/5 hand grip, except for thumb Left lower extremity: 4+/5 proximal to distal Skin: Skin is warm and dry.  Psychiatric: Normal mood.  Normal behavior.     Assessment & Plan:  83 year old male with history of COPD, diet-controlled DM, prior CVA, A. fib on Coumadin presents for follow up for cortical/subcortical infarct in posterior right frontal lobe and remote left parieto-occipital infarct.  1. Left-sided weakness secondary to small acute cortical/subcortical infarct and posterior right frontal lobe and remote left parieto-occipital infarct.  Continues to have weakness and fatigue             Cont HEP  Follow up with Neurology - needs appointment  Cont resting hand splint   Discussed driving restrictions until cleared by physician

## 2018-03-03 DIAGNOSIS — I4891 Unspecified atrial fibrillation: Secondary | ICD-10-CM | POA: Diagnosis not present

## 2018-03-03 DIAGNOSIS — I509 Heart failure, unspecified: Secondary | ICD-10-CM | POA: Diagnosis not present

## 2018-03-03 DIAGNOSIS — R609 Edema, unspecified: Secondary | ICD-10-CM | POA: Diagnosis not present

## 2018-03-03 DIAGNOSIS — R0602 Shortness of breath: Secondary | ICD-10-CM | POA: Diagnosis not present

## 2018-03-04 ENCOUNTER — Ambulatory Visit: Payer: Self-pay | Admitting: Neurology

## 2018-03-04 DIAGNOSIS — R0602 Shortness of breath: Secondary | ICD-10-CM | POA: Diagnosis not present

## 2018-03-05 ENCOUNTER — Ambulatory Visit: Payer: Medicare HMO | Admitting: Adult Health

## 2018-03-05 ENCOUNTER — Encounter: Payer: Self-pay | Admitting: Adult Health

## 2018-03-05 VITALS — BP 137/90 | HR 81 | Ht 68.5 in | Wt 234.8 lb

## 2018-03-05 DIAGNOSIS — I10 Essential (primary) hypertension: Secondary | ICD-10-CM | POA: Diagnosis not present

## 2018-03-05 DIAGNOSIS — E785 Hyperlipidemia, unspecified: Secondary | ICD-10-CM | POA: Diagnosis not present

## 2018-03-05 DIAGNOSIS — I4821 Permanent atrial fibrillation: Secondary | ICD-10-CM | POA: Diagnosis not present

## 2018-03-05 DIAGNOSIS — I639 Cerebral infarction, unspecified: Secondary | ICD-10-CM

## 2018-03-05 NOTE — Progress Notes (Signed)
[facility] [address] [place]. [place] [address] [phone]       OFFICE FOLLOW UP NOTE  [name] Date of Birth:  Jul 31, 1934 Medical Record Number:  [id number]   Reason for Referral:  hospital stroke follow up  CHIEF COMPLAINT:  Chief Complaint  Patient presents with  . Follow-up    Follow up for Stroke hospital visit pt seen by Dr Roda Shutters pt with daughter Dereck Leep his wife    HPI: Chad Franklin is being seen today for initial visit in the office for likely right MCA infarct due to A. fib with subtherapeutic INR on 11/14/2017. History obtained from patient, wife, daughter and chart review. Reviewed all radiology images and labs personally.  [name] is a 83 y.o. male with history of diabetes mellitus, COPD, afib on coumadin and previous stroke in 2012/2013  who presented with left-sided weakness.   CT had reviewed and showed multiple old infarcts but was negative for acute infarct or hemorrhage.  He received IV TPA due to continued deficits of left hemiparesis.  MRI unable to be obtained as patient was unable to lie flat.  As MRI unable to be obtained, it was felt as though he likely had right MCA infarct due to atrial fibrillation with subtherapeutic INR.  2D echo showed an EF of 35 to 40% with akinesis apical and anterior septal myocardium.  CTA head and neck was negative for significant stenosis.  LDL 66 and A1c 5.8.  As patient was previously on Coumadin for atrial fibrillation, this was recommended to change to Eliquis and was initiated 24 hours post TPA administration.  Recommended continuation of pravastatin 10 mg daily for HLD management.  Patient was transferred to [facility] for continued therapies in stable condition.  Patient is being seen today for hospital follow-up and is accompanied by his wife and daughter. He continues to have left sided hand weakness but does feel as though this has been improving.  He denies any additional weakness.  He  has since completed home therapy and continues to do exercises at home. He continues on eliquis without bleeding or bruising. Continues on pravastatin without side effects of myalgias. Blood pressure today 137/90. He does not monitor at home but this is his typical range when he goes to appointments. He continues to do all ADLs independently. He was having frequent headaches since his stroke but has not had any headaches in the past month. Daughter is concerned regarding his lack of energy which has been present since his stroke with some improvement. She states some days are better than others. Even short distance ambulation can fatigue him. He was previously diagnosed with sleep apnea with CPAP recommendation but patient never used machine per family. Wife believes this was about 20 years ago. He declines wanting to be seen again for sleep study at this time.  No further concerns at this time.  Denies new or worsening stroke/TIA symptoms.    ROS:   14 system review of systems performed and negative with exception of headache, weakness and aching muscles  PMH:  Past Medical History:  Diagnosis Date  . Bilateral swelling of feet   . COPD (chronic obstructive pulmonary disease) (HCC)   . Depression   . Diabetes mellitus   . GERD (gastroesophageal reflux disease)   . Palpitations   . Stroke [facility])    2    PSH:  Past Surgical History:  Procedure Laterality Date  . GALLBLADDER SURGERY    .  HERNIA REPAIR      Social History:  Social History   Socioeconomic History  . Marital status: Married    Spouse name: Not on file  . Number of children: Not on file  . Years of education: Not on file  . Highest education level: Not on file  Occupational History  . Not on file  Social Needs  . Financial resource strain: Not on file  . Food insecurity:    Worry: Not on file    Inability: Not on file  . Transportation needs:    Medical: Not on file    Non-medical: Not on file  Tobacco Use  .  Smoking status: Former Games developer  . Smokeless tobacco: Never Used  Substance and Sexual Activity  . Alcohol use: No    Comment: past drinker  . Drug use: Not Currently  . Sexual activity: Not on file  Lifestyle  . Physical activity:    Days per week: Not on file    Minutes per session: Not on file  . Stress: Not on file  Relationships  . Social connections:    Talks on phone: Not on file    Gets together: Not on file    Attends religious service: Not on file    Active member of club or organization: Not on file    Attends meetings of clubs or organizations: Not on file    Relationship status: Not on file  . Intimate partner violence:    Fear of current or ex partner: Not on file    Emotionally abused: Not on file    Physically abused: Not on file    Forced sexual activity: Not on file  Other Topics Concern  . Not on file  Social History Narrative  . Not on file    Family History:  Family History  Problem Relation Age of Onset  . Hypertension Father   . Stroke Sister     Medications:   Current Outpatient Medications on File Prior to Visit  Medication Sig Dispense Refill  . acetaminophen (TYLENOL) 500 MG tablet Take 1 tablet (500 mg total) by mouth every 8 (eight) hours as needed. 30 tablet 0  . apixaban (ELIQUIS) 5 MG TABS tablet Take 1 tablet (5 mg total) by mouth 2 (two) times daily. 60 tablet 0  . carvedilol (COREG) 6.25 MG tablet Take 3 tablets (18.75 mg total) by mouth 2 (two) times daily with a meal. 180 tablet 0  . furosemide (LASIX) 20 MG tablet Take 2 tablets (40 mg total) by mouth daily. (Patient taking differently: Take 160 mg by mouth daily. ) 30 tablet 0  . losartan (COZAAR) 25 MG tablet Take 1 tablet (25 mg total) by mouth daily. 30 tablet 0  . Melatonin 3 MG TABS Take 1 tablet (3 mg total) by mouth at bedtime. 30 tablet 0  . Multiple Vitamin (MULTIVITAMIN WITH MINERALS) TABS tablet Take 1 tablet by mouth daily.    Marland Kitchen PARoxetine (PAXIL) 20 MG tablet Take 1  tablet (20 mg total) by mouth daily. 30 tablet 0  . potassium chloride SA (K-DUR,KLOR-CON) 20 MEQ tablet Take 1 tablet (20 mEq total) by mouth daily. 30 tablet 0  . pravastatin (PRAVACHOL) 10 MG tablet Take 1 tablet (10 mg total) by mouth daily. 30 tablet 0  . senna-docusate (SENOKOT-S) 8.6-50 MG tablet Take 1 tablet by mouth at bedtime as needed for mild constipation.     No current facility-administered medications on file prior to visit.  Allergies:   Allergies  Allergen Reactions  . Naproxen Sodium Anaphylaxis, Hives, Shortness Of Breath and Swelling     Physical Exam  Vitals:   03/05/18 1112  BP: 137/90  Pulse: 81  Weight: 234 lb 12.8 oz (106.5 kg)  Height: 5' 8.5" (1.74 m)   Body mass index is 35.18 kg/m. No exam data present  General: well developed, well nourished, pleasant elderly Caucasian male, seated, in no evident distress Head: head normocephalic and atraumatic.   Neck: supple with no carotid or supraclavicular bruits Cardiovascular: irregular rate and rhythm, no murmurs Musculoskeletal: no deformity Skin:  no rash/petichiae Vascular:  Normal pulses all extremities  Neurologic Exam Mental Status: Awake and fully alert. Oriented to place and time. Recent and remote memory intact. Attention span, concentration and fund of knowledge appropriate. Mood and affect appropriate.  Cranial Nerves: Fundoscopic exam reveals sharp disc margins. Pupils equal, briskly reactive to light. Extraocular movements full without nystagmus. Visual fields full to confrontation. Hearing intact. Facial sensation intact. Face, tongue, palate moves normally and symmetrically.  Motor: Normal bulk and tone. Normal strength in all tested extremity muscles except for left upper extremity fine motor weakness Sensory.: intact to touch , pinprick , position and vibratory sensation.  Coordination: Finger-to-nose and heel-to-shin performed accurately bilaterally with mild difficulty in left upper  extremity.  Orbits right arm over left arm.  Decreased left hand dexterity Gait and Station: Arises from chair without difficulty. Stance is normal. Gait demonstrates normal stride length and balance. Able to heel, toe and tandem walk with moderate difficulty.  Reflexes: 1+ and symmetric. Toes downgoing.    NIHSS  0 Modified Rankin  2 CHA2DS2-VASc 7 HAS-BLED 2   Diagnostic Data (Labs, Imaging, Testing)  Ct Angio Head W Or Wo Contrast Ct Angio Neck W Or Wo Contrast 11/14/2017   ADDENDUM:  1 cm sclerotic lesion T1 most compatible with bone island though, if there is a history of cancer, consider bone scan.  11/14/2017  IMPRESSION:   CTA NECK:  1. No hemodynamically significant stenosis ICA's.  2. Patent vertebral arteries without dissection.  3. Severe LEFT C3-4 neural foraminal narrowing.   CTA HEAD:  1. No emergent large vessel occlusion or flow-limiting stenosis.  2. Mild intracranial atherosclerosis. Aortic Atherosclerosis (ICD10-I70.0). Emphysema (ICD10-J43.9).   Ct Head Code Stroke Wo Contrast 11/14/2017  IMPRESSION:  1. Age indeterminate basal ganglia lacunar infarcts superimposed on old lacunar infarcts.  2. Old small LEFT parietoccipital lobe infarct.  3. Moderate chronic small vessel ischemic changes.  4. ASPECTS is 9.   MRI Small acute cortical and subcortical infarct in the posterior right frontal lobe. Transthoracic Echocardiogram  11/15/2017 Left ventricle: The cavity size was mildly dilated. There was mild focal basal hypertrophy of the septum. Systolic function was moderately reduced. The estimated ejection fraction was in the range of 35% to 40%. - Regional wall motion abnormality: Akinesis of the mid anterior and basal-mid anteroseptal myocardium; hypokinesis of the apical anterior, mid inferoseptal, apical inferior, apical septal, apical lateral, and apical myocardium   Myoview Stress Test 11/17/2017 IMPRESSION: 1. No reversible  ischemia. Fixed defect in the inferolateral wall may represent infarction. Decrease counts in the inferior wall are favored diaphragmatic attenuation.  2. Global hypokinesia. LEFT ventricular dilatation at rest and stress.  3. Left ventricular ejection fraction 28%  4. Non invasive risk stratification*: High (based on low ejection fraction).    ASSESSMENT: ARTHER DWINELL is a 83 y.o. year old male here with likely right MCA infarct  on 11/14/2017 secondary to atrial fibrillation with subtherapeutic INR on Coumadin. Vascular risk factors include DM, HTN, HLD, COPD, CHF, atrial fibrillation and prior infarcts.  Patient is being seen today for hospital follow-up and overall has been recovering well with residual deficit of left upper extremity fine motor control weakness.    PLAN:  1. Likely right MCA infarct: Continue Eliquis (apixaban) daily  and pravastatin for secondary stroke prevention. Maintain strict control of hypertension with blood pressure goal below 130/90, diabetes with hemoglobin A1c goal below 6.5% and cholesterol with LDL cholesterol (bad cholesterol) goal below 70 mg/dL.  I also advised the patient to eat a healthy diet with plenty of whole grains, cereals, fruits and vegetables, exercise regularly with at least 30 minutes of continuous activity daily and maintain ideal body weight. 2. HTN: Advised to continue current treatment regimen.  Today's BP 137/90.  Advised to continue to monitor at home along with continued follow-up with PCP for management 3. HLD: Advised to continue current treatment regimen along with continued follow-up with PCP for future prescribing and monitoring of lipid panel 4. DMII: Advised to continue to monitor glucose levels at home along with continued follow-up with PCP for management and monitoring 5. Atrial fibrillation: Continue to follow with cardiology for atrial fibrillation and Eliquis management.  Wife currently has samples of Eliquis provided  by PCP due to being unable to afford prescription.  We will reach out to cardiology requesting assisting with financial assistance in order to obtain Eliquis which will likely be a long-term medication 6. Provided patient with handout regarding fine motor control exercises due to continued deficit.  Highly advised to do these daily along with continuing to stay active.  Discussion regarding recovery time after stroke.  Highly recommended to undergo sleep study due to prior diagnosis of OSA but patient declines at this time.  He was advised to call office in the future if he would like to undergo testing.  Educated patient on risk factors associated with untreated OSA.    Follow up in 3 months or call earlier if needed   Greater than 50% of time during this 25 minute visit was spent on counseling, explanation of diagnosis of likely right MCA, reviewing risk factor management of atrial fibrillation, HLD, HTN and DM, planning of further management along with potential future management, and discussion with patient and family answering all questions.    George Holden, AGNP-BC  [facility] [address] [place], [place] [address]  Phone [phone] Fax [phone] Note: This document was prepared with digital dictation and possible smart phrase technology. Any transcriptional errors that result from this process are unintentional.

## 2018-03-05 NOTE — Patient Instructions (Signed)
Continue Eliquis (apixaban) daily  and pravastatin  for secondary stroke prevention  Continue to follow up with PCP regarding cholesterol and blood pressure management   Continue to follow with cardiologist for atrial fibrillation and eliquis management - we will reach out to cardiologist to assist you with financial assistance   Continue to do exercises at home including fine motor control exercises for continued hand weakness   Increase activity as tolerated and maintain a healthy diet  Continue to monitor blood pressure at home  Maintain strict control of hypertension with blood pressure goal below 130/90, diabetes with hemoglobin A1c goal below 6.5% and cholesterol with LDL cholesterol (bad cholesterol) goal below 70 mg/dL. I also advised the patient to eat a healthy diet with plenty of whole grains, cereals, fruits and vegetables, exercise regularly and maintain ideal body weight.  Followup in the future with me in 3 months or call earlier if needed       Thank you for coming to see [place] at [facility]. I hope we have been able to provide you high quality care today.  You may receive a patient satisfaction survey over the next few weeks. We would appreciate your feedback and comments so that we may continue to improve ourselves and the health of our patients.

## 2018-03-06 ENCOUNTER — Telehealth: Payer: Self-pay

## 2018-03-06 DIAGNOSIS — I509 Heart failure, unspecified: Secondary | ICD-10-CM | POA: Diagnosis not present

## 2018-03-06 NOTE — Telephone Encounter (Signed)
Rn receive call from [place] pts daughter on dpr about her father taking eliquis. Steward Drone stated her mom and the pt take the medication and the co payment is 75.00 for her father. HEr parents on a fix income and they have other medications that cost. Steward Drone stated she helps her parents out too if they cant afford certain meds. She knows how important the medication because she does not want her father to have another stroke. Rn stated Shanda Bumps NP wants them to see the cardiologist for samples or patient assistance for the eliquis. Steward Drone stated they have an appt in January 2020 with the cardiologist. Rn advised Steward Drone to go online and look at eliquis patient assistance and call the toll free number and see what the qualifications are.Rn if there is a form involve she can print it out and have thecardiologist evaluate it. Rn stated if they decide to do patient assistance there is a process and documentation that has to be turn in. Steward Drone daughter stated she will look into and discuss it with the cardiologist at next appt in January 2020.

## 2018-03-06 NOTE — Telephone Encounter (Signed)
RN was notified by Shanda Bumps NP to call pt to make sure they follow up with cardiology. PT was getting samples from PCP, but may need assistance for 2020. Rn tried calling pts home number, vm could not be left. RN left vm for daughter Steward Drone to remind pt to follow up with cardiology and ask about samples and patient assistance of elquiis for year 2020.

## 2018-03-06 NOTE — Progress Notes (Signed)
RN tried to call patients home number.Could not leave vm. Rn left vm for Steward Drone pts daughter to follow up with cardiology for patient assistance,and samples of eliquis for next year.

## 2018-03-07 ENCOUNTER — Other Ambulatory Visit (HOSPITAL_COMMUNITY): Payer: Medicare HMO

## 2018-03-20 DIAGNOSIS — Z79899 Other long term (current) drug therapy: Secondary | ICD-10-CM | POA: Diagnosis not present

## 2018-03-20 DIAGNOSIS — I509 Heart failure, unspecified: Secondary | ICD-10-CM | POA: Diagnosis not present

## 2018-03-20 DIAGNOSIS — I69359 Hemiplegia and hemiparesis following cerebral infarction affecting unspecified side: Secondary | ICD-10-CM | POA: Diagnosis not present

## 2018-03-20 DIAGNOSIS — E118 Type 2 diabetes mellitus with unspecified complications: Secondary | ICD-10-CM | POA: Diagnosis not present

## 2018-03-20 DIAGNOSIS — I10 Essential (primary) hypertension: Secondary | ICD-10-CM | POA: Diagnosis not present

## 2018-03-20 DIAGNOSIS — I4891 Unspecified atrial fibrillation: Secondary | ICD-10-CM | POA: Diagnosis not present

## 2018-03-20 DIAGNOSIS — Z6833 Body mass index (BMI) 33.0-33.9, adult: Secondary | ICD-10-CM | POA: Diagnosis not present

## 2018-03-23 NOTE — Progress Notes (Signed)
I agree with the above plan 

## 2018-03-25 ENCOUNTER — Other Ambulatory Visit: Payer: Self-pay

## 2018-03-25 ENCOUNTER — Encounter: Payer: Self-pay | Admitting: Cardiology

## 2018-03-25 ENCOUNTER — Ambulatory Visit (HOSPITAL_COMMUNITY): Payer: Medicare HMO | Attending: Cardiology

## 2018-03-25 ENCOUNTER — Ambulatory Visit: Payer: Medicare HMO | Admitting: Cardiology

## 2018-03-25 VITALS — BP 132/72 | HR 99 | Ht 68.0 in | Wt 226.0 lb

## 2018-03-25 DIAGNOSIS — E876 Hypokalemia: Secondary | ICD-10-CM

## 2018-03-25 DIAGNOSIS — I4821 Permanent atrial fibrillation: Secondary | ICD-10-CM

## 2018-03-25 DIAGNOSIS — I5022 Chronic systolic (congestive) heart failure: Secondary | ICD-10-CM | POA: Diagnosis not present

## 2018-03-25 DIAGNOSIS — I10 Essential (primary) hypertension: Secondary | ICD-10-CM | POA: Diagnosis not present

## 2018-03-25 DIAGNOSIS — Z7189 Other specified counseling: Secondary | ICD-10-CM

## 2018-03-25 DIAGNOSIS — Z8673 Personal history of transient ischemic attack (TIA), and cerebral infarction without residual deficits: Secondary | ICD-10-CM

## 2018-03-25 MED ORDER — APIXABAN 5 MG PO TABS: 5.0000 mg | ORAL_TABLET | Freq: Two times a day (BID) | ORAL | 11 refills | Status: DC

## 2018-03-25 MED ORDER — PRAVASTATIN SODIUM 10 MG PO TABS: 10.0000 mg | ORAL_TABLET | Freq: Every day | ORAL | 3 refills | Status: DC

## 2018-03-25 MED ORDER — POTASSIUM CHLORIDE CRYS ER 20 MEQ PO TBCR: 20.0000 meq | EXTENDED_RELEASE_TABLET | Freq: Every day | ORAL | 3 refills | Status: DC

## 2018-03-25 MED ORDER — CARVEDILOL 25 MG PO TABS: 25.0000 mg | ORAL_TABLET | Freq: Two times a day (BID) | ORAL | 3 refills | Status: DC

## 2018-03-25 MED ORDER — LOSARTAN POTASSIUM 25 MG PO TABS: 25.0000 mg | ORAL_TABLET | Freq: Every day | ORAL | 3 refills | Status: DC

## 2018-03-25 MED ORDER — FUROSEMIDE 80 MG PO TABS: 80.0000 mg | ORAL_TABLET | Freq: Every day | ORAL | 3 refills | Status: DC

## 2018-03-25 NOTE — Progress Notes (Signed)
Cardiology Office Note:    Date:  03/25/2018   ID:  Chad BraceHugh B Umland, DOB 05/29/1934, MRN [id number]  PCP:  Lonie Peakonroy, Nathan, PA-C  Cardiologist:  Jodelle RedBridgette Lauralie Blacksher, MD PhD  Referring MD: Lonie Peakonroy, Nathan, PA-C   CC: Heart failure follow up  History of Present Illness:    Chad Franklin is a 84 y.o. male with a hx of recent CVA (embolic, INR not therapeutic on coumadin despite compliance), type II diabetes, COPD, atrial fibrillation now on apixaban who is followed for atrial fibrillation and systolic heart failure. I saw him initially on 12/19/17.  Cardiac history: admitted with CVA 10/2017, found to have new diagnosis of systolic heart failure during that admission. He was found to be in afib RVR at the time. He was placed on carvedilol and losartan, as well as changed from coumadin to apixaban given the stroke occurred on coumadin.  Follow up today: doing well, no complaints. Tolerating all medications, needs 90 day fills on them. No chest pain. Breathing is stable now, though he was sleeping in a chair for a short time in the last few weeks. His breathing got better with increased dose of lasix (took 160 mg daily for a week). Refuses CPAP. Back in bed now, sleeping on 2 pillows. Weighing himself daily, weights stable, normal around 222. No issues with major bleeding, occasional hemorrhoidal bleed. Tolerating apixaban. Reviewed results of echo today with patient, his wife, and his daughter. EF not improved after 3 mos of medications.  Past Medical History:  Diagnosis Date  . Bilateral swelling of feet   . COPD (chronic obstructive pulmonary disease) (HCC)   . Depression   . Diabetes mellitus   . GERD (gastroesophageal reflux disease)   . Palpitations   . Stroke [facility](HCC)    2    Past Surgical History:  Procedure Laterality Date  . GALLBLADDER SURGERY    . HERNIA REPAIR      Current Medications: Current Outpatient Medications on File Prior to Visit  Medication Sig  . acetaminophen  (TYLENOL) 500 MG tablet Take 1 tablet (500 mg total) by mouth every 8 (eight) hours as needed.  . Melatonin 3 MG TABS Take 1 tablet (3 mg total) by mouth at bedtime.  . Multiple Vitamin (MULTIVITAMIN WITH MINERALS) TABS tablet Take 1 tablet by mouth daily.  Marland Kitchen. PARoxetine (PAXIL) 20 MG tablet Take 1 tablet (20 mg total) by mouth daily.  Marland Kitchen. senna-docusate (SENOKOT-S) 8.6-50 MG tablet Take 1 tablet by mouth at bedtime as needed for mild constipation.   No current facility-administered medications on file prior to visit.      Allergies:   Naproxen sodium   Social History   Socioeconomic History  . Marital status: Married    Spouse name: Not on file  . Number of children: Not on file  . Years of education: Not on file  . Highest education level: Not on file  Occupational History  . Not on file  Social Needs  . Financial resource strain: Not on file  . Food insecurity:    Worry: Not on file    Inability: Not on file  . Transportation needs:    Medical: Not on file    Non-medical: Not on file  Tobacco Use  . Smoking status: Former Games developermoker  . Smokeless tobacco: Never Used  Substance and Sexual Activity  . Alcohol use: No    Comment: past drinker  . Drug use: Not Currently  . Sexual activity: Not on file  Lifestyle  .  Physical activity:    Days per week: Not on file    Minutes per session: Not on file  . Stress: Not on file  Relationships  . Social connections:    Talks on phone: Not on file    Gets together: Not on file    Attends religious service: Not on file    Active member of club or organization: Not on file    Attends meetings of clubs or organizations: Not on file    Relationship status: Not on file  Other Topics Concern  . Not on file  Social History Narrative  . Not on file     Family History: The patient's family history includes Hypertension in his father; Stroke in his sister.  ROS:   Please see the history of present illness.  Additional pertinent  ROS:  Constitutional: Negative for chills, fever, night sweats, unintentional weight loss  HENT: Negative for ear pain and hearing loss.   Eyes: Negative for loss of vision and eye pain.  Respiratory: Negative for cough, sputum, shortness of breath, wheezing.   Cardiovascular: Negative for chest pain, palpitations , PND, orthopnea, lower extremity edema and claudication.  Gastrointestinal: Negative for abdominal pain, melena, and hematochezia.  Genitourinary: Negative for dysuria and hematuria.  Musculoskeletal: Negative for falls and myalgias.  Skin: Negative for itching and rash.  Neurological: Negative for focal weakness, focal sensory changes and loss of consciousness.  Endo/Heme/Allergies: Does not bruise/bleed easily.    EKGs/Labs/Other Studies Reviewed:    The following studies were reviewed today: Echo 03/25/18 - Left ventricle: The cavity size was normal. Wall thickness was   increased in a pattern of mild LVH. Systolic function was   severely reduced. The estimated ejection fraction was in the   range of 25% to 30%. Diffuse hypokinesis. The study is not   technically sufficient to allow evaluation of LV diastolic   function. - Aortic valve: There was trivial regurgitation. - Mitral valve: There was moderate regurgitation. - Right ventricle: Systolic function was mildly reduced. - Tricuspid valve: There was mild regurgitation. - Pulmonary arteries: Systolic pressure was mildly increased. PA   peak pressure: 38 mm Hg (S).  Impressions:  - Compared to the prior study, there has been no significant   interval change.   Myoview stress: 11/17/17  FINDINGS: Perfusion: Decreased counts within the apical, mid and basilar segment of the inferior wall which are fixed on rest and stress. Decreased counts in the apical segment of the inferolateral wall which are fixed on rest and stress. No evidence reversible ischemia.  Wall Motion: LEFT ventricle is dilated at rest and  stress. Global hypokinesia.  Left Ventricular Ejection Fraction: 28 % End diastolic volume 183 ml End systolic volume 131 ml  IMPRESSION: 1. No reversible ischemia. Fixed defect in the inferolateral wall may represent infarction. Decrease counts in the inferior wall are favored diaphragmatic attenuation.  2. Global hypokinesia. LEFT ventricular dilatation at rest and stress.  3. Left ventricular ejection fraction 28%  4. Non invasive risk stratification*: High (based on low ejection Fraction).  Echo 11/15/17 Study Conclusions  - Left ventricle: The cavity size was mildly dilated. There was mild focal basal hypertrophy of the septum. Systolic function was moderately reduced. The estimated ejection fraction was in the range of 35% to 40%. - Regional wall motion abnormality: Akinesis of the mid anterior and basal-mid anteroseptal myocardium; hypokinesis of the apical anterior, mid inferoseptal, apical inferior, apical septal, apical lateral, and apical myocardium. - Aortic valve: There  was trivial regurgitation. - Mitral valve: Calcified annulus. Mildly thickened leaflets . There was moderate regurgitation. - Left atrium: The atrium was massively dilated. - Right ventricle: The cavity size was moderately dilated. Wall thickness was normal. Systolic function was mildly reduced. - Right atrium: The atrium was moderately dilated. - Atrial septum: No defect or patent foramen ovale was identified. - Tricuspid valve: There was mild regurgitation. - Pulmonic valve: There was mild regurgitation. - Pulmonary arteries: Systolic pressure was moderately increased. PA peak pressure: 44 mm Hg (S).  Impressions:  - Reduced LVEF with wall motion abnormalities across the anteroseptal, anterior, inferoseptal, and apical myocardium. Consider multivessel CAD vs. takotsubo. Massively dilated LA with moderate MR. Elevated PASP of 44 mmHg.  EKG:  EKG is  ordered today.  The ekg ordered today demonstrates atrial fibrillation, rate 99 bpm.  Recent Labs: 11/21/2017: ALT 29 11/28/2017: BUN 21; Creatinine, Ser 0.80; Hemoglobin 12.8; Platelets 312; Potassium 4.6; Sodium 138  Recent Lipid Panel    Component Value Date/Time   CHOL 116 11/15/2017 0342   TRIG 54 11/15/2017 0342   HDL 39 (L) 11/15/2017 0342   CHOLHDL 3.0 11/15/2017 0342   VLDL 11 11/15/2017 0342   LDLCALC 66 11/15/2017 0342    Physical Exam:    VS:  BP 132/72   Pulse 99   Ht 5\' 8"  (1.727 m)   Wt 226 lb (102.5 kg)   BMI 34.36 kg/m     Wt Readings from Last 3 Encounters:  03/25/18 226 lb (102.5 kg)  03/05/18 234 lb 12.8 oz (106.5 kg)  02/21/18 236 lb 3.2 oz (107.1 kg)     GEN: Well nourished, well developed in no acute distress HEENT: Normal NECK: No JVD; No carotid bruits LYMPHATICS: No lymphadenopathy CARDIAC:irregularly irregular rhythm, normal S1 and S2, no murmurs, rubs, gallops. Radial and DP pulses 2+ bilaterally. RESPIRATORY:  Clear to auscultation without rales, wheezing or rhonchi  ABDOMEN: Soft, non-tender, non-distended MUSCULOSKELETAL:  No edema; No deformity  SKIN: Warm and dry NEUROLOGIC:  Alert and oriented x 3 PSYCHIATRIC:  Normal affect   ASSESSMENT:    1. Chronic systolic heart failure (HCC)   2. Hypokalemia   3. Permanent atrial fibrillation   4. Essential hypertension   5. History of cardioembolic cerebrovascular accident (CVA)   6. Encounter for education about heart failure    PLAN:    Chronic systolic heart failure: diagnosed 10/2017. Euvolemic today.  -Has history of hypokalemia, on potassium supplementation -continue lasix 80 mg daily (was increased previously after fluid retention) -has seen his PCP three times in the last week, reports blood work done at that time. Was told kidneys are normal. Declines repeat blood work today -has been uptitrated on carvedilol and tolerating well. Currently 18.75 mg BID. Will adjust to 25 mg BID  given his heart rate and blood pressure, as well as ease of medication administration. -continue losartan 25 mg daily -no improvement in EF after 3 mos of therapy. No ischemia on stress test. Briefly mentioned ICD, pros/cons, indications.Family will consider.  Heart failure education: Do the following things EVERY DAY:  1) Weigh yourself EVERY morning after you go to the bathroom but before you eat or drink anything. Write this number down in a weight log/diary. If you gain 3 pounds overnight or 5 pounds in a week, call the office.  2) Take your medicines as prescribed. If you have concerns about your medications, please call us before you stop taking them.   3) Eat low salt  foods-Limit salt (sodium) to 2000 mg per day. This will help prevent your body from holding onto fluid. Read food labels as many processed foods have a lot of sodium, especially canned goods and prepackaged meats. If you would like some assistance choosing low sodium foods, we would be happy to set you up with a nutritionist.  4) Stay as active as you can everyday. Staying active will give you more energy and make your muscles stronger. Start with 5 minutes at a time and work your way up to 30 minutes a day. Break up your activities--do some in the morning and some in the afternoon. Start with 3 days per week and work your way up to 5 days as you can.  If you have chest pain, feel short of breath, dizzy, or lightheaded, STOP. If you don't feel better after a short rest, call 911. If you do feel better, call the office to let us know you have symptoms with exercise.  5) Limit all fluids for the day to less than 2 liters. Fluid includes all drinks, coffee, juice, ice chips, soup, jello, and all other liquids.   Permanent atrial fibrillation, with CVA on coumadin (INR subtherapeutic despite compliance), now on apixaban CHA2DS2/VAS Stroke Risk Points = 7  -with high CV score and history of stroke, continue apixaban, and he should  be bridged if anticoagulation needed to be held for any procedure -rate control with carvedilol -unlikely to remain in sinus rhythm with cardioversion given the size of his atrium  Hyperlipidemia: continue pravastatin, at goal of LDL <70.    Plan for follow up: 3 mos  TIME SPENT WITH PATIENT: 25 minutes of direct patient care. More than 50% of that time was spent on coordination of care and counseling regarding heart failure.  Jodelle Red, MD, PhD [place]  [facility]   Medication Adjustments/Labs and Tests Ordered: Current medicines are reviewed at length with the patient today.  Concerns regarding medicines are outlined above.  Orders Placed This Encounter  Procedures  . EKG 12-Lead   Meds ordered this encounter  Medications  . furosemide (LASIX) 80 MG tablet    Sig: Take 1 tablet (80 mg total) by mouth daily.    Dispense:  90 tablet    Refill:  3  . carvedilol (COREG) 25 MG tablet    Sig: Take 1 tablet (25 mg total) by mouth 2 (two) times daily with a meal.    Dispense:  180 tablet    Refill:  3  . apixaban (ELIQUIS) 5 MG TABS tablet    Sig: Take 1 tablet (5 mg total) by mouth 2 (two) times daily.    Dispense:  60 tablet    Refill:  11  . losartan (COZAAR) 25 MG tablet    Sig: Take 1 tablet (25 mg total) by mouth daily.    Dispense:  90 tablet    Refill:  3  . potassium chloride SA (K-DUR,KLOR-CON) 20 MEQ tablet    Sig: Take 1 tablet (20 mEq total) by mouth daily.    Dispense:  90 tablet    Refill:  3  . pravastatin (PRAVACHOL) 10 MG tablet    Sig: Take 1 tablet (10 mg total) by mouth daily.    Dispense:  90 tablet    Refill:  3    Patient Instructions  Medication Instructions:  Take: Coreg 25 mg two times a day  If you need a refill on your cardiac medications before your next appointment,  please call your pharmacy.   Lab work: None  Testing/Procedures: None  Follow-Up: [facility], you and your health needs are our priority.   As part of our continuing mission to provide you with exceptional heart care, we have created designated Provider Care Teams.  These Care Teams include your primary Cardiologist (physician) and Advanced Practice Providers (APPs -  Physician Assistants and Nurse Practitioners) who all work together to provide you with the care you need, when you need it. You will need a follow up appointment in 3 months.  Please call our office 2 months in advance to schedule this appointment.  You may see Jodelle RedBridgette Jibreel Fedewa, MD or one of the following Advanced Practice Providers on your designated Care Team:   Theodore DemarkRhonda Barrett, PA-C . Joni ReiningKathryn Lawrence, DNP, ANP       Signed, Jodelle RedBridgette Elesa Garman, MD PhD 03/25/2018 3:44 PM    [facility]

## 2018-03-25 NOTE — Patient Instructions (Addendum)
Medication Instructions:  Take: Coreg 25 mg two times a day  If you need a refill on your cardiac medications before your next appointment, please call your pharmacy.   Lab work: None  Testing/Procedures: None  Follow-Up: At [facility], you and your health needs are our priority.  As part of our continuing mission to provide you with exceptional heart care, we have created designated Provider Care Teams.  These Care Teams include your primary Cardiologist (physician) and Advanced Practice Providers (APPs -  Physician Assistants and Nurse Practitioners) who all work together to provide you with the care you need, when you need it. You will need a follow up appointment in 3 months.  Please call our office 2 months in advance to schedule this appointment.  You may see Jodelle Red, MD or one of the following Advanced Practice Providers on your designated Care Team:   Theodore Demark, PA-C . Joni Reining, DNP, ANP

## 2018-04-01 ENCOUNTER — Other Ambulatory Visit (HOSPITAL_COMMUNITY): Payer: Self-pay | Admitting: Physical Medicine and Rehabilitation

## 2018-05-23 ENCOUNTER — Encounter: Payer: Self-pay | Admitting: Physical Medicine & Rehabilitation

## 2018-05-23 ENCOUNTER — Encounter: Payer: Medicare HMO | Attending: Physical Medicine & Rehabilitation | Admitting: Physical Medicine & Rehabilitation

## 2018-05-23 VITALS — BP 108/73 | HR 80 | Ht 68.0 in | Wt 222.0 lb

## 2018-05-23 DIAGNOSIS — Z8249 Family history of ischemic heart disease and other diseases of the circulatory system: Secondary | ICD-10-CM | POA: Insufficient documentation

## 2018-05-23 DIAGNOSIS — F329 Major depressive disorder, single episode, unspecified: Secondary | ICD-10-CM | POA: Insufficient documentation

## 2018-05-23 DIAGNOSIS — I4891 Unspecified atrial fibrillation: Secondary | ICD-10-CM | POA: Insufficient documentation

## 2018-05-23 DIAGNOSIS — R531 Weakness: Secondary | ICD-10-CM | POA: Diagnosis not present

## 2018-05-23 DIAGNOSIS — Z87891 Personal history of nicotine dependence: Secondary | ICD-10-CM | POA: Diagnosis not present

## 2018-05-23 DIAGNOSIS — R5383 Other fatigue: Secondary | ICD-10-CM

## 2018-05-23 DIAGNOSIS — J449 Chronic obstructive pulmonary disease, unspecified: Secondary | ICD-10-CM | POA: Diagnosis not present

## 2018-05-23 DIAGNOSIS — E119 Type 2 diabetes mellitus without complications: Secondary | ICD-10-CM | POA: Diagnosis not present

## 2018-05-23 DIAGNOSIS — Z8673 Personal history of transient ischemic attack (TIA), and cerebral infarction without residual deficits: Secondary | ICD-10-CM

## 2018-05-23 DIAGNOSIS — Z7901 Long term (current) use of anticoagulants: Secondary | ICD-10-CM | POA: Diagnosis not present

## 2018-05-23 DIAGNOSIS — I69354 Hemiplegia and hemiparesis following cerebral infarction affecting left non-dominant side: Secondary | ICD-10-CM | POA: Diagnosis not present

## 2018-05-23 DIAGNOSIS — I10 Essential (primary) hypertension: Secondary | ICD-10-CM

## 2018-05-23 DIAGNOSIS — K219 Gastro-esophageal reflux disease without esophagitis: Secondary | ICD-10-CM | POA: Diagnosis not present

## 2018-05-23 MED ORDER — AMANTADINE HCL 100 MG PO CAPS: 100.0000 mg | ORAL_CAPSULE | Freq: Two times a day (BID) | ORAL | 1 refills | Status: DC

## 2018-05-23 NOTE — Progress Notes (Signed)
Subjective:    Patient ID: Chad Franklin, male    DOB: [birth year], 84 y.o.   MRN: [id number]  HPI 84 year old male with history of COPD, diet-controlled DM, prior CVA, A. fib on Coumadin presents for follow up for cortical/subcortical infarct in posterior right frontal lobe and remote left parieto-occipital infarct.  Last clinic visit 123/18/19.  Wife supplements history. Since that time, pt saw Cards and Neuro, notes reviewed. Pt states he continues to have fatigue and weakness.  He doing HEP. He is wearing his WHO. He has been driving, states he was cleared.  He has had 2 falls, once getting up quickly and the second time after getting up and walking in a store.    Pain Inventory Average Pain 5 Pain Right Now 0 My pain is aching  In the last 24 hours, has pain interfered with the following? General activity 3 Relation with others 3 Enjoyment of life 3 What TIME of day is your pain at its worst? morning Sleep (in general) Fair  Pain is worse with: inactivity Pain improves with: medication Relief from Meds: 5  Mobility walk without assistance walk with assistance  Function retired  Neuro/Psych weakness  Prior Studies Any changes since last visit?  no  Physicians involved in your care Primary care Dr. Lonie Peak Dr. Jodelle Red   Family History  Problem Relation Age of Onset  . Hypertension Father   . Stroke Sister    Social History   Socioeconomic History  . Marital status: Married    Spouse name: Not on file  . Number of children: Not on file  . Years of education: Not on file  . Highest education level: Not on file  Occupational History  . Not on file  Social Needs  . Financial resource strain: Not on file  . Food insecurity:    Worry: Not on file    Inability: Not on file  . Transportation needs:    Medical: Not on file    Non-medical: Not on file  Tobacco Use  . Smoking status: Former Games developer  . Smokeless tobacco: Never Used    Substance and Sexual Activity  . Alcohol use: No    Comment: past drinker  . Drug use: Not Currently  . Sexual activity: Not on file  Lifestyle  . Physical activity:    Days per week: Not on file    Minutes per session: Not on file  . Stress: Not on file  Relationships  . Social connections:    Talks on phone: Not on file    Gets together: Not on file    Attends religious service: Not on file    Active member of club or organization: Not on file    Attends meetings of clubs or organizations: Not on file    Relationship status: Not on file  Other Topics Concern  . Not on file  Social History Narrative  . Not on file   Past Surgical History:  Procedure Laterality Date  . GALLBLADDER SURGERY    . HERNIA REPAIR     Past Medical History:  Diagnosis Date  . Bilateral swelling of feet   . COPD (chronic obstructive pulmonary disease) (HCC)   . Depression   . Diabetes mellitus   . GERD (gastroesophageal reflux disease)   . Palpitations   . Stroke (HCC)    2   BP 108/73 (BP Location: Right Arm, Patient Position: Sitting, Cuff Size: Large)   Pulse 80   Ht  5\' 8"  (1.727 m)   Wt 222 lb (100.7 kg)   SpO2 94%   BMI 33.75 kg/m   Opioid Risk Score:   Fall Risk Score:  `1  Depression screen PHQ 2/9  Depression screen [facility] 2/9 02/21/2018 12/20/2017  Decreased Interest 0 1  Down, Depressed, Hopeless 0 0  PHQ - 2 Score 0 1   Review of Systems  Constitutional: Positive for appetite change.  HENT: Negative.   Eyes: Negative.   Respiratory: Negative.   Cardiovascular: Negative.   Gastrointestinal: Negative.   Endocrine: Negative.   Genitourinary: Negative.   Musculoskeletal: Positive for arthralgias.  Skin: Negative.   Allergic/Immunologic: Negative.   Neurological: Positive for dizziness and weakness.  Hematological: Negative.   Psychiatric/Behavioral: Negative.   All other systems reviewed and are negative.     Objective:   Physical Exam Constitutional: No  distress . Vital signs reviewed. HENT: Normocephalic.  Atraumatic. Eyes: EOMI. No discharge. Cardiovascular: Irregularly irregular. No JVD. Respiratory: CTA bilaterally. Normal effort. GI: BS +. Non-distended. Musc: No edema or tenderness in extremities. Neurological: He is alert.  HOH Motor:  Left upper extremity: 4+/5 shoulder abduction, elbow flexion 4/5, 3+/5 elbow extension, 3+/5 hand grip, except for thumb Left lower extremity: 4+/5 proximal to distal Skin: Skin is warm and dry.  Psychiatric: Normal mood.  Normal behavior.     Assessment & Plan:  84 year old male with history of COPD, diet-controlled DM, prior CVA, A. fib on Coumadin presents for follow up for cortical/subcortical infarct in posterior right frontal lobe and remote left parieto-occipital infarct.  1. Left-sided weakness secondary to small acute cortical/subcortical infarct and posterior right frontal lobe and remote left parieto-occipital infarct.  Continues to have weakness and fatigue             Cont HEP  Cont follow up with Neurology  Cont follow up with Cards  Cont resting hand splint   Trial amantadine per pt request   2. Abnormality of gait  Discussed slow postural transitions  Encouraged fluid intake

## 2018-06-03 DIAGNOSIS — R102 Pelvic and perineal pain: Secondary | ICD-10-CM | POA: Diagnosis not present

## 2018-06-03 DIAGNOSIS — J441 Chronic obstructive pulmonary disease with (acute) exacerbation: Secondary | ICD-10-CM | POA: Diagnosis not present

## 2018-06-03 DIAGNOSIS — Z6833 Body mass index (BMI) 33.0-33.9, adult: Secondary | ICD-10-CM | POA: Diagnosis not present

## 2018-06-06 ENCOUNTER — Ambulatory Visit: Payer: Medicare HMO | Admitting: Adult Health

## 2018-06-10 DIAGNOSIS — N400 Enlarged prostate without lower urinary tract symptoms: Secondary | ICD-10-CM | POA: Diagnosis not present

## 2018-06-10 DIAGNOSIS — N3020 Other chronic cystitis without hematuria: Secondary | ICD-10-CM | POA: Diagnosis not present

## 2018-06-10 DIAGNOSIS — N318 Other neuromuscular dysfunction of bladder: Secondary | ICD-10-CM | POA: Diagnosis not present

## 2018-06-10 DIAGNOSIS — N401 Enlarged prostate with lower urinary tract symptoms: Secondary | ICD-10-CM | POA: Diagnosis not present

## 2018-06-10 DIAGNOSIS — R310 Gross hematuria: Secondary | ICD-10-CM | POA: Diagnosis not present

## 2018-06-12 ENCOUNTER — Telehealth: Payer: Self-pay | Admitting: *Deleted

## 2018-06-12 NOTE — Telephone Encounter (Signed)
Chad Franklin [name]'s daughter called with concern about [name] having dizziness since starting the new medication prescribed last visit.(?amantadine?). Please advise.

## 2018-06-12 NOTE — Telephone Encounter (Addendum)
Notified. 

## 2018-06-12 NOTE — Telephone Encounter (Signed)
He should discontinue the medication if it is causing him dizziness. Thanks.

## 2018-06-23 ENCOUNTER — Telehealth: Payer: Self-pay

## 2018-06-23 ENCOUNTER — Ambulatory Visit: Payer: Medicare HMO | Admitting: Physical Medicine & Rehabilitation

## 2018-06-23 DIAGNOSIS — I10 Essential (primary) hypertension: Secondary | ICD-10-CM | POA: Diagnosis not present

## 2018-06-23 DIAGNOSIS — Z6833 Body mass index (BMI) 33.0-33.9, adult: Secondary | ICD-10-CM | POA: Diagnosis not present

## 2018-06-23 DIAGNOSIS — I69359 Hemiplegia and hemiparesis following cerebral infarction affecting unspecified side: Secondary | ICD-10-CM | POA: Diagnosis not present

## 2018-06-23 DIAGNOSIS — I509 Heart failure, unspecified: Secondary | ICD-10-CM | POA: Diagnosis not present

## 2018-06-23 DIAGNOSIS — R319 Hematuria, unspecified: Secondary | ICD-10-CM | POA: Diagnosis not present

## 2018-06-23 DIAGNOSIS — E118 Type 2 diabetes mellitus with unspecified complications: Secondary | ICD-10-CM | POA: Diagnosis not present

## 2018-06-23 DIAGNOSIS — I4891 Unspecified atrial fibrillation: Secondary | ICD-10-CM | POA: Diagnosis not present

## 2018-06-23 NOTE — Telephone Encounter (Signed)
Attempted to contact pt to change appointment to virtual visit. Left message to call back. 

## 2018-06-25 NOTE — Telephone Encounter (Signed)
° °  Patient's daughter called to cancel appt. Scheduler offered tele visit. Daughter states she will talk to her mother and call back

## 2018-06-26 ENCOUNTER — Ambulatory Visit: Payer: Medicare HMO | Admitting: Cardiology

## 2018-06-30 ENCOUNTER — Encounter: Payer: Medicare HMO | Attending: Physical Medicine & Rehabilitation | Admitting: Physical Medicine & Rehabilitation

## 2018-06-30 ENCOUNTER — Other Ambulatory Visit: Payer: Self-pay

## 2018-06-30 ENCOUNTER — Encounter: Payer: Self-pay | Admitting: Physical Medicine & Rehabilitation

## 2018-06-30 VITALS — BP 124/93 | HR 78 | Wt 220.8 lb

## 2018-06-30 DIAGNOSIS — I69354 Hemiplegia and hemiparesis following cerebral infarction affecting left non-dominant side: Secondary | ICD-10-CM

## 2018-06-30 DIAGNOSIS — R531 Weakness: Secondary | ICD-10-CM | POA: Insufficient documentation

## 2018-06-30 DIAGNOSIS — Z8249 Family history of ischemic heart disease and other diseases of the circulatory system: Secondary | ICD-10-CM | POA: Insufficient documentation

## 2018-06-30 DIAGNOSIS — I10 Essential (primary) hypertension: Secondary | ICD-10-CM | POA: Diagnosis not present

## 2018-06-30 DIAGNOSIS — F329 Major depressive disorder, single episode, unspecified: Secondary | ICD-10-CM | POA: Insufficient documentation

## 2018-06-30 DIAGNOSIS — Z8673 Personal history of transient ischemic attack (TIA), and cerebral infarction without residual deficits: Secondary | ICD-10-CM | POA: Insufficient documentation

## 2018-06-30 DIAGNOSIS — I951 Orthostatic hypotension: Secondary | ICD-10-CM

## 2018-06-30 DIAGNOSIS — E119 Type 2 diabetes mellitus without complications: Secondary | ICD-10-CM | POA: Insufficient documentation

## 2018-06-30 DIAGNOSIS — R5383 Other fatigue: Secondary | ICD-10-CM

## 2018-06-30 DIAGNOSIS — Z87891 Personal history of nicotine dependence: Secondary | ICD-10-CM | POA: Insufficient documentation

## 2018-06-30 DIAGNOSIS — R269 Unspecified abnormalities of gait and mobility: Secondary | ICD-10-CM | POA: Diagnosis not present

## 2018-06-30 DIAGNOSIS — I4891 Unspecified atrial fibrillation: Secondary | ICD-10-CM | POA: Insufficient documentation

## 2018-06-30 DIAGNOSIS — K219 Gastro-esophageal reflux disease without esophagitis: Secondary | ICD-10-CM | POA: Insufficient documentation

## 2018-06-30 DIAGNOSIS — Z7901 Long term (current) use of anticoagulants: Secondary | ICD-10-CM | POA: Insufficient documentation

## 2018-06-30 DIAGNOSIS — J449 Chronic obstructive pulmonary disease, unspecified: Secondary | ICD-10-CM | POA: Insufficient documentation

## 2018-06-30 NOTE — Progress Notes (Signed)
Subjective:    Patient ID: Chad Franklin, male    DOB: 09/18/1934, 84 y.o.   MRN: [id number]  TELEHEALTH NOTE  Due to national recommendations of social distancing due to COVID 19, an audio/video telehealth visit is felt to be most appropriate for this patient at this time.  See Chart message from today for the patient's consent to telehealth from [facility].     I verified that I am speaking with the correct person using two identifiers.  Location of patient: Home Location of provider: Home Method of communication: Telephone Names of participants : Wadie Lessen scheduling, Suezanne Jacquet obtaining consent and vitals if available Established patient Time spent on call: 15 minutes  HPI  84 year old male with history of COPD, diet-controlled DM, prior CVA, A. fib on Coumadin presents for follow up for cortical/subcortical infarct in posterior right frontal lobe and remote left parieto-occipital infarct.  Last clinic visit 05/23/2018.  Daughter speaks for patient and helps answer questions.  Since that time, daughter realized that he was doubling his BP and cholestorol meds by accident. He has not fallen, but stumbled a few times. He has not followed up with his Cards. He continues HEP. He saw Neurology. He has not been following through with slow postural transitions. He is drinking limited water.   Pain Inventory Average Pain 0 Pain Right Now 0 My pain is no pain  In the last 24 hours, has pain interfered with the following? General activity 0 Relation with others 0 Enjoyment of life 0 What TIME of day is your pain at its worst? no pain Sleep (in general) Good  Pain is worse with: no pain Pain improves with: no pain Relief from Meds: no pain  Mobility walk without assistance use a walker  Function retired  Neuro/Psych weakness numbness tremor tingling trouble walking dizziness confusion depression  Prior Studies Any changes  since last visit?  yes  Has been having continued dizziness even after stopping amantadine.  Wife feels he needs increase in his paxil.  Physicians involved in your care Primary care Dr. Lonie Peak Dr. Jodelle Red   Family History  Problem Relation Age of Onset  . Hypertension Father   . Stroke Sister    Social History   Socioeconomic History  . Marital status: Married    Spouse name: Not on file  . Number of children: Not on file  . Years of education: Not on file  . Highest education level: Not on file  Occupational History  . Not on file  Social Needs  . Financial resource strain: Not on file  . Food insecurity:    Worry: Not on file    Inability: Not on file  . Transportation needs:    Medical: Not on file    Non-medical: Not on file  Tobacco Use  . Smoking status: Former Games developer  . Smokeless tobacco: Never Used  Substance and Sexual Activity  . Alcohol use: No    Comment: past drinker  . Drug use: Not Currently  . Sexual activity: Not on file  Lifestyle  . Physical activity:    Days per week: Not on file    Minutes per session: Not on file  . Stress: Not on file  Relationships  . Social connections:    Talks on phone: Not on file    Gets together: Not on file    Attends religious service: Not on file    Active member of club or  organization: Not on file    Attends meetings of clubs or organizations: Not on file    Relationship status: Not on file  Other Topics Concern  . Not on file  Social History Narrative  . Not on file   Past Surgical History:  Procedure Laterality Date  . GALLBLADDER SURGERY    . HERNIA REPAIR     Past Medical History:  Diagnosis Date  . Bilateral swelling of feet   . COPD (chronic obstructive pulmonary disease) (HCC)   . Depression   . Diabetes mellitus   . GERD (gastroesophageal reflux disease)   . Palpitations   . Stroke (HCC)    2   BP (!) 124/93 Comment: pt reported from home monitor  Pulse 78  Comment: pt reported from home monitor  Wt 220 lb 12.8 oz (100.2 kg) Comment: pt reported from home scale  BMI 33.57 kg/m   Opioid Risk Score:   Fall Risk Score:  `1  Depression screen PHQ 2/9  Depression screen [facility] 2/9 06/30/2018 02/21/2018 12/20/2017  Decreased Interest 0 0 1  Down, Depressed, Hopeless 0 0 0  PHQ - 2 Score 0 0 1   Review of Systems  Constitutional: Positive for appetite change.  HENT: Negative.   Eyes: Negative.   Respiratory: Negative.   Cardiovascular: Negative.   Gastrointestinal: Negative.   Endocrine: Negative.   Genitourinary: Negative.   Musculoskeletal: Positive for arthralgias and gait problem.  Skin: Negative.   Allergic/Immunologic: Negative.   Neurological: Positive for dizziness and weakness.  Hematological: Bruises/bleeds easily.       Eliquis  Psychiatric/Behavioral: Positive for dysphoric mood.  All other systems reviewed and are negative.     Objective:   Physical Exam Gen: NAD. Pulm: Effort normal Neuro: Alert and oriented    Assessment & Plan:  84 year old male with history of COPD, diet-controlled DM, prior CVA, A. fib on Coumadin presents for follow up for cortical/subcortical infarct in posterior right frontal lobe and remote left parieto-occipital infarct.  1. Left-sided weakness secondary to small acute cortical/subcortical infarct and posterior right frontal lobe and remote left parieto-occipital infarct.  Continues to have weakness and fatigue             Cont HEP  Cont follow up with Neurology  Cont follow up with Cards, encouraged  Cont resting hand splint   Retrial amantadine - dizziness likely secondary to accidentally doubling BP meds and orthostasis  2. Abnormality of gait  Discussed slow postural transitions again  Encouraged water intake specifically  3. Orthostasis  Likley strong contributor to dizziness

## 2018-07-01 ENCOUNTER — Telehealth: Payer: Self-pay | Admitting: *Deleted

## 2018-07-01 NOTE — Telephone Encounter (Signed)
How much bleeding is he having when urinating? With recent stroke and afib, he is high risk if he is not on anticoagulation, but if he is having significant bleeding, this might contribute to his symptoms. With recent stroke, it is also unclear if this is a neurologic issue. He had a telehealth visit note from yesterday that also noted that he had accidentally been doubling some of his medication. He needs at least bloodwork done (CBC and BMET). He should talk to his PCP if he has not already and let them know as well. I would be happy to set up a virtual visit with him next week to talk more about this.

## 2018-07-01 NOTE — Telephone Encounter (Signed)
Lm to call back ./cy 

## 2018-07-01 NOTE — Telephone Encounter (Signed)
Spoke with pt and pt gave permission to speak with wife .Pt has noted weakness for 2 weeks "cant hardly walk" appetite good B/P 134/60 and HR 93 no other symptoms Pt refuses to come to office during virus outbreak.Also while talking to wife found out that pt has been bleeding when urinating since has been on Eliquis. Will forward message to Dr Cristal Deer for review and recommendation./cy

## 2018-07-01 NOTE — Telephone Encounter (Signed)
This may be something that can be handled with a triage call  ----- Message -----  From: Otis Brace  Sent: 06/30/2018  1:57 PM EDT  To: Kathie Rhodes Scheduling  Subject: Appointment Request                 Appointment Request From: Otis Brace    With Provider: Jodelle Red, MD [facility]]    Preferred Date Range: 07/01/2018 - 07/04/2018    Preferred Times: Any time    Reason for visit: Request an Appointment    Comments:  tiredness and weakness, spoke with PT Dr and he suggested an tele appt with cardiologist to review meds in case a decrease is needed

## 2018-07-02 NOTE — Telephone Encounter (Signed)
LMTCB

## 2018-07-02 NOTE — Telephone Encounter (Signed)
Follow Up:; ° ° °Returning your call. °

## 2018-07-03 DIAGNOSIS — R531 Weakness: Secondary | ICD-10-CM | POA: Diagnosis not present

## 2018-07-03 DIAGNOSIS — Z6832 Body mass index (BMI) 32.0-32.9, adult: Secondary | ICD-10-CM | POA: Diagnosis not present

## 2018-07-03 DIAGNOSIS — I10 Essential (primary) hypertension: Secondary | ICD-10-CM | POA: Diagnosis not present

## 2018-07-03 DIAGNOSIS — I69359 Hemiplegia and hemiparesis following cerebral infarction affecting unspecified side: Secondary | ICD-10-CM | POA: Diagnosis not present

## 2018-07-03 DIAGNOSIS — I4891 Unspecified atrial fibrillation: Secondary | ICD-10-CM | POA: Diagnosis not present

## 2018-07-03 DIAGNOSIS — R319 Hematuria, unspecified: Secondary | ICD-10-CM | POA: Diagnosis not present

## 2018-07-03 NOTE — Telephone Encounter (Signed)
Spoke with pt ,pt's wife and daughter Steward Drone) Per wife pt has more blood in urine in am and this morning noted 3 clots "1/2 the size of little finger".Instructed as well to call PMD for eval also and the need to get CBC and BMET done.Per daughter pt  has seen urologists approx 1 month ago and eval was normal findings Also,pt agrees to do virtual visit next week (phone call no internet) with Dr Cristal Deer Per wife pt would be willing to change back to Warfarin if Dr Anna Genre could follow Will forward to Dr Cristal Deer for review .Zack Seal

## 2018-07-04 NOTE — Telephone Encounter (Signed)
Wife updated with recommendation and voiced understanding. 

## 2018-07-04 NOTE — Telephone Encounter (Signed)
Thanks. Warfarin is usually not better than the DOACs for bleeding, and it would involve more frequent blood checks. We will see what the bloodwork shows and what his PCP says, and then we will discuss further.

## 2018-07-14 ENCOUNTER — Telehealth: Payer: Self-pay

## 2018-07-14 NOTE — Telephone Encounter (Signed)
Called patient's daughter Steward Drone.Left message on personal voice mail I was calling about a message I received to schedule your father appointment with Joni Reining DNP.Advised I sent message to her nurse to schedule appointment.

## 2018-07-16 NOTE — Telephone Encounter (Signed)
Here are the current labs on [facility] Cholesterol, total 116.000 11/15/2017 HDL 39.000 11/15/2017 LDL 66.000 11/15/2017 Triglycerides 54.000 11/15/2017 A1C 6.500 % 03/20/2018 Hemoglobin 15.500 g/ 07/03/2018 Creatinine, Serum 1.130 mg/ 07/03/2018 Potassium 4.600 mm 07/03/2018 Magnesium N/D ALT (SGPT) 11.000 IU/ 07/03/2018 TSH N/D BNP 778.900 P 03/04/2018 INR 1.180 11/14/2017 Platelets 194.000 x1 07/03/2018

## 2018-07-16 NOTE — Telephone Encounter (Signed)
Called patient's daughter Steward Drone, [place]. PT HAS VIRTUAL APPT 5-6 W/DR Cristal Deer

## 2018-07-18 ENCOUNTER — Telehealth: Payer: Medicare HMO | Admitting: Physician Assistant

## 2018-07-23 ENCOUNTER — Telehealth (INDEPENDENT_AMBULATORY_CARE_PROVIDER_SITE_OTHER): Payer: Medicare HMO | Admitting: Cardiology

## 2018-07-23 VITALS — BP 136/84 | HR 81 | Ht 68.0 in | Wt 218.0 lb

## 2018-07-23 DIAGNOSIS — Z7189 Other specified counseling: Secondary | ICD-10-CM | POA: Diagnosis not present

## 2018-07-23 DIAGNOSIS — I5022 Chronic systolic (congestive) heart failure: Secondary | ICD-10-CM

## 2018-07-23 DIAGNOSIS — I110 Hypertensive heart disease with heart failure: Secondary | ICD-10-CM | POA: Diagnosis not present

## 2018-07-23 DIAGNOSIS — I482 Chronic atrial fibrillation, unspecified: Secondary | ICD-10-CM | POA: Diagnosis not present

## 2018-07-23 DIAGNOSIS — Z8673 Personal history of transient ischemic attack (TIA), and cerebral infarction without residual deficits: Secondary | ICD-10-CM

## 2018-07-23 DIAGNOSIS — I10 Essential (primary) hypertension: Secondary | ICD-10-CM

## 2018-07-23 NOTE — Patient Instructions (Signed)

## 2018-07-23 NOTE — Progress Notes (Signed)
Virtual Visit via Video Note   This visit type was conducted due to national recommendations for restrictions regarding the COVID-19 Pandemic (e.g. social distancing) in an effort to limit this patient's exposure and mitigate transmission in our community.  Due to his co-morbid illnesses, this patient is at least at moderate risk for complications without adequate follow up.  This format is felt to be most appropriate for this patient at this time.  All issues noted in this document were discussed and addressed.  A limited physical exam was performed with this format.  Please refer to the patient's chart for his consent to tele[facility].   Date:  07/23/2018   ID:  Chad Franklin, DOB 25-Feb-1935, MRN [id number]  Patient Location: Home Provider Location: Home  PCP:  Lonie Peakonroy, Nathan, PA-C  Cardiologist:  Jodelle RedBridgette Anival Pasha, MD  Electrophysiologist:  None   Evaluation Performed:  Follow-Up Visit  Chief Complaint:  followup  History of Present Illness:    Chad Franklin is a 84 y.o. male with recent CVA (embolic, INR not therapeutic on coumadin despite compliance), type II diabetes, COPD, atrial fibrillation now on apixaban who is followed for atrial fibrillation and systolic heart failure. I saw him initially on 12/19/17.  The patient does not have symptoms concerning for COVID-19 infection (fever, chills, cough, or new shortness of breath).   Overall doing well. Thinks his weight is up some but not a lot. Denies chest pain, shortness of breath at rest or with normal exertion. No PND, orthopnea, LE edema. No syncope or palpitations.   Had a prior episode of hematuria, followed by PCP, [facility].   Past Medical History:  Diagnosis Date  . Bilateral swelling of feet   . COPD (chronic obstructive pulmonary disease) (HCC)   . Depression   . Diabetes mellitus   . GERD (gastroesophageal reflux disease)   . Palpitations   . Stroke [facility](HCC)    2   Past  Surgical History:  Procedure Laterality Date  . GALLBLADDER SURGERY    . HERNIA REPAIR       Current Meds  Medication Sig  . acetaminophen (TYLENOL) 500 MG tablet Take 1 tablet (500 mg total) by mouth every 8 (eight) hours as needed.  Marland Kitchen. apixaban (ELIQUIS) 5 MG TABS tablet Take 1 tablet (5 mg total) by mouth 2 (two) times daily.  . carvedilol (COREG) 25 MG tablet Take 1 tablet (25 mg total) by mouth 2 (two) times daily with a meal.  . furosemide (LASIX) 80 MG tablet Take 1 tablet (80 mg total) by mouth daily.  Marland Kitchen. losartan (COZAAR) 25 MG tablet Take 1 tablet (25 mg total) by mouth daily.  Marland Kitchen. PARoxetine (PAXIL) 20 MG tablet Take 1 tablet (20 mg total) by mouth daily.  . potassium chloride SA (K-DUR,KLOR-CON) 20 MEQ tablet Take 1 tablet (20 mEq total) by mouth daily.  . pravastatin (PRAVACHOL) 10 MG tablet Take 1 tablet (10 mg total) by mouth daily.     Allergies:   Naproxen sodium   Social History   Tobacco Use  . Smoking status: Former Games developermoker  . Smokeless tobacco: Never Used  Substance Use Topics  . Alcohol use: No    Comment: past drinker  . Drug use: Not Currently     Family Hx: The patient's family history includes Hypertension in his father; Stroke in his sister.  ROS:   Please see the history of present illness.    Constitutional: Negative for chills,  fever, night sweats, unintentional weight loss  HENT: Negative for ear pain and hearing loss.   Eyes: Negative for loss of vision and eye pain.  Respiratory: Negative for cough, sputum, wheezing.   Cardiovascular: See HPI. Gastrointestinal: Negative for abdominal pain, melena, and hematochezia.  Genitourinary: Negative for dysuria. See HPI re: hematuria Musculoskeletal: Negative for falls and myalgias.  Skin: Negative for itching and rash.  Neurological: Negative for focal weakness, focal sensory changes and loss of consciousness.  Endo/Heme/Allergies: Does bruise/bleed easily.  All other systems reviewed and are  negative.   Prior CV studies:   The following studies were reviewed today: Echo 03/25/18 personally reviewed  Labs/Other Tests and Data Reviewed:    EKG:  An ECG dated 03/25/18 was personally reviewed today and demonstrated:  afib at 99 bpm  Recent Labs: 11/21/2017: ALT 29 11/28/2017: BUN 21; Creatinine, Ser 0.80; Hemoglobin 12.8; Platelets 312; Potassium 4.6; Sodium 138   Recent Lipid Panel Lab Results  Component Value Date/Time   CHOL 116 11/15/2017 03:42 AM   TRIG 54 11/15/2017 03:42 AM   HDL 39 (L) 11/15/2017 03:42 AM   CHOLHDL 3.0 11/15/2017 03:42 AM   LDLCALC 66 11/15/2017 03:42 AM    Wt Readings from Last 3 Encounters:  07/23/18 218 lb (98.9 kg)  06/30/18 220 lb 12.8 oz (100.2 kg)  05/23/18 222 lb (100.7 kg)     Objective:    Vital Signs:  BP 136/84   Pulse 81   Ht 5\' 8"  (1.727 m)   Wt 218 lb (98.9 kg)   BMI 33.15 kg/m    VITAL SIGNS:  reviewed GEN:  no acute distress EYES:  sclerae anicteric, EOMI - Extraocular Movements Intact RESPIRATORY:  normal respiratory effort, symmetric expansion CARDIOVASCULAR:  No JVD appreciated SKIN:  no rash, lesions or ulcers. MUSCULOSKELETAL:  no obvious deformities. NEURO:  alert and oriented x 3, no obvious focal deficit PSYCH:  normal affect  ASSESSMENT & PLAN:    Chronic systolic heart failure: diagnosed 10/2017. Reports doing well, via video appears comfortable and euvolemic. -Has history of hypokalemia, on potassium supplementation -continue lasix 80 mg daily -continue carvedilol 25 mg BID with meals -continue losartan 25 mg daily -no improvement in EF after 3 mos of therapy. No ischemia on stress test. Discussed ICD at prior visit, contemplating.  Permanent atrial fibrillation, with CVA on coumadin (INR subtherapeutic despite compliance), now on apixaban CHA2DS2/VAS Stroke Risk Points = 7  -with high CV score and history of stroke, continue apixaban, and he should be bridged if anticoagulation needed to be held for  any procedure -rate control with carvedilol -unlikely to remain in sinus rhythm with cardioversion given the size of his atrium -with hematuria, we made need to weigh if anticoagulation needs to be held. He is being evaluated by his PCP for hematuria. If he develops life threatening anemia, will need to hold [facility]. However, given his high chadsvasc score, stopping anticoagulation comes with significant risk of stroke. Will follow, he will call us if cessation of anticoagulation is discussed.  Hyperlipidemia: continue pravastatin, at goal of LDL <70.   COVID-19 Education: The signs and symptoms of COVID-19 were discussed with the patient and how to seek care for testing (follow up with PCP or arrange E-visit).  The importance of social distancing was discussed today.  Time:   Today, I have spent 19 minutes with the patient with telehealth technology discussing the above problems.    Patient Instructions  Medication Instructions:  Your Physician recommend you  continue on your current medication as directed.    If you need a refill on your cardiac medications before your next appointment, please call your pharmacy.   Lab work: None  Testing/Procedures: None  Follow-Up: At [facility], you and your health needs are our priority.  As part of our continuing mission to provide you with exceptional heart care, we have created designated Provider Care Teams.  These Care Teams include your primary Cardiologist (physician) and Advanced Practice Providers (APPs -  Physician Assistants and Nurse Practitioners) who all work together to provide you with the care you need, when you need it. You will need a follow up appointment in 3 months.  Please call our office 2 months in advance to schedule this appointment.  You may see Jodelle Red, MD or one of the following Advanced Practice Providers on your designated Care Team:   Theodore Demark, PA-C . Joni Reining, DNP, ANP       Medication Adjustments/Labs and Tests Ordered: Current medicines are reviewed at length with the patient today.  Concerns regarding medicines are outlined above.   Tests Ordered: No orders of the defined types were placed in this encounter.   Medication Changes: No orders of the defined types were placed in this encounter.   Disposition:  Follow up 3 mos, will need to follow up on hematuria workup  Signed, Jodelle Red, MD  07/23/2018  [facility]

## 2018-07-24 ENCOUNTER — Telehealth: Payer: Self-pay | Admitting: Cardiology

## 2018-07-24 ENCOUNTER — Other Ambulatory Visit: Payer: Self-pay | Admitting: Physical Medicine and Rehabilitation

## 2018-07-24 NOTE — Telephone Encounter (Signed)
Lmtcb.  Please schedule follow up visit with Dr, Cristal Deer in August..He had televisit on May 5 and needs 3 months follow up.

## 2018-07-24 NOTE — Telephone Encounter (Signed)
Recieved electronic medication refill request for apixaban (eliquis) 5mg  tabs.  Noted on patients chart that the medication was refilled on 03-20-2018 with 11 refills then sent to [facility] by the patients cardiologist.  This request is coming in from [facility] on [address] in [place] .

## 2018-07-29 ENCOUNTER — Encounter: Payer: Self-pay | Admitting: Cardiology

## 2018-07-30 DIAGNOSIS — F329 Major depressive disorder, single episode, unspecified: Secondary | ICD-10-CM | POA: Diagnosis not present

## 2018-07-30 DIAGNOSIS — I509 Heart failure, unspecified: Secondary | ICD-10-CM | POA: Diagnosis not present

## 2018-07-31 DIAGNOSIS — H353111 Nonexudative age-related macular degeneration, right eye, early dry stage: Secondary | ICD-10-CM | POA: Diagnosis not present

## 2018-07-31 DIAGNOSIS — Z7984 Long term (current) use of oral hypoglycemic drugs: Secondary | ICD-10-CM | POA: Diagnosis not present

## 2018-07-31 DIAGNOSIS — H2513 Age-related nuclear cataract, bilateral: Secondary | ICD-10-CM | POA: Diagnosis not present

## 2018-07-31 DIAGNOSIS — E119 Type 2 diabetes mellitus without complications: Secondary | ICD-10-CM | POA: Diagnosis not present

## 2018-08-04 DIAGNOSIS — I509 Heart failure, unspecified: Secondary | ICD-10-CM | POA: Diagnosis not present

## 2018-08-04 DIAGNOSIS — Z79899 Other long term (current) drug therapy: Secondary | ICD-10-CM | POA: Diagnosis not present

## 2018-08-04 DIAGNOSIS — I4891 Unspecified atrial fibrillation: Secondary | ICD-10-CM | POA: Diagnosis not present

## 2018-08-04 DIAGNOSIS — E782 Mixed hyperlipidemia: Secondary | ICD-10-CM | POA: Diagnosis not present

## 2018-08-04 DIAGNOSIS — F329 Major depressive disorder, single episode, unspecified: Secondary | ICD-10-CM | POA: Diagnosis not present

## 2018-08-04 DIAGNOSIS — Z6832 Body mass index (BMI) 32.0-32.9, adult: Secondary | ICD-10-CM | POA: Diagnosis not present

## 2018-08-04 DIAGNOSIS — E118 Type 2 diabetes mellitus with unspecified complications: Secondary | ICD-10-CM | POA: Diagnosis not present

## 2018-08-04 DIAGNOSIS — R319 Hematuria, unspecified: Secondary | ICD-10-CM | POA: Diagnosis not present

## 2018-08-05 DIAGNOSIS — H25813 Combined forms of age-related cataract, bilateral: Secondary | ICD-10-CM | POA: Diagnosis not present

## 2018-08-08 ENCOUNTER — Encounter: Payer: Medicare HMO | Admitting: Physical Medicine & Rehabilitation

## 2018-08-15 ENCOUNTER — Other Ambulatory Visit: Payer: Self-pay

## 2018-08-15 ENCOUNTER — Encounter: Payer: Medicare HMO | Attending: Physical Medicine & Rehabilitation | Admitting: Physical Medicine & Rehabilitation

## 2018-08-15 ENCOUNTER — Encounter: Payer: Self-pay | Admitting: Physical Medicine & Rehabilitation

## 2018-08-15 DIAGNOSIS — R269 Unspecified abnormalities of gait and mobility: Secondary | ICD-10-CM

## 2018-08-15 DIAGNOSIS — I4891 Unspecified atrial fibrillation: Secondary | ICD-10-CM | POA: Insufficient documentation

## 2018-08-15 DIAGNOSIS — J449 Chronic obstructive pulmonary disease, unspecified: Secondary | ICD-10-CM | POA: Insufficient documentation

## 2018-08-15 DIAGNOSIS — R531 Weakness: Secondary | ICD-10-CM | POA: Insufficient documentation

## 2018-08-15 DIAGNOSIS — I10 Essential (primary) hypertension: Secondary | ICD-10-CM | POA: Insufficient documentation

## 2018-08-15 DIAGNOSIS — R5383 Other fatigue: Secondary | ICD-10-CM

## 2018-08-15 DIAGNOSIS — Z8249 Family history of ischemic heart disease and other diseases of the circulatory system: Secondary | ICD-10-CM | POA: Insufficient documentation

## 2018-08-15 DIAGNOSIS — I69354 Hemiplegia and hemiparesis following cerebral infarction affecting left non-dominant side: Secondary | ICD-10-CM | POA: Diagnosis not present

## 2018-08-15 DIAGNOSIS — Z0100 Encounter for examination of eyes and vision without abnormal findings: Secondary | ICD-10-CM | POA: Diagnosis not present

## 2018-08-15 DIAGNOSIS — Z7901 Long term (current) use of anticoagulants: Secondary | ICD-10-CM | POA: Insufficient documentation

## 2018-08-15 DIAGNOSIS — F329 Major depressive disorder, single episode, unspecified: Secondary | ICD-10-CM | POA: Insufficient documentation

## 2018-08-15 DIAGNOSIS — I951 Orthostatic hypotension: Secondary | ICD-10-CM

## 2018-08-15 DIAGNOSIS — Z8673 Personal history of transient ischemic attack (TIA), and cerebral infarction without residual deficits: Secondary | ICD-10-CM | POA: Insufficient documentation

## 2018-08-15 DIAGNOSIS — K219 Gastro-esophageal reflux disease without esophagitis: Secondary | ICD-10-CM | POA: Insufficient documentation

## 2018-08-15 DIAGNOSIS — Z87891 Personal history of nicotine dependence: Secondary | ICD-10-CM | POA: Insufficient documentation

## 2018-08-15 DIAGNOSIS — E119 Type 2 diabetes mellitus without complications: Secondary | ICD-10-CM | POA: Insufficient documentation

## 2018-08-15 NOTE — Progress Notes (Signed)
Subjective:    Patient ID: Chad Franklin, male    DOB: April 12, 1934, 84 y.o.   MRN: [id number]  TELEHEALTH NOTE  Due to national recommendations of social distancing due to COVID 19, an audio/video telehealth visit is felt to be most appropriate for this patient at this time.  See Chart message from today for the patient's consent to telehealth from [facility].     I verified that I am speaking with the correct person using two identifiers.  Location of patient: Home Location of provider: Office Method of communication: Telephone Names of participants : Wadie Lessen scheduling, Silas [place] obtaining consent and vitals if available Established patient Time spent on call: 17 minutes  HPI  Male with history of COPD, diet-controlled DM, prior CVA, A. fib on Coumadin presents for follow up for cortical/subcortical infarct in posterior right frontal lobe and remote left parieto-occipital infarct.  Last clinic visit 06/30/2018.  Wife supplements history. Since that time, patient had an appointment with Cards.Wife and Husband limited historians and patient does not prefer wife speak for him. Unclear if he tried Amantadine. He is slowly transitioning between postures, unless he is upset.  Denies falls. He has increased water intake, later states he is not drinking as much as he should. Encouraged next visit with daughter, who is more aware of medications and functional status.   Pain Inventory Average Pain 0 Pain Right Now 0 My pain is no pain  In the last 24 hours, has pain interfered with the following? General activity 0 Relation with others 0 Enjoyment of life 0 What TIME of day is your pain at its worst? no pain Sleep (in general) Good  Pain is worse with: no pain Pain improves with: no pain Relief from Meds: no pain  Mobility walk without assistance use a walker  Function retired  Neuro/Psych weakness numbness tremor tingling trouble  walking dizziness confusion depression  Prior Studies Any changes since last visit?  no    Physicians involved in your care Any changes since last visit?  yes Primary care Dr. Lonie Peak   Family History  Problem Relation Age of Onset  . Hypertension Father   . Stroke Sister    Social History   Socioeconomic History  . Marital status: Married    Spouse name: Not on file  . Number of children: Not on file  . Years of education: Not on file  . Highest education level: Not on file  Occupational History  . Not on file  Social Needs  . Financial resource strain: Not on file  . Food insecurity:    Worry: Not on file    Inability: Not on file  . Transportation needs:    Medical: Not on file    Non-medical: Not on file  Tobacco Use  . Smoking status: Former Games developer  . Smokeless tobacco: Never Used  Substance and Sexual Activity  . Alcohol use: No    Comment: past drinker  . Drug use: Not Currently  . Sexual activity: Not on file  Lifestyle  . Physical activity:    Days per week: Not on file    Minutes per session: Not on file  . Stress: Not on file  Relationships  . Social connections:    Talks on phone: Not on file    Gets together: Not on file    Attends religious service: Not on file    Active member of club or organization: Not on file  Attends meetings of clubs or organizations: Not on file    Relationship status: Not on file  Other Topics Concern  . Not on file  Social History Narrative  . Not on file   Past Surgical History:  Procedure Laterality Date  . GALLBLADDER SURGERY    . HERNIA REPAIR     Past Medical History:  Diagnosis Date  . Bilateral swelling of feet   . COPD (chronic obstructive pulmonary disease) (HCC)   . Depression   . Diabetes mellitus   . GERD (gastroesophageal reflux disease)   . Palpitations   . Stroke [facility])    2   There were no vitals taken for this visit.  Opioid Risk Score:   Fall Risk Score:  `1  Depression  screen PHQ 2/9  Depression screen [facility] 2/9 08/15/2018 06/30/2018 02/21/2018 12/20/2017  Decreased Interest 0 0 0 1  Down, Depressed, Hopeless 0 0 0 0  PHQ - 2 Score 0 0 0 1   Review of Systems  Constitutional: Positive for appetite change.  HENT: Negative.   Eyes: Negative.   Respiratory: Negative.   Cardiovascular: Negative.   Gastrointestinal: Negative.   Endocrine: Negative.   Genitourinary: Negative.   Musculoskeletal: Positive for arthralgias and gait problem.  Skin: Negative.   Allergic/Immunologic: Negative.   Neurological: Positive for dizziness and weakness.  Hematological: Bruises/bleeds easily.       Eliquis  Psychiatric/Behavioral: Positive for dysphoric mood.  All other systems reviewed and are negative.     Objective:   Physical Exam Gen: NAD. Pulm: Effort normal Neuro: Alert and oriented    Assessment & Plan:  Male with history of COPD, diet-controlled DM, prior CVA, A. fib on Coumadin presents for follow up for cortical/subcortical infarct in posterior right frontal lobe and remote left parieto-occipital infarct.  1. Left-sided weakness secondary to small acute cortical/subcortical infarct and posterior right frontal lobe and remote left parieto-occipital infarct.  Continues to have weakness and fatigue             Cont HEP  Cont follow up with Neurology  Cont follow up with Cards  Cont resting hand splint   Retrial amantadine - dizziness likely secondary to accidentally doubling BP meds and orthostasis, ?unsure if patient has tried  2. Abnormality of gait  Discussed slow postural transitions again, patient does at times  Encouraged water intake specifically, encouraged again  3. Orthostasis  Likley strong contributor to dizziness  Encourage fluid intake again

## 2018-08-19 ENCOUNTER — Telehealth: Payer: Self-pay

## 2018-08-19 NOTE — Telephone Encounter (Signed)
Unable to get in contact with the patient's daughter to r/s hs appt and to convert it into a doxy.me visit. I left a voicemail asking her to return my call. Office number was provided.  If patient's daughter calls back please r/s and convert their appt into a doxy.me visit with Shanda Bumps.

## 2018-08-21 NOTE — Telephone Encounter (Signed)
Left 2nd vm for Steward Drone that pts appt will be cancel for 6/5 due to office closed. Please call back to r/s video visit with Shanda Bumps NP due to COVID 19.

## 2018-08-22 ENCOUNTER — Ambulatory Visit: Payer: Medicare HMO | Admitting: Adult Health

## 2018-09-11 NOTE — Telephone Encounter (Signed)
Opened in error

## 2018-09-18 DIAGNOSIS — R531 Weakness: Secondary | ICD-10-CM | POA: Diagnosis not present

## 2018-09-18 DIAGNOSIS — I509 Heart failure, unspecified: Secondary | ICD-10-CM | POA: Diagnosis not present

## 2018-09-18 DIAGNOSIS — R319 Hematuria, unspecified: Secondary | ICD-10-CM | POA: Diagnosis not present

## 2018-09-18 DIAGNOSIS — I959 Hypotension, unspecified: Secondary | ICD-10-CM | POA: Diagnosis not present

## 2018-09-18 DIAGNOSIS — I4891 Unspecified atrial fibrillation: Secondary | ICD-10-CM | POA: Diagnosis not present

## 2018-09-18 DIAGNOSIS — Z6832 Body mass index (BMI) 32.0-32.9, adult: Secondary | ICD-10-CM | POA: Diagnosis not present

## 2018-10-10 DIAGNOSIS — R531 Weakness: Secondary | ICD-10-CM | POA: Diagnosis not present

## 2018-10-10 DIAGNOSIS — I4891 Unspecified atrial fibrillation: Secondary | ICD-10-CM | POA: Diagnosis not present

## 2018-10-10 DIAGNOSIS — Z6831 Body mass index (BMI) 31.0-31.9, adult: Secondary | ICD-10-CM | POA: Diagnosis not present

## 2018-10-10 DIAGNOSIS — I959 Hypotension, unspecified: Secondary | ICD-10-CM | POA: Diagnosis not present

## 2018-10-10 DIAGNOSIS — I509 Heart failure, unspecified: Secondary | ICD-10-CM | POA: Diagnosis not present

## 2018-10-17 ENCOUNTER — Encounter: Payer: Self-pay | Admitting: Physical Medicine & Rehabilitation

## 2018-10-17 ENCOUNTER — Encounter: Payer: Medicare HMO | Attending: Physical Medicine & Rehabilitation | Admitting: Physical Medicine & Rehabilitation

## 2018-10-17 ENCOUNTER — Other Ambulatory Visit: Payer: Self-pay

## 2018-10-17 VITALS — BP 157/97 | HR 89 | Temp 98.0°F | Ht 68.0 in | Wt 219.0 lb

## 2018-10-17 DIAGNOSIS — K219 Gastro-esophageal reflux disease without esophagitis: Secondary | ICD-10-CM | POA: Diagnosis not present

## 2018-10-17 DIAGNOSIS — I69354 Hemiplegia and hemiparesis following cerebral infarction affecting left non-dominant side: Secondary | ICD-10-CM | POA: Diagnosis not present

## 2018-10-17 DIAGNOSIS — Z87891 Personal history of nicotine dependence: Secondary | ICD-10-CM | POA: Diagnosis not present

## 2018-10-17 DIAGNOSIS — I951 Orthostatic hypotension: Secondary | ICD-10-CM | POA: Diagnosis not present

## 2018-10-17 DIAGNOSIS — E119 Type 2 diabetes mellitus without complications: Secondary | ICD-10-CM | POA: Diagnosis not present

## 2018-10-17 DIAGNOSIS — I10 Essential (primary) hypertension: Secondary | ICD-10-CM | POA: Diagnosis not present

## 2018-10-17 DIAGNOSIS — J449 Chronic obstructive pulmonary disease, unspecified: Secondary | ICD-10-CM | POA: Insufficient documentation

## 2018-10-17 DIAGNOSIS — R5383 Other fatigue: Secondary | ICD-10-CM | POA: Diagnosis not present

## 2018-10-17 DIAGNOSIS — Z8673 Personal history of transient ischemic attack (TIA), and cerebral infarction without residual deficits: Secondary | ICD-10-CM | POA: Diagnosis not present

## 2018-10-17 DIAGNOSIS — F329 Major depressive disorder, single episode, unspecified: Secondary | ICD-10-CM | POA: Diagnosis not present

## 2018-10-17 DIAGNOSIS — R269 Unspecified abnormalities of gait and mobility: Secondary | ICD-10-CM | POA: Diagnosis not present

## 2018-10-17 DIAGNOSIS — I4891 Unspecified atrial fibrillation: Secondary | ICD-10-CM | POA: Diagnosis not present

## 2018-10-17 DIAGNOSIS — R0989 Other specified symptoms and signs involving the circulatory and respiratory systems: Secondary | ICD-10-CM

## 2018-10-17 DIAGNOSIS — Z7901 Long term (current) use of anticoagulants: Secondary | ICD-10-CM | POA: Diagnosis not present

## 2018-10-17 DIAGNOSIS — R531 Weakness: Secondary | ICD-10-CM | POA: Diagnosis not present

## 2018-10-17 DIAGNOSIS — Z8249 Family history of ischemic heart disease and other diseases of the circulatory system: Secondary | ICD-10-CM | POA: Insufficient documentation

## 2018-10-17 MED ORDER — METHYLPHENIDATE HCL 5 MG PO TABS: 5.0000 mg | ORAL_TABLET | Freq: Two times a day (BID) | ORAL | 0 refills | Status: DC

## 2018-10-17 NOTE — Progress Notes (Signed)
Subjective:    Patient ID: Chad Franklin, male    DOB: Jul 20, 1934, 84 y.o.   MRN: [id number]  HPI  Male with history of COPD, diet-controlled DM, prior CVA, A. fib on Coumadin presents for follow up for cortical/subcortical infarct in posterior right frontal lobe and remote left parieto-occipital infarct.  Last clinic visit 08/15/2018.  Daughter provides history. Since that time, he continues to do HEP. He continues to have fatigue. He is following up with Neuro/Cards. He is wearing resting hand splint at night. BP were adjusted by PCP.  BP is elevated in office, however, WNL at home.  Denies falls. Discussed water again vs substitution with orange juice and Gatorade.  Discussed dehydration.  Pain Inventory Average Pain 0 Pain Right Now 0 My pain is no pain  In the last 24 hours, has pain interfered with the following? General activity 0 Relation with others 0 Enjoyment of life 0 What TIME of day is your pain at its worst? no pain Sleep (in general) Good  Pain is worse with: no pain Pain improves with: no pain Relief from Meds: no pain  Mobility walk without assistance ability to climb steps?  yes do you drive?  yes  Function retired  Neuro/Psych weakness dizziness  Prior Studies Any changes since last visit?  no    Physicians involved in your care Any changes since last visit?  yes Primary care Dr. Cyndi Bender   Family History  Problem Relation Age of Onset  . Hypertension Father   . Stroke Sister    Social History   Socioeconomic History  . Marital status: Married    Spouse name: Not on file  . Number of children: Not on file  . Years of education: Not on file  . Highest education level: Not on file  Occupational History  . Not on file  Social Needs  . Financial resource strain: Not on file  . Food insecurity    Worry: Not on file    Inability: Not on file  . Transportation needs    Medical: Not on file    Non-medical: Not on file  Tobacco Use   . Smoking status: Former Research scientist (life sciences)  . Smokeless tobacco: Never Used  Substance and Sexual Activity  . Alcohol use: No    Comment: past drinker  . Drug use: Not Currently  . Sexual activity: Not on file  Lifestyle  . Physical activity    Days per week: Not on file    Minutes per session: Not on file  . Stress: Not on file  Relationships  . Social Herbalist on phone: Not on file    Gets together: Not on file    Attends religious service: Not on file    Active member of club or organization: Not on file    Attends meetings of clubs or organizations: Not on file    Relationship status: Not on file  Other Topics Concern  . Not on file  Social History Narrative  . Not on file   Past Surgical History:  Procedure Laterality Date  . GALLBLADDER SURGERY    . HERNIA REPAIR     Past Medical History:  Diagnosis Date  . Bilateral swelling of feet   . COPD (chronic obstructive pulmonary disease) ([place])   . Depression   . Diabetes mellitus   . GERD (gastroesophageal reflux disease)   . Palpitations   . Stroke ([place])    2   BP [facility])  157/97   Pulse 89   Temp 98 F (36.7 C)   Ht 5\' 8"  (1.727 m)   Wt 219 lb (99.3 kg)   SpO2 94%   BMI 33.30 kg/m   Opioid Risk Score:   Fall Risk Score:  `1  Depression screen PHQ 2/9  Depression screen [facility] 2/9 08/15/2018 06/30/2018 02/21/2018 12/20/2017  Decreased Interest 0 0 0 1  Down, Depressed, Hopeless 0 0 0 0  PHQ - 2 Score 0 0 0 1   Review of Systems  Constitutional: Positive for appetite change.  HENT: Negative.   Eyes: Negative.   Respiratory: Negative.   Cardiovascular: Negative.   Gastrointestinal: Negative.   Endocrine: Negative.   Genitourinary: Negative.   Musculoskeletal: Positive for arthralgias and gait problem.  Skin: Negative.   Allergic/Immunologic: Negative.   Neurological: Positive for dizziness and weakness.  Hematological: Bruises/bleeds easily.       Eliquis  Psychiatric/Behavioral: Positive for  dysphoric mood.  All other systems reviewed and are negative.     Objective:   Physical Exam Constitutional: No distress . Vital signs reviewed. HENT: Normocephalic. Atraumatic.  Eyes: EOMI. No discharge. Cardiovascular:No JVD. Respiratory: Normal effort. GI: Non-distended. Musc: No edema or tenderness in extremities. Gait: Decreased cadence Neurological: He is alert. HOH Motor:  Left upper extremity: 4+/5 shoulder abduction, elbow flexion 4/5, 4-/5 elbow extension, 4-/5 hand grip, 3/5 thumb flex/ext Left lower extremity: 4+/5 proximal to distal Skin: Skin is warm and dry.  Psychiatric: Normal mood. Normal behavior.    Assessment & Plan:  Male with history of COPD, diet-controlled DM, prior CVA, A. fib on Coumadin presents for follow up for cortical/subcortical infarct in posterior right frontal lobe and remote left parieto-occipital infarct.  1. Left-sided weakness secondary to small acute cortical/subcortical infarct and posterior right frontal lobe and remote left parieto-occipital infarct.  Weakness improving  Continues to have fatigue  Amantadine causes dizziness              Cont HEP  Cont follow up with Neurology  Cont follow up with Cards  Cont resting hand splint   Will order ritalin 5 mg BID with breakfast and lunch  2. Abnormality of gait  Discussed slow postural transitions again, patient does at times  Encouraged water intake specifically, encouraged again x3, pt substituting with orange juice and Gatorade, reminded again  3. Orthostasis  Likley strong contributor to dizziness  Encourage fluid intake again x3  4. Elevated BP/hypotensive with PCP  Elevated today, however ,WNL at home per daughter

## 2018-11-03 NOTE — Progress Notes (Signed)
Cardiology Office Note:    Date:  11/04/2018   ID:  Chad Franklin, DOB 17-Oct-1934, MRN [id number]  PCP:  Cyndi Bender, PA-C  Cardiologist:  Buford Dresser, MD PhD  Referring MD: Cyndi Bender, PA-C   CC: Heart failure follow up  History of Present Illness:    Chad Franklin is a 84 y.o. male with a hx of recent CVA (embolic, INR not therapeutic on coumadin despite compliance), type II diabetes, COPD, atrial fibrillation now on apixaban who is followed for atrial fibrillation and systolic heart failure. I saw him initially on 12/19/17.  Cardiac history: admitted with CVA 10/2017, found to have new diagnosis of systolic heart failure during that admission. He was found to be in afib RVR at the time. He was placed on carvedilol and losartan, as well as changed from coumadin to apixaban given the stroke occurred on coumadin. EF did not improve with medical therapy. Had episode of hematuria, followed by PCP.  Today: Last 4 months, has felt weak and tired. BP was running low, stopped losartan and cut back carvedilol. Started on ritalin as well, has felt better.  Has had intermittent chest tightness, he thinks it is reflux. Happens a few times a month, worst when he eats potatoes. Gets sweaty (not only with chest tightness), not lightheaded. No shortness of breath. Better when he drinks something, belches, takes tums.   Doesn't measure BP at home. Does weight at home, has been 215 lbs and steady. No significant swelling. No recent falls.   Denies shortness of breath at rest or with normal exertion. No PND, orthopnea, LE edema or unexpected weight gain. No syncope or palpitations.  Past Medical History:  Diagnosis Date  . Bilateral swelling of feet   . COPD (chronic obstructive pulmonary disease) ([place])   . Depression   . Diabetes mellitus   . GERD (gastroesophageal reflux disease)   . Palpitations   . Stroke [facility])    2    Past Surgical History:  Procedure Laterality Date  .  GALLBLADDER SURGERY    . HERNIA REPAIR      Current Medications: Current Outpatient Medications on File Prior to Visit  Medication Sig  . acetaminophen (TYLENOL) 500 MG tablet Take 1 tablet (500 mg total) by mouth every 8 (eight) hours as needed.  Marland Kitchen ELIQUIS 5 MG TABS tablet TAKE 1 TABLET BY MOUTH TWICE DAILY  . furosemide (LASIX) 80 MG tablet Take 1 tablet (80 mg total) by mouth daily.  . methylphenidate (RITALIN) 5 MG tablet Take 1 tablet (5 mg total) by mouth 2 (two) times daily with breakfast and lunch. (Patient taking differently: Take 5 mg by mouth daily. )  . PARoxetine (PAXIL) 20 MG tablet Take 1 tablet (20 mg total) by mouth daily. (Patient taking differently: Take 40 mg by mouth daily. 2 tablets daily)  . potassium chloride SA (K-DUR,KLOR-CON) 20 MEQ tablet Take 1 tablet (20 mEq total) by mouth daily.  . pravastatin (PRAVACHOL) 10 MG tablet Take 1 tablet (10 mg total) by mouth daily.  Marland Kitchen losartan (COZAAR) 25 MG tablet Take 1 tablet (25 mg total) by mouth daily. (Patient not taking: Reported on 11/04/2018)   No current facility-administered medications on file prior to visit.      Allergies:   Naproxen sodium   Social History   Socioeconomic History  . Marital status: Married    Spouse name: Not on file  . Number of children: Not on file  . Years of education: Not on  file  . Highest education level: Not on file  Occupational History  . Not on file  Social Needs  . Financial resource strain: Not on file  . Food insecurity    Worry: Not on file    Inability: Not on file  . Transportation needs    Medical: Not on file    Non-medical: Not on file  Tobacco Use  . Smoking status: Former Games developermoker  . Smokeless tobacco: Never Used  Substance and Sexual Activity  . Alcohol use: No    Comment: past drinker  . Drug use: Not Currently  . Sexual activity: Not on file  Lifestyle  . Physical activity    Days per week: Not on file    Minutes per session: Not on file  . Stress:  Not on file  Relationships  . Social Musicianconnections    Talks on phone: Not on file    Gets together: Not on file    Attends religious service: Not on file    Active member of club or organization: Not on file    Attends meetings of clubs or organizations: Not on file    Relationship status: Not on file  Other Topics Concern  . Not on file  Social History Narrative  . Not on file     Family History: The patient's family history includes Hypertension in his father; Stroke in his sister.  ROS:   Please see the history of present illness.  Additional pertinent ROS: Constitutional: Negative for chills, fever, night sweats, unintentional weight loss  HENT: Negative for ear pain and hearing loss.   Eyes: Negative for loss of vision and eye pain.  Respiratory: Negative for cough, sputum, wheezing.   Cardiovascular: See HPI. Gastrointestinal: Negative for abdominal pain, melena, and hematochezia.  Genitourinary: Negative for dysuria and hematuria.  Musculoskeletal: Negative for falls and myalgias.  Skin: Negative for itching and rash.  Neurological: Negative for focal weakness, focal sensory changes and loss of consciousness.  Endo/Heme/Allergies: Does not bruise/bleed easily.   EKGs/Labs/Other Studies Reviewed:    The following studies were reviewed today: Echo 03/25/18 - Left ventricle: The cavity size was normal. Wall thickness was   increased in a pattern of mild LVH. Systolic function was   severely reduced. The estimated ejection fraction was in the   range of 25% to 30%. Diffuse hypokinesis. The study is not   technically sufficient to allow evaluation of LV diastolic   function. - Aortic valve: There was trivial regurgitation. - Mitral valve: There was moderate regurgitation. - Right ventricle: Systolic function was mildly reduced. - Tricuspid valve: There was mild regurgitation. - Pulmonary arteries: Systolic pressure was mildly increased. PA   peak pressure: 38 mm Hg (S).   Impressions: - Compared to the prior study, there has been no significant   interval change.   Myoview stress: 11/17/17  FINDINGS: Perfusion: Decreased counts within the apical, mid and basilar segment of the inferior wall which are fixed on rest and stress. Decreased counts in the apical segment of the inferolateral wall which are fixed on rest and stress. No evidence reversible ischemia.  Wall Motion: LEFT ventricle is dilated at rest and stress. Global hypokinesia.  Left Ventricular Ejection Fraction: 28 % End diastolic volume 183 ml End systolic volume 131 ml  IMPRESSION: 1. No reversible ischemia. Fixed defect in the inferolateral wall may represent infarction. Decrease counts in the inferior wall are favored diaphragmatic attenuation.  2. Global hypokinesia. LEFT ventricular dilatation at rest and stress.  3. Left ventricular ejection fraction 28%  4. Non invasive risk stratification*: High (based on low ejection Fraction).  Echo 11/15/17 - Left ventricle: The cavity size was mildly dilated. There was mild focal basal hypertrophy of the septum. Systolic function was moderately reduced. The estimated ejection fraction was in the range of 35% to 40%. - Regional wall motion abnormality: Akinesis of the mid anterior and basal-mid anteroseptal myocardium; hypokinesis of the apical anterior, mid inferoseptal, apical inferior, apical septal, apical lateral, and apical myocardium. - Aortic valve: There was trivial regurgitation. - Mitral valve: Calcified annulus. Mildly thickened leaflets . There was moderate regurgitation. - Left atrium: The atrium was massively dilated. - Right ventricle: The cavity size was moderately dilated. Wall thickness was normal. Systolic function was mildly reduced. - Right atrium: The atrium was moderately dilated. - Atrial septum: No defect or patent foramen ovale was identified. - Tricuspid valve: There was  mild regurgitation. - Pulmonic valve: There was mild regurgitation. - Pulmonary arteries: Systolic pressure was moderately increased. PA peak pressure: 44 mm Hg (S).  Impressions: - Reduced LVEF with wall motion abnormalities across the anteroseptal, anterior, inferoseptal, and apical myocardium. Consider multivessel CAD vs. takotsubo. Massively dilated LA with moderate MR. Elevated PASP of 44 mmHg.  EKG:  EKG is personally reviewed today.  The ekg ordered today demonstrates atrial fibrillation, rate 105 bpm.  Recent Labs: 11/21/2017: ALT 29 11/28/2017: BUN 21; Creatinine, Ser 0.80; Hemoglobin 12.8; Platelets 312; Potassium 4.6; Sodium 138  Recent Lipid Panel    Component Value Date/Time   CHOL 116 11/15/2017 0342   TRIG 54 11/15/2017 0342   HDL 39 (L) 11/15/2017 0342   CHOLHDL 3.0 11/15/2017 0342   VLDL 11 11/15/2017 0342   LDLCALC 66 11/15/2017 0342    Physical Exam:    VS:  BP 120/74   Pulse (!) 110   Temp (!) 97.3 F (36.3 C)   Ht 5\' 9"  (1.753 m)   Wt 217 lb 3.2 oz (98.5 kg)   SpO2 95%   BMI 32.07 kg/m     Wt Readings from Last 3 Encounters:  11/04/18 217 lb 3.2 oz (98.5 kg)  10/17/18 219 lb (99.3 kg)  07/23/18 218 lb (98.9 kg)    GEN: Well nourished, well developed in no acute distress HEENT: Normal, moist mucous membranes NECK: No JVD CARDIAC: irregularly irregular rhythm, normal S1 and S2, no murmurs, rubs, gallops.  VASCULAR: Radial and DP pulses 2+ bilaterally. No carotid bruits RESPIRATORY:  Clear to auscultation without rales, wheezing or rhonchi  ABDOMEN: Soft, non-tender, non-distended MUSCULOSKELETAL:  Ambulates independently SKIN: Warm and dry, no edema NEUROLOGIC:  Alert and oriented x 3. No focal neuro deficits noted. PSYCHIATRIC:  Normal affect   ASSESSMENT:    No diagnosis found. PLAN:    Chronic systolic heart failure: diagnosed 10/2017. Euvolemic today.  -Has history of hypokalemia, continue potassium supplementation -continue  lasix 80 mg daily -he is tachycardic today. Now on stimulant, which may be exacerbating this. Self cut back on carvedilol due to low blood pressure. Will change carvedilol to metoprolol succinate today for better HR control and less BP effect. -if still running fast, will uptitrate at next visit -continue losartan 25 mg daily -no improvement in EF after 3 mos of therapy. No ischemia on stress test. Have discussed ICD, considering -reviewed heart failure education again today.  Permanent atrial fibrillation, with CVA on coumadin (INR subtherapeutic despite compliance), now on apixaban -today in afib RVR -CHA2DS2/VAS Stroke Risk Points = 7  -  with high CV score and history of stroke, continue apixaban, and he should be bridged if anticoagulation needed to be held for any procedure -hematuria improved, monitoring -rate control with metoprolol (changing today) as above -unlikely to remain in sinus rhythm with cardioversion given the size of his atrium  Hyperlipidemia: continue pravastatin, at goal of LDL <70 given CVA history   Plan for follow up: 3-4 weeks  TIME SPENT WITH PATIENT: 20 minutes of direct patient care. More than 50% of that time was spent on coordination of care and counseling regarding medication changes.  Jodelle Red, MD, PhD [place]  [facility]   Medication Adjustments/Labs and Tests Ordered: Current medicines are reviewed at length with the patient today.  Concerns regarding medicines are outlined above.  No orders of the defined types were placed in this encounter.  Meds ordered this encounter  Medications  . metoprolol succinate (TOPROL-XL) 25 MG 24 hr tablet    Sig: Take 1 tablet (25 mg total) by mouth daily. Take with or immediately following a meal.    Dispense:  30 tablet    Refill:  11    Patient Instructions  Medication Instructions:  Stop: Carvedilol 25 mg daily Start: Metoprolol 25 mg daily  If you need a refill on your cardiac  medications before your next appointment, please call your pharmacy.   Lab work: None  Testing/Procedures: None  Follow-Up: At [facility], you and your health needs are our priority.  As part of our continuing mission to provide you with exceptional heart care, we have created designated Provider Care Teams.  These Care Teams include your primary Cardiologist (physician) and Advanced Practice Providers (APPs -  Physician Assistants and Nurse Practitioners) who all work together to provide you with the care you need, when you need it. You will need a virtual follow up appointment in 3-4 weeks.  Please call our office 2 months in advance to schedule this appointment.  You may see Jodelle Red, MD or one of the following Advanced Practice Providers on your designated Care Team:   Theodore Demark, PA-C . Joni Reining, DNP, ANP         Signed, Jodelle Red, MD PhD 11/04/2018 1:10 PM    [facility]

## 2018-11-04 ENCOUNTER — Encounter: Payer: Self-pay | Admitting: Cardiology

## 2018-11-04 ENCOUNTER — Other Ambulatory Visit: Payer: Self-pay

## 2018-11-04 ENCOUNTER — Ambulatory Visit (INDEPENDENT_AMBULATORY_CARE_PROVIDER_SITE_OTHER): Payer: Medicare HMO | Admitting: Cardiology

## 2018-11-04 VITALS — BP 120/74 | HR 110 | Temp 97.3°F | Ht 69.0 in | Wt 217.2 lb

## 2018-11-04 DIAGNOSIS — E7800 Pure hypercholesterolemia, unspecified: Secondary | ICD-10-CM | POA: Diagnosis not present

## 2018-11-04 DIAGNOSIS — I4891 Unspecified atrial fibrillation: Secondary | ICD-10-CM

## 2018-11-04 DIAGNOSIS — I4821 Permanent atrial fibrillation: Secondary | ICD-10-CM

## 2018-11-04 DIAGNOSIS — I5022 Chronic systolic (congestive) heart failure: Secondary | ICD-10-CM | POA: Diagnosis not present

## 2018-11-04 MED ORDER — METOPROLOL SUCCINATE ER 25 MG PO TB24: 25.0000 mg | ORAL_TABLET | Freq: Every day | ORAL | 11 refills | Status: DC

## 2018-11-04 NOTE — Patient Instructions (Addendum)
Medication Instructions:  Stop: Carvedilol 25 mg daily Start: Metoprolol 25 mg daily  If you need a refill on your cardiac medications before your next appointment, please call your pharmacy.   Lab work: None  Testing/Procedures: None  Follow-Up: At [facility], you and your health needs are our priority.  As part of our continuing mission to provide you with exceptional heart care, we have created designated Provider Care Teams.  These Care Teams include your primary Cardiologist (physician) and Advanced Practice Providers (APPs -  Physician Assistants and Nurse Practitioners) who all work together to provide you with the care you need, when you need it. You will need a virtual follow up appointment in 3-4 weeks.  Please call our office 2 months in advance to schedule this appointment.  You may see Buford Dresser, MD or one of the following Advanced Practice Providers on your designated Care Team:   Rosaria Ferries, PA-C . Jory Sims, DNP, ANP

## 2018-11-11 ENCOUNTER — Encounter: Payer: Self-pay | Admitting: Cardiology

## 2018-11-11 DIAGNOSIS — E7800 Pure hypercholesterolemia, unspecified: Secondary | ICD-10-CM | POA: Insufficient documentation

## 2018-11-13 ENCOUNTER — Telehealth: Payer: Self-pay

## 2018-11-13 MED ORDER — METHYLPHENIDATE HCL 5 MG PO TABS: 5.0000 mg | ORAL_TABLET | Freq: Two times a day (BID) | ORAL | 0 refills | Status: DC

## 2018-11-13 NOTE — Telephone Encounter (Signed)
Called and requested refill on Methylphenidate. Next appt 01/02/2019

## 2018-12-05 ENCOUNTER — Encounter: Payer: Self-pay | Admitting: Cardiology

## 2018-12-05 ENCOUNTER — Telehealth (INDEPENDENT_AMBULATORY_CARE_PROVIDER_SITE_OTHER): Payer: Medicare HMO | Admitting: Cardiology

## 2018-12-05 VITALS — BP 148/114

## 2018-12-05 DIAGNOSIS — Z7189 Other specified counseling: Secondary | ICD-10-CM

## 2018-12-05 DIAGNOSIS — I4821 Permanent atrial fibrillation: Secondary | ICD-10-CM

## 2018-12-05 DIAGNOSIS — I5022 Chronic systolic (congestive) heart failure: Secondary | ICD-10-CM | POA: Diagnosis not present

## 2018-12-05 DIAGNOSIS — I4891 Unspecified atrial fibrillation: Secondary | ICD-10-CM | POA: Diagnosis not present

## 2018-12-05 DIAGNOSIS — Z8673 Personal history of transient ischemic attack (TIA), and cerebral infarction without residual deficits: Secondary | ICD-10-CM | POA: Diagnosis not present

## 2018-12-05 DIAGNOSIS — I10 Essential (primary) hypertension: Secondary | ICD-10-CM | POA: Diagnosis not present

## 2018-12-05 MED ORDER — METOPROLOL SUCCINATE ER 50 MG PO TB24: 50.0000 mg | ORAL_TABLET | Freq: Every day | ORAL | 11 refills | Status: DC

## 2018-12-05 NOTE — Patient Instructions (Signed)
Medication Instructions:  Increase Metoprolol to 50 mg daily  If you need a refill on your cardiac medications before your next appointment, please call your pharmacy.   Lab work: None  Testing/Procedures: None  Follow-Up: At [facility], you and your health needs are our priority.  As part of our continuing mission to provide you with exceptional heart care, we have created designated Provider Care Teams.  These Care Teams include your primary Cardiologist (physician) and Advanced Practice Providers (APPs -  Physician Assistants and Nurse Practitioners) who all work together to provide you with the care you need, when you need it. You will need a follow up appointment in 3 months.  Please call our office 2 months in advance to schedule this appointment.  You may see Buford Dresser, MD or one of the following Advanced Practice Providers on your designated Care Team:   Rosaria Ferries, PA-C . Jory Sims, DNP, ANP

## 2018-12-05 NOTE — Progress Notes (Signed)
Virtual Visit via Video Note   This visit type was conducted due to national recommendations for restrictions regarding the COVID-19 Pandemic (e.g. social distancing) in an effort to limit this patient's exposure and mitigate transmission in our community.  Due to his co-morbid illnesses, this patient is at least at moderate risk for complications without adequate follow up.  This format is felt to be most appropriate for this patient at this time.  All issues noted in this document were discussed and addressed.  A limited physical exam was performed with this format.  Please refer to the patient's chart for his consent to telehealth for [facility].   Date:  12/05/2018   ID:  Chad Franklin, DOB July 15, 1934, MRN [id number]  Patient Location: Home Provider Location: Home  PCP:  Lonie Peak, PA-C  Cardiologist:  Jodelle Red, MD  Electrophysiologist:  None   Evaluation Performed:  Follow-Up Visit  Chief Complaint:  Follow up  History of Present Illness:    Chad Franklin is a 84 y.o. male with hx of CVA (embolic, INR not therapeutic on coumadin despite compliance), type II diabetes, COPD, atrial fibrillation now on apixaban who is followed for atrial fibrillation and systolic heart failure. I saw him initially on 12/19/17.  Cardiac history: admitted with CVA 10/2017, found to have new diagnosis of systolic heart failure during that admission. He was found to be in afib RVR at the time. He was placed on carvedilol and losartan, as well as changed from coumadin to apixaban given the stroke occurred on coumadin. EF did not improve with medical therapy. Had episode of hematuria, followed by PCP.  Video visit with his daughter present today. Bps have been 120/70 usually, high today.  HR 107 today, their usual BP cuff doesn't have HR monitor.  Recently, BP had been running very low, PCP took him off the losartan. Started him on ritalin as well, briefly increased his energy level  but now he is back to prior baseline. Overall low energy. No abnormal bruising or bleeding, still with small amount of blood in his urine. No chest pain. Occasional heartburn, has been off PPI for some time. Will trial OTC meds for this, ok from cardiac perspective if he has to go back on PPI.  Reviewed medications at length today. Reviewed echo results, heart function, signs/symptoms to be aware of with heart failure.  Denies chest pain, shortness of breath at rest or with normal exertion. No PND, orthopnea, LE edema or unexpected weight gain. No syncope or palpitations.  I, [name], and his daughter today again discussed ICD. We all decided not to pursue, after discussion of risks/benefits.  Past Medical History:  Diagnosis Date  . Bilateral swelling of feet   . COPD (chronic obstructive pulmonary disease) (HCC)   . Depression   . Diabetes mellitus   . GERD (gastroesophageal reflux disease)   . Palpitations   . Stroke [facility])    2   Past Surgical History:  Procedure Laterality Date  . GALLBLADDER SURGERY    . HERNIA REPAIR       Current Meds  Medication Sig  . acetaminophen (TYLENOL) 500 MG tablet Take 1 tablet (500 mg total) by mouth every 8 (eight) hours as needed.  Marland Kitchen ELIQUIS 5 MG TABS tablet TAKE 1 TABLET BY MOUTH TWICE DAILY  . furosemide (LASIX) 80 MG tablet Take 1 tablet (80 mg total) by mouth daily.  . methylphenidate (RITALIN) 5 MG tablet Take 1 tablet (5 mg total)  by mouth 2 (two) times daily with breakfast and lunch.  . metoprolol succinate (TOPROL-XL) 50 MG 24 hr tablet Take 1 tablet (50 mg total) by mouth daily. Take with or immediately following a meal.  . PARoxetine (PAXIL) 20 MG tablet Take 1 tablet (20 mg total) by mouth daily. (Patient taking differently: Take 40 mg by mouth daily. 2 tablets daily)  . potassium chloride SA (K-DUR,KLOR-CON) 20 MEQ tablet Take 1 tablet (20 mEq total) by mouth daily.  . pravastatin (PRAVACHOL) 10 MG tablet Take 1 tablet (10 mg  total) by mouth daily.  . [DISCONTINUED] metoprolol succinate (TOPROL-XL) 25 MG 24 hr tablet Take 1 tablet (25 mg total) by mouth daily. Take with or immediately following a meal.     Allergies:   Naproxen sodium   Social History   Tobacco Use  . Smoking status: Former Games developermoker  . Smokeless tobacco: Never Used  Substance Use Topics  . Alcohol use: No    Comment: past drinker  . Drug use: Not Currently     Family Hx: The patient's family history includes Hypertension in his father; Stroke in his sister.  ROS:   Please see the history of present illness.    Constitutional: Negative for chills, fever, night sweats, unintentional weight loss  Respiratory: Negative for cough, sputum, wheezing.   Cardiovascular: See HPI. Gastrointestinal: Negative for abdominal pain, melena, and hematochezia.  Genitourinary: Negative for dysuria, positive for chronic mild hematuria.  Musculoskeletal: Negative for falls and myalgias.  Neurological: Negative for focal weakness, focal sensory changes and loss of consciousness.  Endo/Heme/Allergies: Does bruise/bleed easily.  All other systems reviewed and are negative.   Prior CV studies:   The following studies were reviewed today: Echo 03/25/18 - Left ventricle: The cavity size was normal. Wall thickness was increased in a pattern of mild LVH. Systolic function was severely reduced. The estimated ejection fraction was in the range of 25% to 30%. Diffuse hypokinesis. The study is not technically sufficient to allow evaluation of LV diastolic function. - Aortic valve: There was trivial regurgitation. - Mitral valve: There was moderate regurgitation. - Right ventricle: Systolic function was mildly reduced. - Tricuspid valve: There was mild regurgitation. - Pulmonary arteries: Systolic pressure was mildly increased. PA peak pressure: 38 mm Hg (S).  Impressions: - Compared to the prior study, there has been no significant interval  change.   Myoview stress: 11/17/17 FINDINGS: Perfusion: Decreased counts within the apical, mid and basilar segment of the inferior wall which are fixed on rest and stress. Decreased counts in the apical segment of the inferolateral wall which are fixed on rest and stress. No evidence reversible ischemia.  Wall Motion: LEFT ventricle is dilated at rest and stress. Global hypokinesia.  Left Ventricular Ejection Fraction: 28 % End diastolic volume 183 ml End systolic volume 131 ml  IMPRESSION: 1. No reversible ischemia. Fixed defect in the inferolateral wall may represent infarction. Decrease counts in the inferior wall are favored diaphragmatic attenuation.  2. Global hypokinesia. LEFT ventricular dilatation at rest and stress.  3. Left ventricular ejection fraction 28%  4. Non invasive risk stratification*: High (based on low ejection Fraction).  Echo 11/15/17 - Left ventricle: The cavity size was mildly dilated. There was mild focal basal hypertrophy of the septum. Systolic function was moderately reduced. The estimated ejection fraction was in the range of 35% to 40%. - Regional wall motion abnormality: Akinesis of the mid anterior and basal-mid anteroseptal myocardium; hypokinesis of the apical anterior, mid inferoseptal, apical  inferior, apical septal, apical lateral, and apical myocardium. - Aortic valve: There was trivial regurgitation. - Mitral valve: Calcified annulus. Mildly thickened leaflets . There was moderate regurgitation. - Left atrium: The atrium was massively dilated. - Right ventricle: The cavity size was moderately dilated. Wall thickness was normal. Systolic function was mildly reduced. - Right atrium: The atrium was moderately dilated. - Atrial septum: No defect or patent foramen ovale was identified. - Tricuspid valve: There was mild regurgitation. - Pulmonic valve: There was mild regurgitation. - Pulmonary arteries:  Systolic pressure was moderately increased. PA peak pressure: 44 mm Hg (S).  Impressions: - Reduced LVEF with wall motion abnormalities across the anteroseptal, anterior, inferoseptal, and apical myocardium. Consider multivessel CAD vs. takotsubo. Massively dilated LA with moderate MR. Elevated PASP of 44 mmHg.  Labs/Other Tests and Data Reviewed:    EKG:  An ECG dated 11/04/18 was personally reviewed today and demonstrated:  afib RVR  Recent Labs: No results found for requested labs within last 8760 hours.   Recent Lipid Panel Lab Results  Component Value Date/Time   CHOL 116 11/15/2017 03:42 AM   TRIG 54 11/15/2017 03:42 AM   HDL 39 (L) 11/15/2017 03:42 AM   CHOLHDL 3.0 11/15/2017 03:42 AM   LDLCALC 66 11/15/2017 03:42 AM    Wt Readings from Last 3 Encounters:  11/04/18 217 lb 3.2 oz (98.5 kg)  10/17/18 219 lb (99.3 kg)  07/23/18 218 lb (98.9 kg)     Objective:    Vital Signs:  BP (!) 148/114  HR 107   VITAL SIGNS:  reviewed GEN:  no acute distress EYES:  sclerae anicteric, EOMI - Extraocular Movements Intact RESPIRATORY:  normal respiratory effort, symmetric expansion CARDIOVASCULAR:  no peripheral edema SKIN:  no rash, lesions or ulcers. MUSCULOSKELETAL:  no obvious deformities. NEURO:  alert and oriented x 3, no obvious focal deficit PSYCH:  normal affect  ASSESSMENT & PLAN:    Chronic systolic heart failure: diagnosed 10/2017. Appears euvolemic today.  -continue lasix 80 mg daily with potassium supplement (history of hypokalemia) -still tachycardic today. Increasing metoprolol succinate to 50 mg daily. Likely dual effect of afib and ritalin use -losartan had to be stopped due to hypotension -we had a group discussion regarding ICD today. Reviewed data, current guidelines, risks/benefits. All three of us (Mr. Ranae PilaGravely, his daughter, and myself) agree NOT to pursue ICD. -reviewed heart failure education again today. Specifically focused on what to watch  for that would suggest heart failure exacerbation  Permanent atrial fibrillation, with CVA on coumadin (INR subtherapeutic despite compliance), now on apixaban -recent ECG afib RVR, with elevated HR today likely still RVR -CHA2DS2/VAS Stroke Risk Points = 7  -with high CV score and history of stroke, continue apixaban, and he should be bridged if anticoagulation needed to be held for any procedure -hematuria stable -rate control with metoprolol (changing today) as above -unlikely to remain in sinus rhythm with cardioversion given the size of his atrium  Hyperlipidemia: continue pravastatin, at goal of LDL <70 given CVA history  COVID-19 Education: The signs and symptoms of COVID-19 were discussed with the patient and how to seek care for testing (follow up with PCP or arrange E-visit).  The importance of social distancing was discussed today.  Time:   Today, I have spent 25 minutes with the patient with telehealth technology discussing the above problems.    Patient Instructions  Medication Instructions:  Increase Metoprolol to 50 mg daily  If you need a refill on your  cardiac medications before your next appointment, please call your pharmacy.   Lab work: None  Testing/Procedures: None  Follow-Up: At [facility], you and your health needs are our priority.  As part of our continuing mission to provide you with exceptional heart care, we have created designated Provider Care Teams.  These Care Teams include your primary Cardiologist (physician) and Advanced Practice Providers (APPs -  Physician Assistants and Nurse Practitioners) who all work together to provide you with the care you need, when you need it. You will need a follow up appointment in 3 months.  Please call our office 2 months in advance to schedule this appointment.  You may see Buford Dresser, MD or one of the following Advanced Practice Providers on your designated Care Team:   Rosaria Ferries, PA-C .  Jory Sims, DNP, ANP       Follow Up:  3 mos or sooner PRN  Signed, Buford Dresser, MD  12/05/2018 11:21 AM    [place]

## 2018-12-12 DIAGNOSIS — I509 Heart failure, unspecified: Secondary | ICD-10-CM | POA: Diagnosis not present

## 2018-12-12 DIAGNOSIS — Z79899 Other long term (current) drug therapy: Secondary | ICD-10-CM | POA: Diagnosis not present

## 2018-12-12 DIAGNOSIS — R319 Hematuria, unspecified: Secondary | ICD-10-CM | POA: Diagnosis not present

## 2018-12-12 DIAGNOSIS — J309 Allergic rhinitis, unspecified: Secondary | ICD-10-CM | POA: Diagnosis not present

## 2018-12-12 DIAGNOSIS — E782 Mixed hyperlipidemia: Secondary | ICD-10-CM | POA: Diagnosis not present

## 2018-12-12 DIAGNOSIS — Z23 Encounter for immunization: Secondary | ICD-10-CM | POA: Diagnosis not present

## 2018-12-12 DIAGNOSIS — F329 Major depressive disorder, single episode, unspecified: Secondary | ICD-10-CM | POA: Diagnosis not present

## 2018-12-12 DIAGNOSIS — I4891 Unspecified atrial fibrillation: Secondary | ICD-10-CM | POA: Diagnosis not present

## 2018-12-12 DIAGNOSIS — E118 Type 2 diabetes mellitus with unspecified complications: Secondary | ICD-10-CM | POA: Diagnosis not present

## 2018-12-19 ENCOUNTER — Telehealth: Payer: Self-pay

## 2018-12-19 NOTE — Telephone Encounter (Signed)
Hassan Rowan called in regards of this patient.  States he needs a refill of methylphenidate medication.

## 2018-12-22 MED ORDER — METHYLPHENIDATE HCL 5 MG PO TABS: 5.0000 mg | ORAL_TABLET | Freq: Two times a day (BID) | ORAL | 0 refills | Status: DC

## 2018-12-22 NOTE — Telephone Encounter (Signed)
Refilled until appointment.  Thanks.

## 2018-12-26 ENCOUNTER — Ambulatory Visit: Payer: Medicare HMO | Admitting: Physical Medicine & Rehabilitation

## 2019-01-02 ENCOUNTER — Encounter: Payer: Medicare HMO | Admitting: Physical Medicine & Rehabilitation

## 2019-01-16 ENCOUNTER — Encounter: Payer: Medicare HMO | Attending: Physical Medicine & Rehabilitation | Admitting: Physical Medicine & Rehabilitation

## 2019-01-16 ENCOUNTER — Other Ambulatory Visit: Payer: Self-pay

## 2019-01-16 ENCOUNTER — Encounter: Payer: Self-pay | Admitting: Physical Medicine & Rehabilitation

## 2019-01-16 VITALS — BP 128/92 | HR 100 | Temp 97.5°F | Ht 68.0 in | Wt 226.0 lb

## 2019-01-16 DIAGNOSIS — K219 Gastro-esophageal reflux disease without esophagitis: Secondary | ICD-10-CM | POA: Diagnosis not present

## 2019-01-16 DIAGNOSIS — J449 Chronic obstructive pulmonary disease, unspecified: Secondary | ICD-10-CM | POA: Insufficient documentation

## 2019-01-16 DIAGNOSIS — I4891 Unspecified atrial fibrillation: Secondary | ICD-10-CM | POA: Insufficient documentation

## 2019-01-16 DIAGNOSIS — Z8673 Personal history of transient ischemic attack (TIA), and cerebral infarction without residual deficits: Secondary | ICD-10-CM | POA: Insufficient documentation

## 2019-01-16 DIAGNOSIS — R269 Unspecified abnormalities of gait and mobility: Secondary | ICD-10-CM | POA: Diagnosis not present

## 2019-01-16 DIAGNOSIS — I951 Orthostatic hypotension: Secondary | ICD-10-CM

## 2019-01-16 DIAGNOSIS — I10 Essential (primary) hypertension: Secondary | ICD-10-CM | POA: Insufficient documentation

## 2019-01-16 DIAGNOSIS — E119 Type 2 diabetes mellitus without complications: Secondary | ICD-10-CM | POA: Diagnosis not present

## 2019-01-16 DIAGNOSIS — Z7901 Long term (current) use of anticoagulants: Secondary | ICD-10-CM | POA: Insufficient documentation

## 2019-01-16 DIAGNOSIS — R531 Weakness: Secondary | ICD-10-CM | POA: Diagnosis not present

## 2019-01-16 DIAGNOSIS — Z8249 Family history of ischemic heart disease and other diseases of the circulatory system: Secondary | ICD-10-CM | POA: Insufficient documentation

## 2019-01-16 DIAGNOSIS — F329 Major depressive disorder, single episode, unspecified: Secondary | ICD-10-CM | POA: Diagnosis not present

## 2019-01-16 DIAGNOSIS — Z87891 Personal history of nicotine dependence: Secondary | ICD-10-CM | POA: Insufficient documentation

## 2019-01-16 DIAGNOSIS — I69354 Hemiplegia and hemiparesis following cerebral infarction affecting left non-dominant side: Secondary | ICD-10-CM

## 2019-01-16 MED ORDER — METHYLPHENIDATE HCL 10 MG PO TABS: 5.0000 mg | ORAL_TABLET | Freq: Two times a day (BID) | ORAL | 0 refills | Status: DC

## 2019-01-16 NOTE — Progress Notes (Signed)
Subjective:    Patient ID: Chad Franklin, male    DOB: 1935/03/17, 84 y.o.   MRN: [id number]  HPI  Male with history of COPD, diet-controlled DM, prior CVA, A. fib on Coumadin presents for follow up for cortical/subcortical infarct in posterior right frontal lobe and remote left parieto-occipital infarct.  Last clinic visit 10/17/2018.  Daughter supplements history. Since that time, he notes persistent weakness. Daughter believes he is following up with Neurology. He states he wears his WHO, but daughter states he does not.  He had temporary benefit with ritalin.  He states he has increased his water intake. He states he still has dizziness. Denies falls.  Pain Inventory Average Pain 0 Pain Right Now 0 My pain is no pain  In the last 24 hours, has pain interfered with the following? General activity 0 Relation with others 0 Enjoyment of life 0 What TIME of day is your pain at its worst? no pain Sleep (in general) Good  Pain is worse with: no pain Pain improves with: no pain Relief from Meds: no pain  Mobility walk without assistance ability to climb steps?  yes do you drive?  yes  Function retired  Neuro/Psych trouble walking  Prior Studies Any changes since last visit?  no    Physicians involved in your care Any changes since last visit?  no   Family History  Problem Relation Age of Onset  . Hypertension Father   . Stroke Sister    Social History   Socioeconomic History  . Marital status: Married    Spouse name: Not on file  . Number of children: Not on file  . Years of education: Not on file  . Highest education level: Not on file  Occupational History  . Not on file  Social Needs  . Financial resource strain: Not on file  . Food insecurity    Worry: Not on file    Inability: Not on file  . Transportation needs    Medical: Not on file    Non-medical: Not on file  Tobacco Use  . Smoking status: Former Games developer  . Smokeless tobacco: Never Used   Substance and Sexual Activity  . Alcohol use: No    Comment: past drinker  . Drug use: Not Currently  . Sexual activity: Not on file  Lifestyle  . Physical activity    Days per week: Not on file    Minutes per session: Not on file  . Stress: Not on file  Relationships  . Social Musician on phone: Not on file    Gets together: Not on file    Attends religious service: Not on file    Active member of club or organization: Not on file    Attends meetings of clubs or organizations: Not on file    Relationship status: Not on file  Other Topics Concern  . Not on file  Social History Narrative  . Not on file   Past Surgical History:  Procedure Laterality Date  . GALLBLADDER SURGERY    . HERNIA REPAIR     Past Medical History:  Diagnosis Date  . Bilateral swelling of feet   . COPD (chronic obstructive pulmonary disease) (HCC)   . Depression   . Diabetes mellitus   . GERD (gastroesophageal reflux disease)   . Palpitations   . Stroke (HCC)    2   BP (!) 128/92   Pulse 100   Temp (!) 97.5 F (36.4  C)   Ht 5\' 8"  (1.727 m)   Wt 226 lb (102.5 kg)   SpO2 96%   BMI 34.36 kg/m   Opioid Risk Score:   Fall Risk Score:  `1  Depression screen PHQ 2/9  Depression screen [facility] 2/9 08/15/2018 06/30/2018 02/21/2018 12/20/2017  Decreased Interest 0 0 0 1  Down, Depressed, Hopeless 0 0 0 0  PHQ - 2 Score 0 0 0 1   Review of Systems  HENT: Negative.   Eyes: Negative.   Respiratory: Negative.   Cardiovascular: Negative.   Gastrointestinal: Negative.   Endocrine: Negative.   Genitourinary: Negative.   Musculoskeletal: Positive for gait problem.  Skin: Negative.   Allergic/Immunologic: Negative.   Neurological: Positive for dizziness and weakness. Negative for numbness.  Hematological: Negative.        Eliquis  Psychiatric/Behavioral: Negative.       Objective:   Physical Exam  Constitutional: No distress . Vital signs reviewed. HENT: Normocephalic.  Atraumatic.  Eyes: EOMI. No discharge. Cardiovascular: No JVD. Respiratory: Normal effort.  No stridor. GI: Non-distended. Skin: Warm and dry.  Intact. Psych: Normal mood.  Normal behavior. Musc: No edema in extremities.  No tenderness in extremities. Neurological: Alert. HOH Motor:  Left upper extremity: 4+/5 shoulder abduction, elbow flexion 4/5, 4-/5 elbow extension, 4-/5 hand grip, 3/5 thumb flex/ext, unchanged Left lower extremity: 4+/5 proximal to distal, unchanged Skin: Skin is warm and dry.  Psychiatric: Normal mood. Normal behavior.    Assessment & Plan:  Male with history of COPD, diet-controlled DM, prior CVA, A. fib on Coumadin presents for follow up for cortical/subcortical infarct in posterior right frontal lobe and remote left parieto-occipital infarct.  1. Left-sided weakness secondary to small acute cortical/subcortical infarct and posterior right frontal lobe and remote left parieto-occipital infarct.  Continues to have fatigue  Amantadine causes dizziness              Cont HEP  Cont follow up with Neurology  Cont follow up with Cards  Cont resting hand splint, encouraged complaince  Will restart ritalin 5mg  BID (has not taken in nearly 2 months) with breakfast and lunch, encouraged BP checks - discussed with daughter  2. Abnormality of gait  Discussed slow postural transitions again, patient does at times  Encouraged water intake specifically, encouraged again x3, pt substituting with orange juice and Gatorade, reminded again  Improving  3. Orthostasis  Likley strong contributor to dizziness  Encourage fluid intake again x3  Improving

## 2019-02-10 ENCOUNTER — Encounter: Payer: Medicare HMO | Admitting: Physical Medicine & Rehabilitation

## 2019-02-16 ENCOUNTER — Encounter (HOSPITAL_COMMUNITY): Payer: Self-pay | Admitting: Emergency Medicine

## 2019-02-16 ENCOUNTER — Emergency Department (HOSPITAL_COMMUNITY)
Admission: EM | Admit: 2019-02-16 | Discharge: 2019-02-16 | Disposition: A | Discharge status: Home / Self Care | Payer: Medicare HMO | Attending: Emergency Medicine | Admitting: Emergency Medicine

## 2019-02-16 ENCOUNTER — Other Ambulatory Visit: Payer: Self-pay

## 2019-02-16 DIAGNOSIS — R531 Weakness: Secondary | ICD-10-CM | POA: Diagnosis not present

## 2019-02-16 DIAGNOSIS — R5383 Other fatigue: Secondary | ICD-10-CM | POA: Diagnosis not present

## 2019-02-16 DIAGNOSIS — Z20828 Contact with and (suspected) exposure to other viral communicable diseases: Secondary | ICD-10-CM | POA: Diagnosis not present

## 2019-02-16 DIAGNOSIS — Z5321 Procedure and treatment not carried out due to patient leaving prior to being seen by health care provider: Secondary | ICD-10-CM | POA: Insufficient documentation

## 2019-02-16 DIAGNOSIS — R443 Hallucinations, unspecified: Secondary | ICD-10-CM | POA: Insufficient documentation

## 2019-02-16 LAB — BASIC METABOLIC PANEL
Anion gap: 10 (ref 5–15)
BUN: 20 mg/dL (ref 8–23)
CO2: 29 mmol/L (ref 22–32)
Calcium: 8.7 mg/dL — ABNORMAL LOW (ref 8.9–10.3)
Chloride: 102 mmol/L (ref 98–111)
Creatinine, Ser: 1.17 mg/dL (ref 0.61–1.24)
GFR calc Af Amer: >60 mL/min (ref >60)
GFR calc non Af Amer: 58 mL/min — ABNORMAL LOW (ref >60)
Glucose, Bld: 104 mg/dL — ABNORMAL HIGH (ref 70–99)
Potassium: 3.5 mmol/L (ref 3.5–5.1)
Sodium: 141 mmol/L (ref 135–145)

## 2019-02-16 LAB — CBC
HCT: 42.6 % (ref 39.0–52.0)
Hemoglobin: 12.9 g/dL — ABNORMAL LOW (ref 13.0–17.0)
MCH: 28.9 pg (ref 26.0–34.0)
MCHC: 30.3 g/dL (ref 30.0–36.0)
MCV: 95.5 fL (ref 80.0–100.0)
Platelets: 266 10*3/uL (ref 150–400)
RBC: 4.46 MIL/uL (ref 4.22–5.81)
RDW: 14.7 % (ref 11.5–15.5)
WBC: 6.5 10*3/uL (ref 4.0–10.5)
nRBC: 0 % (ref 0.0–0.2)

## 2019-02-16 MED ORDER — SODIUM CHLORIDE 0.9% FLUSH: 3.0000 mL | Freq: Once | INTRAVENOUS | Status: DC

## 2019-02-16 NOTE — ED Triage Notes (Signed)
Pt arrives from home- with daughter who reports over the last few weeks he has been weak and has been difficult for him to walk, daughter also reports hallucinations over the last 1 month with difficultly sleeping.

## 2019-02-16 NOTE — ED Notes (Signed)
Pt left AMA with grandfather.

## 2019-02-17 DIAGNOSIS — R443 Hallucinations, unspecified: Secondary | ICD-10-CM | POA: Diagnosis not present

## 2019-02-17 DIAGNOSIS — I509 Heart failure, unspecified: Secondary | ICD-10-CM | POA: Diagnosis not present

## 2019-02-17 DIAGNOSIS — R05 Cough: Secondary | ICD-10-CM | POA: Diagnosis not present

## 2019-02-17 DIAGNOSIS — I4891 Unspecified atrial fibrillation: Secondary | ICD-10-CM | POA: Diagnosis not present

## 2019-02-17 DIAGNOSIS — Z20828 Contact with and (suspected) exposure to other viral communicable diseases: Secondary | ICD-10-CM | POA: Diagnosis not present

## 2019-02-17 DIAGNOSIS — Z6835 Body mass index (BMI) 35.0-35.9, adult: Secondary | ICD-10-CM | POA: Diagnosis not present

## 2019-02-17 DIAGNOSIS — J189 Pneumonia, unspecified organism: Secondary | ICD-10-CM | POA: Diagnosis not present

## 2019-02-21 ENCOUNTER — Inpatient Hospital Stay (HOSPITAL_COMMUNITY)
Admission: EM | Admit: 2019-02-21 | Discharge: 2019-02-26 | DRG: 291 | Disposition: A | Discharge status: Hospice | Payer: Medicare HMO | Attending: Internal Medicine | Admitting: Internal Medicine

## 2019-02-21 ENCOUNTER — Other Ambulatory Visit: Payer: Self-pay

## 2019-02-21 ENCOUNTER — Emergency Department (HOSPITAL_COMMUNITY): Payer: Medicare HMO

## 2019-02-21 DIAGNOSIS — I361 Nonrheumatic tricuspid (valve) insufficiency: Secondary | ICD-10-CM | POA: Diagnosis not present

## 2019-02-21 DIAGNOSIS — E119 Type 2 diabetes mellitus without complications: Secondary | ICD-10-CM | POA: Diagnosis present

## 2019-02-21 DIAGNOSIS — N179 Acute kidney failure, unspecified: Secondary | ICD-10-CM | POA: Diagnosis not present

## 2019-02-21 DIAGNOSIS — R079 Chest pain, unspecified: Secondary | ICD-10-CM | POA: Diagnosis not present

## 2019-02-21 DIAGNOSIS — Z515 Encounter for palliative care: Secondary | ICD-10-CM | POA: Diagnosis not present

## 2019-02-21 DIAGNOSIS — R570 Cardiogenic shock: Secondary | ICD-10-CM | POA: Diagnosis not present

## 2019-02-21 DIAGNOSIS — I4821 Permanent atrial fibrillation: Secondary | ICD-10-CM | POA: Diagnosis present

## 2019-02-21 DIAGNOSIS — Z823 Family history of stroke: Secondary | ICD-10-CM

## 2019-02-21 DIAGNOSIS — J9811 Atelectasis: Secondary | ICD-10-CM | POA: Diagnosis not present

## 2019-02-21 DIAGNOSIS — Z7189 Other specified counseling: Secondary | ICD-10-CM | POA: Diagnosis not present

## 2019-02-21 DIAGNOSIS — I340 Nonrheumatic mitral (valve) insufficiency: Secondary | ICD-10-CM | POA: Diagnosis not present

## 2019-02-21 DIAGNOSIS — I5084 End stage heart failure: Secondary | ICD-10-CM | POA: Diagnosis present

## 2019-02-21 DIAGNOSIS — I110 Hypertensive heart disease with heart failure: Secondary | ICD-10-CM | POA: Diagnosis present

## 2019-02-21 DIAGNOSIS — Z66 Do not resuscitate: Secondary | ICD-10-CM | POA: Diagnosis not present

## 2019-02-21 DIAGNOSIS — W19XXXA Unspecified fall, initial encounter: Secondary | ICD-10-CM

## 2019-02-21 DIAGNOSIS — F329 Major depressive disorder, single episode, unspecified: Secondary | ICD-10-CM | POA: Diagnosis present

## 2019-02-21 DIAGNOSIS — R279 Unspecified lack of coordination: Secondary | ICD-10-CM | POA: Diagnosis not present

## 2019-02-21 DIAGNOSIS — G9340 Encephalopathy, unspecified: Secondary | ICD-10-CM | POA: Diagnosis not present

## 2019-02-21 DIAGNOSIS — E785 Hyperlipidemia, unspecified: Secondary | ICD-10-CM | POA: Diagnosis present

## 2019-02-21 DIAGNOSIS — E876 Hypokalemia: Secondary | ICD-10-CM | POA: Diagnosis present

## 2019-02-21 DIAGNOSIS — I69354 Hemiplegia and hemiparesis following cerebral infarction affecting left non-dominant side: Secondary | ICD-10-CM

## 2019-02-21 DIAGNOSIS — Z20828 Contact with and (suspected) exposure to other viral communicable diseases: Secondary | ICD-10-CM | POA: Diagnosis present

## 2019-02-21 DIAGNOSIS — J449 Chronic obstructive pulmonary disease, unspecified: Secondary | ICD-10-CM | POA: Diagnosis present

## 2019-02-21 DIAGNOSIS — I5043 Acute on chronic combined systolic (congestive) and diastolic (congestive) heart failure: Secondary | ICD-10-CM | POA: Diagnosis not present

## 2019-02-21 DIAGNOSIS — T502X5A Adverse effect of carbonic-anhydrase inhibitors, benzothiadiazides and other diuretics, initial encounter: Secondary | ICD-10-CM | POA: Diagnosis present

## 2019-02-21 DIAGNOSIS — Z452 Encounter for adjustment and management of vascular access device: Secondary | ICD-10-CM | POA: Diagnosis not present

## 2019-02-21 DIAGNOSIS — I428 Other cardiomyopathies: Secondary | ICD-10-CM | POA: Diagnosis present

## 2019-02-21 DIAGNOSIS — Z7901 Long term (current) use of anticoagulants: Secondary | ICD-10-CM

## 2019-02-21 DIAGNOSIS — E7800 Pure hypercholesterolemia, unspecified: Secondary | ICD-10-CM | POA: Diagnosis present

## 2019-02-21 DIAGNOSIS — Z789 Other specified health status: Secondary | ICD-10-CM

## 2019-02-21 DIAGNOSIS — Z743 Need for continuous supervision: Secondary | ICD-10-CM | POA: Diagnosis not present

## 2019-02-21 DIAGNOSIS — I5023 Acute on chronic systolic (congestive) heart failure: Secondary | ICD-10-CM | POA: Diagnosis present

## 2019-02-21 DIAGNOSIS — Z79899 Other long term (current) drug therapy: Secondary | ICD-10-CM | POA: Diagnosis not present

## 2019-02-21 DIAGNOSIS — I259 Chronic ischemic heart disease, unspecified: Secondary | ICD-10-CM | POA: Diagnosis present

## 2019-02-21 DIAGNOSIS — S0990XA Unspecified injury of head, initial encounter: Secondary | ICD-10-CM | POA: Diagnosis not present

## 2019-02-21 DIAGNOSIS — Z87891 Personal history of nicotine dependence: Secondary | ICD-10-CM

## 2019-02-21 DIAGNOSIS — I509 Heart failure, unspecified: Secondary | ICD-10-CM | POA: Diagnosis not present

## 2019-02-21 DIAGNOSIS — I5082 Biventricular heart failure: Secondary | ICD-10-CM | POA: Diagnosis present

## 2019-02-21 DIAGNOSIS — Z95828 Presence of other vascular implants and grafts: Secondary | ICD-10-CM

## 2019-02-21 DIAGNOSIS — R000 Tachycardia, unspecified: Secondary | ICD-10-CM | POA: Diagnosis not present

## 2019-02-21 DIAGNOSIS — I4891 Unspecified atrial fibrillation: Secondary | ICD-10-CM | POA: Diagnosis not present

## 2019-02-21 DIAGNOSIS — R0902 Hypoxemia: Secondary | ICD-10-CM | POA: Diagnosis not present

## 2019-02-21 DIAGNOSIS — Z87892 Personal history of anaphylaxis: Secondary | ICD-10-CM

## 2019-02-21 DIAGNOSIS — Z888 Allergy status to other drugs, medicaments and biological substances status: Secondary | ICD-10-CM

## 2019-02-21 DIAGNOSIS — R296 Repeated falls: Secondary | ICD-10-CM | POA: Diagnosis present

## 2019-02-21 DIAGNOSIS — R4182 Altered mental status, unspecified: Secondary | ICD-10-CM | POA: Diagnosis not present

## 2019-02-21 DIAGNOSIS — K219 Gastro-esophageal reflux disease without esophagitis: Secondary | ICD-10-CM | POA: Diagnosis present

## 2019-02-21 DIAGNOSIS — Z8249 Family history of ischemic heart disease and other diseases of the circulatory system: Secondary | ICD-10-CM

## 2019-02-21 DIAGNOSIS — I214 Non-ST elevation (NSTEMI) myocardial infarction: Secondary | ICD-10-CM | POA: Diagnosis not present

## 2019-02-21 LAB — BASIC METABOLIC PANEL
Anion gap: 10 (ref 5–15)
BUN: 29 mg/dL — ABNORMAL HIGH (ref 8–23)
CO2: 30 mmol/L (ref 22–32)
Calcium: 8.3 mg/dL — ABNORMAL LOW (ref 8.9–10.3)
Chloride: 99 mmol/L (ref 98–111)
Creatinine, Ser: 1.2 mg/dL (ref 0.61–1.24)
GFR calc Af Amer: >60 mL/min (ref >60)
GFR calc non Af Amer: 56 mL/min — ABNORMAL LOW (ref >60)
Glucose, Bld: 163 mg/dL — ABNORMAL HIGH (ref 70–99)
Potassium: 3.1 mmol/L — ABNORMAL LOW (ref 3.5–5.1)
Sodium: 139 mmol/L (ref 135–145)

## 2019-02-21 LAB — PROTIME-INR
INR: 2.1 — ABNORMAL HIGH (ref 0.8–1.2)
Prothrombin Time: 23.1 seconds — ABNORMAL HIGH (ref 11.4–15.2)

## 2019-02-21 LAB — HEPATIC FUNCTION PANEL
ALT: 25 U/L (ref 0–44)
AST: 36 U/L (ref 15–41)
Albumin: 3.2 g/dL — ABNORMAL LOW (ref 3.5–5.0)
Alkaline Phosphatase: 88 U/L (ref 38–126)
Bilirubin, Direct: 0.4 mg/dL — ABNORMAL HIGH (ref 0.0–0.2)
Indirect Bilirubin: 0.6 mg/dL (ref 0.3–0.9)
Total Bilirubin: 1 mg/dL (ref 0.3–1.2)
Total Protein: 7.4 g/dL (ref 6.5–8.1)

## 2019-02-21 LAB — CBC
HCT: 43.5 % (ref 39.0–52.0)
Hemoglobin: 13.3 g/dL (ref 13.0–17.0)
MCH: 29.1 pg (ref 26.0–34.0)
MCHC: 30.6 g/dL (ref 30.0–36.0)
MCV: 95.2 fL (ref 80.0–100.0)
Platelets: 221 10*3/uL (ref 150–400)
RBC: 4.57 MIL/uL (ref 4.22–5.81)
RDW: 14.9 % (ref 11.5–15.5)
WBC: 6.5 10*3/uL (ref 4.0–10.5)
nRBC: 0 % (ref 0.0–0.2)

## 2019-02-21 LAB — BRAIN NATRIURETIC PEPTIDE: B Natriuretic Peptide: 1554.5 pg/mL — ABNORMAL HIGH (ref 0.0–100.0)

## 2019-02-21 LAB — MAGNESIUM: Magnesium: 1.9 mg/dL (ref 1.7–2.4)

## 2019-02-21 LAB — TROPONIN I (HIGH SENSITIVITY): Troponin I (High Sensitivity): 28 ng/L — ABNORMAL HIGH (ref <18)

## 2019-02-21 MED ORDER — DILTIAZEM HCL-DEXTROSE 125-5 MG/125ML-% IV SOLN (PREMIX)
5.0000 mg/h | INTRAVENOUS | Status: DC
  Administered 2019-02-21 – 2019-02-22 (×2): 5 mg/h via INTRAVENOUS
  Administered 2019-02-22 – 2019-02-23 (×3): 15 mg/h via INTRAVENOUS
  Filled 2019-02-21 (×4): qty 125

## 2019-02-21 MED ORDER — POTASSIUM CHLORIDE 10 MEQ/100ML IV SOLN
10.0000 meq | INTRAVENOUS | Status: AC
  Administered 2019-02-21 – 2019-02-22 (×2): 10 meq via INTRAVENOUS
  Filled 2019-02-21 (×2): qty 100

## 2019-02-21 MED ORDER — DILTIAZEM HCL 25 MG/5ML IV SOLN
15.0000 mg | Freq: Once | INTRAVENOUS | Status: AC
  Administered 2019-02-21: 15 mg via INTRAVENOUS
  Filled 2019-02-21: qty 5

## 2019-02-21 MED ORDER — POTASSIUM CHLORIDE CRYS ER 20 MEQ PO TBCR
40.0000 meq | EXTENDED_RELEASE_TABLET | Freq: Once | ORAL | Status: AC
  Administered 2019-02-21: 40 meq via ORAL
  Filled 2019-02-21: qty 2

## 2019-02-21 MED ORDER — FUROSEMIDE 10 MG/ML IJ SOLN
120.0000 mg | Freq: Once | INTRAVENOUS | Status: AC
  Administered 2019-02-21: 120 mg via INTRAVENOUS
  Filled 2019-02-21: qty 10

## 2019-02-21 MED ORDER — SODIUM CHLORIDE 0.9% FLUSH
3.0000 mL | Freq: Once | INTRAVENOUS | Status: AC
  Administered 2019-02-21: 3 mL via INTRAVENOUS

## 2019-02-21 NOTE — ED Triage Notes (Addendum)
Patient tripped and fell in the bathroom and hit his head (mild bleeding/abrasion). Currently takes Eloquis. Denies LOC.

## 2019-02-21 NOTE — ED Notes (Signed)
Pt to xray

## 2019-02-21 NOTE — ED Provider Notes (Signed)
[facility] EMERGENCY DEPARTMENT Provider Note   CSN: [id number] Arrival date & time: 02/21/19  1946     History   Chief Complaint Chief Complaint  Patient presents with   Fall   Afib/RVR    HPI Chad Franklin is a 84 y.o. male.     84 year old male with extensive past medical history below including CVA, atrial fibrillation on Eliquis, sCHF, type 2 diabetes mellitus, COPD who presents with fall and shortness of breath.  Daughter states that earlier today, the patient had a witnessed mechanical fall during which his wife reports he fell backwards and struck the back of his head.  It is unclear whether he lost consciousness.  Daughter reports that lately he has had significant weakness and difficulty ambulating with several falls.  He reports that he has had worsening shortness of breath over the past week as well as orthopnea, weight gain, and increased lower extremity edema.  Daughter notes that they increased his Lasix to 160 mg daily this week which he has been taking.  He is compliant with all of his other medications.  He reports that he had an episode of chest pain last week that he is not able to characterize but states that it resolved and has not returned.  He denies any heart racing sensation.  Daughter notes that he has had a mild cough and was tested for COVID-19 earlier this week which was negative.  He has had no fevers or vomiting.  Of note, he had a fall last week in his yard during which he struck L hand on a brick.  The history is provided by the patient.  Fall    Past Medical History:  Diagnosis Date   Bilateral swelling of feet    COPD (chronic obstructive pulmonary disease) (HCC)    Depression    Diabetes mellitus    GERD (gastroesophageal reflux disease)    Palpitations    Stroke [facility])    2    Patient Active Problem List   Diagnosis Date Noted   Pure hypercholesterolemia 11/11/2018   Orthostasis 10/17/2018   Abnormality  of gait 08/15/2018   Other fatigue 06/30/2018   Hemiparesis affecting left side as late effect of stroke (HCC) 05/23/2018   History of cardioembolic cerebrovascular accident (CVA) 12/19/2017   Hyperlipidemia 12/19/2017   Permanent atrial fibrillation (HCC) 12/19/2017   Chronic systolic heart failure (HCC) 12/19/2017   Diabetes mellitus type 2 in obese (HCC)    Morbid obesity (HCC)    Benign essential HTN    Hypoalbuminemia due to protein-calorie malnutrition (HCC)    Sleep disturbance    Ischemic stroke of frontal lobe (HCC) 11/20/2017   History of depression    Essential hypertension    Chronic atrial fibrillation (HCC)    Acute combined systolic and diastolic congestive heart failure (HCC)    Diabetes mellitus type 2 in nonobese [facility])    Atrial fibrillation with rapid ventricular response (HCC)    Hypokalemia    Acute blood loss anemia    Diabetes mellitus 10/26/2010   GERD (gastroesophageal reflux disease) 10/26/2010   COPD (chronic obstructive pulmonary disease) (HCC) 10/26/2010   Depression 10/26/2010   Bilateral swelling of feet 10/26/2010   Palpitations 10/26/2010    Past Surgical History:  Procedure Laterality Date   GALLBLADDER SURGERY     HERNIA REPAIR          Home Medications    Prior to Admission medications   Medication Sig Start Date  End Date Taking? Authorizing Provider  acetaminophen (TYLENOL) 500 MG tablet Take 1 tablet (500 mg total) by mouth every 8 (eight) hours as needed. Patient taking differently: Take 500 mg by mouth 2 (two) times daily.  12/04/17  Yes Love, Evlyn KannerPamela S, PA-C  calcium carbonate (TUMS - DOSED IN MG ELEMENTAL CALCIUM) 500 MG chewable tablet Chew 1 tablet by mouth 3 (three) times daily as needed for indigestion or heartburn.   Yes [provider]  ELIQUIS 5 MG TABS tablet TAKE 1 TABLET BY MOUTH TWICE DAILY Patient taking differently: Take 5 mg by mouth 2 (two) times daily.  07/25/18  Yes Jodelle Redhristopher,  Bridgette, MD  furosemide (LASIX) 80 MG tablet Take 1 tablet (80 mg total) by mouth daily. 03/25/18  Yes Jodelle Redhristopher, Bridgette, MD  loratadine (CLARITIN) 10 MG tablet Take 10 mg by mouth daily as needed for allergies.   Yes [provider]  metoprolol succinate (TOPROL-XL) 50 MG 24 hr tablet Take 1 tablet (50 mg total) by mouth daily. Take with or immediately following a meal. Patient taking differently: Take 25 mg by mouth daily. Take with or immediately following a meal. 12/05/18 11/30/19 Yes Jodelle Redhristopher, Bridgette, MD  PARoxetine (PAXIL) 20 MG tablet Take 1 tablet (20 mg total) by mouth daily. Patient taking differently: Take 40 mg by mouth every morning.  12/04/17  Yes Love, Evlyn KannerPamela S, PA-C  potassium chloride SA (K-DUR,KLOR-CON) 20 MEQ tablet Take 1 tablet (20 mEq total) by mouth daily. 03/25/18  Yes Jodelle Redhristopher, Bridgette, MD  pravastatin (PRAVACHOL) 10 MG tablet Take 1 tablet (10 mg total) by mouth daily. 03/25/18  Yes Jodelle Redhristopher, Bridgette, MD  methylphenidate (RITALIN) 10 MG tablet Take 0.5 tablets (5 mg total) by mouth 2 (two) times daily with breakfast and lunch. Patient not taking: Reported on 02/21/2019 01/16/19   Marcello FennelPatel, Ankit Anil, MD    Family History Family History  Problem Relation Age of Onset   Hypertension Father    Stroke Sister     Social History Social History   Tobacco Use   Smoking status: Former Smoker   Smokeless tobacco: Never Used  Substance Use Topics   Alcohol use: No    Comment: past drinker   Drug use: Not Currently     Allergies   Naproxen sodium   Review of Systems Review of Systems All other systems reviewed and are negative except that which was mentioned in HPI   Physical Exam Updated Vital Signs BP 111/74    Pulse (!) 109    Temp 98.3 F (36.8 C) (Oral)    Resp 18    Ht 5\' 6"  (1.676 m)    Wt 99.8 kg    SpO2 93%    BMI 35.51 kg/m   Physical Exam Vitals signs and nursing note reviewed.  Constitutional:      General: He is  not in acute distress.    Appearance: He is well-developed.     Comments: Frail, chronically ill-appearing, dyspneic  HENT:     Head: Normocephalic and atraumatic.  Eyes:     Conjunctiva/sclera: Conjunctivae normal.     Pupils: Pupils are equal, round, and reactive to light.  Neck:     Musculoskeletal: Neck supple.  Cardiovascular:     Rate and Rhythm: Tachycardia present. Rhythm irregularly irregular.     Heart sounds: Normal heart sounds.  Pulmonary:     Breath sounds: No wheezing.     Comments: Dyspneic with increased work of breathing, no respiratory distress, diminished breath sounds bilaterally  Abdominal:     General: Bowel sounds are normal. There is no distension.     Palpations: Abdomen is soft.     Tenderness: There is no abdominal tenderness.  Musculoskeletal:     Right lower leg: Edema present.     Left lower leg: Edema present.     Comments: 3+ pitting edema BLE  Skin:    General: Skin is warm and dry.     Comments: Healing wound on L palm, thenar eminence  Neurological:     Mental Status: He is alert and oriented to person, place, and time.     Comments: Fluent speech      ED Treatments / Results  Labs (all labs ordered are listed, but only abnormal results are displayed) Labs Reviewed  BASIC METABOLIC PANEL - Abnormal; Notable for the following components:      Result Value   Potassium 3.1 (*)    Glucose, Bld 163 (*)    BUN 29 (*)    Calcium 8.3 (*)    GFR calc non Af Amer 56 (*)    All other components within normal limits  PROTIME-INR - Abnormal; Notable for the following components:   Prothrombin Time 23.1 (*)    INR 2.1 (*)    All other components within normal limits  HEPATIC FUNCTION PANEL - Abnormal; Notable for the following components:   Albumin 3.2 (*)    Bilirubin, Direct 0.4 (*)    All other components within normal limits  BRAIN NATRIURETIC PEPTIDE - Abnormal; Notable for the following components:   B Natriuretic Peptide 1,554.5 (*)      All other components within normal limits  TROPONIN I (HIGH SENSITIVITY) - Abnormal; Notable for the following components:   Troponin I (High Sensitivity) 28 (*)    All other components within normal limits  SARS CORONAVIRUS 2 (TAT 6-24 HRS)  CBC  MAGNESIUM  TROPONIN I (HIGH SENSITIVITY)    EKG EKG Interpretation  Date/Time:  Saturday February 21 2019 19:54:22 EST Ventricular Rate:  142 PR Interval:    QRS Duration: 98 QT Interval:  348 QTC Calculation: 535 R Axis:   5 Text Interpretation: Atrial fibrillation with rapid ventricular response Nonspecific T wave abnormality Abnormal ECG similar to previous Confirmed by Frederick Peers [phone]) on 02/21/2019 8:16:51 PM   Radiology Dg Chest 2 View  Result Date: 02/21/2019 CLINICAL DATA:  Tripped and fell in bathroom, struck head, chest pain, diabetes mellitus, COPD, GERD, hypertension EXAM: CHEST - 2 VIEW COMPARISON:  11/19/2017 FINDINGS: Enlargement of cardiac silhouette. Mediastinal contours and pulmonary vascularity normal. Minimal chronic interstitial prominence and chronic bronchitic changes, stable. No acute infiltrate, pleural effusion, or pneumothorax. Diffuse osseous demineralization. Atherosclerotic calcification aorta. IMPRESSION: Enlargement of cardiac silhouette. Minimal chronic bronchitic and interstitial changes. No acute abnormalities. Electronically Signed   By: Ulyses Southward M.D.   On: 02/21/2019 20:25   Ct Head Wo Contrast  Result Date: 02/21/2019 CLINICAL DATA:  Head trauma after a fall EXAM: CT HEAD WITHOUT CONTRAST TECHNIQUE: Contiguous axial images were obtained from the base of the skull through the vertex without intravenous contrast. COMPARISON:  November 15, 2017 FINDINGS: Brain: No evidence of acute territorial infarction, hemorrhage, hydrocephalus,extra-axial collection or mass lesion/mass effect. There is dilatation the ventricles and sulci consistent with age-related atrophy. Low-attenuation changes in the deep  white matter consistent with small vessel ischemia. Areas of encephalomalacia involving the left parietal lobe. There is lacunar infarct involving the right internal capsule and right corona radiata. Vascular: No  hyperdense vessel or unexpected calcification. Skull: The skull is intact. No fracture or focal lesion identified. Sinuses/Orbits: The visualized paranasal sinuses and mastoid air cells are clear. The orbits and globes intact. Other: None IMPRESSION: No acute intracranial abnormality. Findings consistent with age related atrophy and chronic small vessel ischemia Encephalomalacia in the left parietal lobe, lacunar infarcts in the right internal capsule and corona radiata. Electronically Signed   By: Prudencio Pair M.D.   On: 02/21/2019 21:31    Procedures .Critical Care Performed by: Sharlett Iles, MD Authorized by: Sharlett Iles, MD   Critical care provider statement:    Critical care time (minutes):  30   Critical care time was exclusive of:  Separately billable procedures and treating other patients   Critical care was necessary to treat or prevent imminent or life-threatening deterioration of the following conditions:  Cardiac failure   Critical care was time spent personally by me on the following activities:  Development of treatment plan with patient or surrogate, evaluation of patient's response to treatment, examination of patient, obtaining history from patient or surrogate, ordering and performing treatments and interventions, ordering and review of laboratory studies, ordering and review of radiographic studies and re-evaluation of patient's condition   (including critical care time)  Medications Ordered in ED Medications  potassium chloride 10 mEq in 100 mL IVPB (10 mEq Intravenous New Bag/Given 02/21/19 2300)  diltiazem (CARDIZEM) 125 mg in dextrose 5% 125 mL (1 mg/mL) infusion (has no administration in time range)  sodium chloride flush (NS) 0.9 % injection 3  mL (3 mLs Intravenous Given 02/21/19 2112)  diltiazem (CARDIZEM) injection 15 mg (15 mg Intravenous Given 02/21/19 2110)  furosemide (LASIX) 120 mg in dextrose 5 % 50 mL IVPB (0 mg Intravenous Stopped 02/21/19 2248)  potassium chloride SA (KLOR-CON) CR tablet 40 mEq (40 mEq Oral Given 02/21/19 2127)     Initial Impression / Assessment and Plan / ED Course  I have reviewed the triage vital signs and the nursing notes.  Pertinent labs & imaging results that were available during my care of the patient were reviewed by me and considered in my medical decision making (see chart for details).        Patient was dyspneic and chronically ill-appearing on exam but no acute respiratory distress.  He appeared volume overloaded with significant lower extremity edema, orthopnea, and significant shortness of breath with minimal movement in the room.  Saturations mid 90s on room air when still.  Heart rate variable in the 130s to 140s, EKG shows atrial fibrillation with RVR.  Lab work today shows potassium 3.1, creatinine 1.2, reassuring CBC, troponin 28. BNP 1555.  I suspect he has been in A. fib with RVR for a while and has not noticed the tachycardia, which may be resulting in worsening heart failure and demand ischemia.  Chest x-ray with cardiomegaly and minimal interstitial changes.  Given the severity of his dyspnea and interference with ADLs, recommended admission for diuresis and rate control.  Gave IV Lasix, IV and oral potassium, and diltiazem bolus followed by drip. Discussed admission w/ Internal med teaching service who will admit for further care.  Final Clinical Impressions(s) / ED Diagnoses   Final diagnoses:  Atrial fibrillation with RVR ([place])  Acute on chronic congestive heart failure, unspecified heart failure type [facility])  Fall, initial encounter    ED Discharge Orders    None       Flavia Bruss, Wenda Overland, MD 02/21/19 2337

## 2019-02-21 NOTE — ED Notes (Signed)
Pt to ct 

## 2019-02-21 NOTE — H&P (Addendum)
Date: 02/22/2019               Patient Name:  Chad Franklin MRN: [id number]  DOB: 05-Feb-1935 Age / Sex: 84 y.o., male   PCP: Cyndi Bender, PA-C         Medical Service: Internal Medicine Teaching Service         Attending Physician: Dr. Bartholomew Crews, MD    First Contact: Dr. Marianna Payment Pager: [phone]  Second Contact: Dr. Truman Hayward Pager: [phone]       After Hours (After 5p/  First Contact Pager: [phone]  weekends / holidays): Second Contact Pager: [phone]   Chief Complaint: Fall  History of Present Illness:  Chad Franklin is a 84yo male with PMH embolic CVA with residual left sided weakness, afib on eliquis, HFrEF (EF 25-30%), COPD, and HTN who presented after a fall at home along with worsening shortness of breath over the past month. His fall was witnessed by his wife when he fell backward and hit his head. He did not lose consciousness.  He is accompanied by his daughter who provided some of the history. She states he has had several falls recently and has had worsening weakness over the past month in addition to increased lower extremity swelling and weight gain.  He states he has had increased shortness of breath, especially with walking. He endorses increased orthopnea. Per daughter bseline weight is around 220 lbs and he has increased to about 240lbs. He saw his PCP earlier this week on 12/1 who increased his home dose of lasix from 80 mg qd to 160 mg qd. He had a mild cough at the time and was tested for covid, which was negative. He and his daughter state the increased lasix dose has not helped much.  He denies chest pain but endorses palpitations and sensation of racing heart for the past 3-4 weeks. He has had no issue with urination, he denies cough.    Last cardiology visit via telehealth in August, HR was 107, thought to be secondary to starting ritalin, and his toprol was increased to 50 mg qd. He is no longer taking ritalin. It was also decided at that time not to pursue  an ICD with patient and daughter.   ED Course: HR initially 150s with afib with RVR on EKG. He was given dilt bolus with improvement to 110s, then started on dilt gtt. Given lasix 120 mg IV.   Social:   He does not drink alcohol. Quit smoking >30 years ago He lives in [place] with his wife and is able to get around on his own and perform ADLs.    Family History:  Parents: both had hypertension  Father: heart problems   Meds:  Current Meds  Medication Sig  . acetaminophen (TYLENOL) 500 MG tablet Take 1 tablet (500 mg total) by mouth every 8 (eight) hours as needed. (Patient taking differently: Take 500 mg by mouth 2 (two) times daily. )  . calcium carbonate (TUMS - DOSED IN MG ELEMENTAL CALCIUM) 500 MG chewable tablet Chew 1 tablet by mouth 3 (three) times daily as needed for indigestion or heartburn.  Marland Kitchen ELIQUIS 5 MG TABS tablet TAKE 1 TABLET BY MOUTH TWICE DAILY (Patient taking differently: Take 5 mg by mouth 2 (two) times daily. )  . furosemide (LASIX) 80 MG tablet Take 1 tablet (80 mg total) by mouth daily.  Marland Kitchen loratadine (CLARITIN) 10 MG tablet Take 10 mg by mouth daily as needed for allergies.  Marland Kitchen  metoprolol succinate (TOPROL-XL) 50 MG 24 hr tablet Take 1 tablet (50 mg total) by mouth daily. Take with or immediately following a meal. (Patient taking differently: Take 25 mg by mouth daily. Take with or immediately following a meal.)  . PARoxetine (PAXIL) 20 MG tablet Take 1 tablet (20 mg total) by mouth daily. (Patient taking differently: Take 40 mg by mouth every morning. )  . potassium chloride SA (K-DUR,KLOR-CON) 20 MEQ tablet Take 1 tablet (20 mEq total) by mouth daily.  . pravastatin (PRAVACHOL) 10 MG tablet Take 1 tablet (10 mg total) by mouth daily.     Allergies: Allergies as of 02/21/2019 - Review Complete 02/21/2019  Allergen Reaction Noted  . Naproxen sodium Anaphylaxis, Hives, Shortness Of Breath, and Swelling 10/26/2010   Past Medical History:  Diagnosis Date  .  Bilateral swelling of feet   . COPD (chronic obstructive pulmonary disease) (HCC)   . Depression   . Diabetes mellitus   . GERD (gastroesophageal reflux disease)   . Palpitations   . Stroke [facility])    2     Review of Systems: A complete ROS was negative except as per HPI.   Physical Exam: Blood pressure (!) 138/108, pulse (!) 103, temperature 98.3 F (36.8 C), temperature source Oral, resp. rate (!) 22, height 5\' 6"  (1.676 m), weight 99.8 kg, SpO2 96 %.  Constitution: NAD, sitting up in bed, on 3L [place] HENT: 2cm hematoma central occipital region, TTP, otherwise AT/[place] Cardio: irregular rate and rhythm, no m/r/g; +2 LE edema to knees, no JVD Respiratory: decreased breath sounds bilateral bases, otherwise CTA without wheezing rales rhonchi Abdominal: soft, nondistended, NTTP, +BS MSK: strength 3/5 left  Neuro: alert & oriented, CN II-XII grossly intact Skin: hematoma as noted above, otherwise c/d/i    EKG: personally reviewed my interpretation is atrial fibrillation with RVR  CXR: personally reviewed my interpretation is vascular congestion that appears chronic, cardiomegaly   Echo 03/25/18 - Left ventricle: The cavity size was normal. Wall thickness was increased in a pattern of mild LVH. Systolic function was severely reduced. The estimated ejection fraction was in the range of 25% to 30%. Diffuse hypokinesis. The study is not technically sufficient to allow evaluation of LV diastolic function. - Aortic valve: There was trivial regurgitation. - Mitral valve: There was moderate regurgitation. - Right ventricle: Systolic function was mildly reduced. - Tricuspid valve: There was mild regurgitation. - Pulmonary arteries: Systolic pressure was mildly increased. PA peak pressure: 38 mm Hg (S).  Impressions: - Compared to the prior study, there has been no significant interval change.   Myoview stress: 11/17/17 FINDINGS: Perfusion: Decreased counts within  the apical, mid and basilar segment of the inferior wall which are fixed on rest and stress. Decreased counts in the apical segment of the inferolateral wall which are fixed on rest and stress. No evidence reversible ischemia.  Wall Motion: LEFT ventricle is dilated at rest and stress. Global hypokinesia.  Left Ventricular Ejection Fraction: 28 % End diastolic volume 183 ml End systolic volume 131 ml  IMPRESSION: 1. No reversible ischemia. Fixed defect in the inferolateral wall may represent infarction. Decrease counts in the inferior wall are favored diaphragmatic attenuation.  2. Global hypokinesia. LEFT ventricular dilatation at rest and stress.  3. Left ventricular ejection fraction 28%  4. Non invasive risk stratification*: High (based on low ejection Fraction).  Assessment & Plan by Problem: Active Problems:   Atrial fibrillation with RVR (HCC)   Acute exacerbation of CHF (congestive heart failure) (  HCC)  Chad Franklin is a 84yo male with PMH embolic CVA with residual left sided weakness, afib on eliquis, HFrEF (EF 25-30%), COPD, and HTN who presented after a fall at home and worsening orthpnea, swelling, and shortness of breath over the past month.    Atrial Fibrillation with RVR HR 150s at admission. Given bolus of diltiazem in the ED, started on dilt gtt as RVR did not resolve, now currently in the ~110s. History of atrial fibrillation that was not controlled as recently as August during telehealth visit with cardiology, also history of developing afib with other HF exacerbations. At that time his toprol xl was increased to 50 mg qd. Troponin 28, likely in the setting of demand ischemia. CHA2DS2VASC 7.  - continue dilt gtt  - cont. eliquis  - cont. Home toprol xl 50 mg   - trend troponin - TSH - consult cardiology in the am   Acute Systolic Heart Failure Exacerbation Home dose 80mg  po recently increased to 160mg  qd by PCP in the setting of worsening HF  symptoms over the past month. ECHO 03/2018 showed EF 25-30% with diffuse hypokinesis. Given 120cc lasix in the ED with good urine output. BNP 1500.  EDW 220 lbs per patient, consistent with cards note (215 in August), now 240 lbs at home.   - Repeat echo  - daily weights, strict I/o's  - lasix 120 mg qd- follow-up admission weight - replete K  - am labs  - start spironolactone pending echo   Fall Mechanical fall at home. CT without hemorrhage or acute findings. Continuing home eliquis. Trend CBC.  - PT/OT  Hypokalemia Chronic 2/2 diuretics.  - 40 mEq now and again tomorrow.   COPD History of COPD per chart and patient. No PFTs on file or care everywhere. No home inhalers listed. No signs or symptoms of COPD exacerbation.   Depression Takes paxil at home. Continue 20 mg qd.    Diet: heart healthy  VTE: eliquis  IVF: none  Code: partial - does not want intubation but would want full scope of care otherwise   Dispo: Admit patient to Inpatient with expected length of stay greater than 2 midnights.  Signed2/2020, DO 02/22/2019, 12:54 AM  Pager: [phone]

## 2019-02-22 ENCOUNTER — Encounter (HOSPITAL_COMMUNITY): Payer: Self-pay | Admitting: *Deleted

## 2019-02-22 ENCOUNTER — Inpatient Hospital Stay (HOSPITAL_COMMUNITY): Payer: Medicare HMO

## 2019-02-22 DIAGNOSIS — J449 Chronic obstructive pulmonary disease, unspecified: Secondary | ICD-10-CM | POA: Diagnosis present

## 2019-02-22 DIAGNOSIS — I4891 Unspecified atrial fibrillation: Secondary | ICD-10-CM | POA: Diagnosis present

## 2019-02-22 DIAGNOSIS — N179 Acute kidney failure, unspecified: Secondary | ICD-10-CM | POA: Diagnosis not present

## 2019-02-22 DIAGNOSIS — I5023 Acute on chronic systolic (congestive) heart failure: Secondary | ICD-10-CM | POA: Diagnosis present

## 2019-02-22 DIAGNOSIS — R570 Cardiogenic shock: Secondary | ICD-10-CM | POA: Diagnosis not present

## 2019-02-22 DIAGNOSIS — G9340 Encephalopathy, unspecified: Secondary | ICD-10-CM | POA: Diagnosis not present

## 2019-02-22 DIAGNOSIS — T502X5A Adverse effect of carbonic-anhydrase inhibitors, benzothiadiazides and other diuretics, initial encounter: Secondary | ICD-10-CM | POA: Diagnosis present

## 2019-02-22 DIAGNOSIS — Z79899 Other long term (current) drug therapy: Secondary | ICD-10-CM | POA: Diagnosis not present

## 2019-02-22 DIAGNOSIS — Z515 Encounter for palliative care: Secondary | ICD-10-CM | POA: Diagnosis not present

## 2019-02-22 DIAGNOSIS — I110 Hypertensive heart disease with heart failure: Secondary | ICD-10-CM | POA: Diagnosis present

## 2019-02-22 DIAGNOSIS — I69354 Hemiplegia and hemiparesis following cerebral infarction affecting left non-dominant side: Secondary | ICD-10-CM | POA: Diagnosis not present

## 2019-02-22 DIAGNOSIS — I361 Nonrheumatic tricuspid (valve) insufficiency: Secondary | ICD-10-CM

## 2019-02-22 DIAGNOSIS — F329 Major depressive disorder, single episode, unspecified: Secondary | ICD-10-CM | POA: Diagnosis present

## 2019-02-22 DIAGNOSIS — I259 Chronic ischemic heart disease, unspecified: Secondary | ICD-10-CM | POA: Diagnosis present

## 2019-02-22 DIAGNOSIS — Z7189 Other specified counseling: Secondary | ICD-10-CM | POA: Diagnosis not present

## 2019-02-22 DIAGNOSIS — I5082 Biventricular heart failure: Secondary | ICD-10-CM | POA: Diagnosis present

## 2019-02-22 DIAGNOSIS — I340 Nonrheumatic mitral (valve) insufficiency: Secondary | ICD-10-CM

## 2019-02-22 DIAGNOSIS — W19XXXA Unspecified fall, initial encounter: Secondary | ICD-10-CM | POA: Diagnosis not present

## 2019-02-22 DIAGNOSIS — E7800 Pure hypercholesterolemia, unspecified: Secondary | ICD-10-CM | POA: Diagnosis present

## 2019-02-22 DIAGNOSIS — I509 Heart failure, unspecified: Secondary | ICD-10-CM

## 2019-02-22 DIAGNOSIS — I5084 End stage heart failure: Secondary | ICD-10-CM | POA: Diagnosis present

## 2019-02-22 DIAGNOSIS — Z66 Do not resuscitate: Secondary | ICD-10-CM | POA: Diagnosis not present

## 2019-02-22 DIAGNOSIS — Z20828 Contact with and (suspected) exposure to other viral communicable diseases: Secondary | ICD-10-CM | POA: Diagnosis present

## 2019-02-22 DIAGNOSIS — I4821 Permanent atrial fibrillation: Secondary | ICD-10-CM | POA: Diagnosis present

## 2019-02-22 DIAGNOSIS — I5043 Acute on chronic combined systolic (congestive) and diastolic (congestive) heart failure: Secondary | ICD-10-CM

## 2019-02-22 DIAGNOSIS — E119 Type 2 diabetes mellitus without complications: Secondary | ICD-10-CM | POA: Diagnosis present

## 2019-02-22 DIAGNOSIS — I428 Other cardiomyopathies: Secondary | ICD-10-CM | POA: Diagnosis present

## 2019-02-22 DIAGNOSIS — K219 Gastro-esophageal reflux disease without esophagitis: Secondary | ICD-10-CM | POA: Diagnosis present

## 2019-02-22 DIAGNOSIS — E785 Hyperlipidemia, unspecified: Secondary | ICD-10-CM | POA: Diagnosis present

## 2019-02-22 DIAGNOSIS — E876 Hypokalemia: Secondary | ICD-10-CM | POA: Diagnosis present

## 2019-02-22 DIAGNOSIS — R296 Repeated falls: Secondary | ICD-10-CM | POA: Diagnosis present

## 2019-02-22 DIAGNOSIS — I214 Non-ST elevation (NSTEMI) myocardial infarction: Secondary | ICD-10-CM | POA: Diagnosis not present

## 2019-02-22 LAB — COMPREHENSIVE METABOLIC PANEL
ALT: 20 U/L (ref 0–44)
AST: 54 U/L — ABNORMAL HIGH (ref 15–41)
Albumin: 3 g/dL — ABNORMAL LOW (ref 3.5–5.0)
Alkaline Phosphatase: 70 U/L (ref 38–126)
Anion gap: 10 (ref 5–15)
BUN: 25 mg/dL — ABNORMAL HIGH (ref 8–23)
CO2: 28 mmol/L (ref 22–32)
Calcium: 8 mg/dL — ABNORMAL LOW (ref 8.9–10.3)
Chloride: 102 mmol/L (ref 98–111)
Creatinine, Ser: 1.13 mg/dL (ref 0.61–1.24)
GFR calc Af Amer: >60 mL/min (ref >60)
GFR calc non Af Amer: >60 mL/min (ref >60)
Glucose, Bld: 126 mg/dL — ABNORMAL HIGH (ref 70–99)
Potassium: 4.7 mmol/L (ref 3.5–5.1)
Sodium: 140 mmol/L (ref 135–145)
Total Bilirubin: 1.3 mg/dL — ABNORMAL HIGH (ref 0.3–1.2)
Total Protein: 6.9 g/dL (ref 6.5–8.1)

## 2019-02-22 LAB — MAGNESIUM: Magnesium: 1.9 mg/dL (ref 1.7–2.4)

## 2019-02-22 LAB — CBC
HCT: 41.1 % (ref 39.0–52.0)
Hemoglobin: 12.6 g/dL — ABNORMAL LOW (ref 13.0–17.0)
MCH: 29 pg (ref 26.0–34.0)
MCHC: 30.7 g/dL (ref 30.0–36.0)
MCV: 94.5 fL (ref 80.0–100.0)
Platelets: 220 10*3/uL (ref 150–400)
RBC: 4.35 MIL/uL (ref 4.22–5.81)
RDW: 14.8 % (ref 11.5–15.5)
WBC: 9.1 10*3/uL (ref 4.0–10.5)
nRBC: 0 % (ref 0.0–0.2)

## 2019-02-22 LAB — TROPONIN I (HIGH SENSITIVITY)
Troponin I (High Sensitivity): 47 ng/L — ABNORMAL HIGH (ref <18)
Troponin I (High Sensitivity): 93 ng/L — ABNORMAL HIGH (ref <18)

## 2019-02-22 LAB — ECHOCARDIOGRAM COMPLETE
Height: 68 in
Weight: 3328 oz

## 2019-02-22 LAB — SARS CORONAVIRUS 2 (TAT 6-24 HRS): SARS Coronavirus 2: NEGATIVE

## 2019-02-22 LAB — TSH: TSH: 2.136 u[IU]/mL (ref 0.350–4.500)

## 2019-02-22 MED ORDER — FUROSEMIDE 10 MG/ML IJ SOLN
80.0000 mg | Freq: Once | INTRAMUSCULAR | Status: AC
  Administered 2019-02-22: 80 mg via INTRAVENOUS
  Filled 2019-02-22: qty 8

## 2019-02-22 MED ORDER — ALUM & MAG HYDROXIDE-SIMETH 200-200-20 MG/5ML PO SUSP: 30.0000 mL | Freq: Once | ORAL | Status: DC

## 2019-02-22 MED ORDER — ACETAMINOPHEN 650 MG RE SUPP: 650.0000 mg | Freq: Four times a day (QID) | RECTAL | Status: DC | PRN

## 2019-02-22 MED ORDER — LOSARTAN POTASSIUM 25 MG PO TABS
25.0000 mg | ORAL_TABLET | Freq: Every day | ORAL | Status: DC
  Administered 2019-02-22 – 2019-02-23 (×2): 25 mg via ORAL
  Filled 2019-02-22 (×2): qty 1

## 2019-02-22 MED ORDER — FUROSEMIDE 10 MG/ML IJ SOLN: 120.0000 mg | Freq: Two times a day (BID) | INTRAVENOUS | Status: DC

## 2019-02-22 MED ORDER — SPIRONOLACTONE 12.5 MG HALF TABLET
12.5000 mg | ORAL_TABLET | Freq: Every day | ORAL | Status: DC
  Administered 2019-02-22 – 2019-02-23 (×2): 12.5 mg via ORAL
  Filled 2019-02-22 (×2): qty 1

## 2019-02-22 MED ORDER — RAMELTEON 8 MG PO TABS
8.0000 mg | ORAL_TABLET | Freq: Every evening | ORAL | Status: AC | PRN
  Administered 2019-02-22: 8 mg via ORAL
  Filled 2019-02-22: qty 1

## 2019-02-22 MED ORDER — FUROSEMIDE 10 MG/ML IJ SOLN
120.0000 mg | Freq: Every day | INTRAVENOUS | Status: DC
  Filled 2019-02-22 (×3): qty 12

## 2019-02-22 MED ORDER — POTASSIUM CHLORIDE CRYS ER 20 MEQ PO TBCR
40.0000 meq | EXTENDED_RELEASE_TABLET | Freq: Two times a day (BID) | ORAL | Status: AC
  Administered 2019-02-22 (×2): 40 meq via ORAL
  Filled 2019-02-22 (×2): qty 2

## 2019-02-22 MED ORDER — METOPROLOL SUCCINATE ER 100 MG PO TB24
100.0000 mg | ORAL_TABLET | Freq: Every day | ORAL | Status: DC
  Filled 2019-02-22: qty 1

## 2019-02-22 MED ORDER — APIXABAN 5 MG PO TABS
5.0000 mg | ORAL_TABLET | Freq: Two times a day (BID) | ORAL | Status: DC
  Administered 2019-02-22 – 2019-02-24 (×5): 5 mg via ORAL
  Filled 2019-02-22 (×6): qty 1

## 2019-02-22 MED ORDER — PRAVASTATIN SODIUM 10 MG PO TABS
10.0000 mg | ORAL_TABLET | Freq: Every day | ORAL | Status: DC
  Administered 2019-02-22 – 2019-02-25 (×4): 10 mg via ORAL
  Filled 2019-02-22 (×5): qty 1

## 2019-02-22 MED ORDER — FUROSEMIDE 10 MG/ML IJ SOLN
120.0000 mg | Freq: Two times a day (BID) | INTRAVENOUS | Status: DC
  Filled 2019-02-22: qty 12

## 2019-02-22 MED ORDER — PAROXETINE HCL 20 MG PO TABS
20.0000 mg | ORAL_TABLET | Freq: Every day | ORAL | Status: DC
  Administered 2019-02-22 – 2019-02-25 (×4): 20 mg via ORAL
  Filled 2019-02-22 (×6): qty 1

## 2019-02-22 MED ORDER — ACETAMINOPHEN 325 MG PO TABS
650.0000 mg | ORAL_TABLET | Freq: Four times a day (QID) | ORAL | Status: DC | PRN
  Administered 2019-02-25: 650 mg via ORAL
  Filled 2019-02-22: qty 2

## 2019-02-22 MED ORDER — POTASSIUM CHLORIDE CRYS ER 20 MEQ PO TBCR: 20.0000 meq | EXTENDED_RELEASE_TABLET | Freq: Two times a day (BID) | ORAL | Status: DC

## 2019-02-22 MED ORDER — FUROSEMIDE 10 MG/ML IJ SOLN
80.0000 mg | Freq: Two times a day (BID) | INTRAMUSCULAR | Status: DC
  Administered 2019-02-22: 80 mg via INTRAVENOUS
  Filled 2019-02-22 (×2): qty 8

## 2019-02-22 MED ORDER — METOPROLOL SUCCINATE ER 50 MG PO TB24
50.0000 mg | ORAL_TABLET | Freq: Every day | ORAL | Status: DC
  Administered 2019-02-22: 50 mg via ORAL
  Filled 2019-02-22: qty 1

## 2019-02-22 NOTE — Progress Notes (Signed)
Rehab Admissions Coordinator Note:  Per PT and OT recommendation, this patient was screened by Raechel Ache for appropriateness for an Inpatient Acute Rehab Consult.  At this time, we are recommending an Inpatient Rehab consult. AC will contact attending service to request order.   Raechel Ache 02/22/2019, 3:43 PM  I can be reached at [phone].

## 2019-02-22 NOTE — Evaluation (Signed)
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 His fall was witnessed by his wife when he fell backward and hit his head. He did not lose consciousness. Pt with CHF exacerbation, afib with RVR, and COPD.  Clinical Impression  Pt demonstrates deficits in functional mobility, gait, balance, endurance, strength, safety awareness, and awareness of deficits. Pt with significant standing balance deficits and gait deviations, requiring modA during OOB activity to prevent falls multiple times from PT during short distance of ambulation and transfers. Pt with impaired LE coordination, scissoring intermittently during gait. Pt will benefit from continued acute PT services to reduce falls risk and aide in a return to his prior level of function.    Follow Up Recommendations CIR;Supervision/Assistance - 24 hour    Equipment Recommendations  Other (comment)(defer to post-acute setting)    Recommendations for Other Services Rehab consult     Precautions / Restrictions Precautions Precautions: Fall Restrictions Weight Bearing Restrictions: No      Mobility  Bed Mobility Overal bed mobility: Needs Assistance Bed Mobility: Sit to Supine;Supine to Sit     Supine to sit: Supervision Sit to supine: Min guard      Transfers Overall transfer level: Needs assistance Equipment used: Rolling walker (2 wheeled) Transfers: Sit to/from Stand Sit to Stand: Mod assist            Ambulation/Gait Ambulation/Gait assistance: Mod assist Gait Distance (Feet): 8 Feet Assistive device: Rolling walker (2 wheeled) Gait Pattern/deviations: Step-to pattern;Shuffle;Scissoring;Leaning  posteriorly;Staggering left;Staggering right Gait velocity: reduced Gait velocity interpretation: <1.31 ft/sec, indicative of household ambulator General Gait Details: pt with unsteady gait, scissoring intermittently, significant postural deviation requiring modA to prevent fall  Stairs            Wheelchair Mobility    Modified Rankin (Stroke Patients Only)       Balance Overall balance assessment: Needs assistance Sitting-balance support: Single extremity supported;Feet supported Sitting balance-Leahy Scale: Good Sitting balance - Comments: supervision   Standing balance support: Bilateral upper extremity supported Standing balance-Leahy Scale: Poor Standing balance comment: modA for dynamic standing balance                             Pertinent Vitals/Pain Pain Assessment: No/denies pain    Home Living Family/patient expects to be discharged to:: Private residence Living Arrangements: Spouse/significant other Available Help at Discharge: Family;Available 24 hours/day Type of Home: House Home Access: Stairs to enter Entrance Stairs-Rails: Can reach both Entrance Stairs-Number of Steps: 3 Home Layout: One level Home Equipment: Walker - 2 wheels;Cane - single point;Wheelchair - manual      Prior Function Level of Independence: Independent         Comments: pt reports ambulating independently up until last week, using RW last week due to weakness     Hand Dominance        Extremity/Trunk Assessment   Upper Extremity Assessment Upper Extremity Assessment: Defer to OT evaluation(pt with LUE weakness and grossly impaired grip strength)    Lower Extremity Assessment Lower Extremity Assessment: LLE deficits/detail LLE Deficits / Details: grossly 4/5 strength LLE Coordination: decreased gross motor(pt scissoring during gait at times)  Cervical / Trunk Assessment Cervical / Trunk Assessment: Kyphotic  Communication   Communication: No  difficulties  Cognition Arousal/Alertness: Awake/alert Behavior During Therapy: WFL for tasks assessed/performed Overall Cognitive Status: Impaired/Different from baseline Area of Impairment: Orientation;Safety/judgement                 Orientation Level: Disoriented to;Time       Safety/Judgement: Decreased awareness of safety;Decreased awareness of deficits            General Comments General comments (skin integrity, edema, etc.): VSS on RA    Exercises     Assessment/Plan    PT Assessment Patient needs continued PT services  PT Problem List Decreased strength;Decreased activity tolerance;Decreased balance;Decreased mobility;Decreased coordination;Decreased safety awareness;Decreased knowledge of precautions;Cardiopulmonary status limiting activity       PT Treatment Interventions DME instruction;Gait training;Stair training;Functional mobility training;Therapeutic activities;Therapeutic exercise;Balance training;Neuromuscular re-education;Patient/family education    PT Goals (Current goals can be found in the Care Plan section)  Acute Rehab PT Goals Patient Stated Goal: To improe mobility and walk independently PT Goal Formulation: With patient Time For Goal Achievement: 03/08/19 Potential to Achieve Goals: Good    Frequency Min 3X/week   Barriers to discharge        Co-evaluation               AM-PAC PT "6 Clicks" Mobility  Outcome Measure Help needed turning from your back to your side while in a flat bed without using bedrails?: None Help needed moving from lying on your back to sitting on the side of a flat bed without using bedrails?: A Little Help needed moving to and from a bed to a chair (including a wheelchair)?: A Lot Help needed standing up from a chair using your arms (e.g., wheelchair or bedside chair)?: A Lot Help needed to walk in hospital room?: A Lot Help needed climbing 3-5 steps with a railing? : Total 6 Click Score: 14     End of Session Equipment Utilized During Treatment: Gait belt Activity Tolerance: Patient tolerated treatment well Patient left: in chair;with call bell/phone within reach;with chair alarm set Nurse Communication: Mobility status PT Visit Diagnosis: Unsteadiness on feet (R26.81);Other abnormalities of gait and mobility (R26.89);Muscle weakness (generalized) (M62.81);Ataxic gait (R26.0)    Time: 1962-2297 PT Time Calculation (min) (ACUTE ONLY): 23 min   Charges:   PT Evaluation $PT Eval Moderate Complexity: 1 Mod PT Treatments $Gait Training: 8-22 mins        Zenaida Niece, PT, DPT Acute Rehabilitation Pager: [phone]   Zenaida Niece 02/22/2019, 1:10 PM

## 2019-02-22 NOTE — Plan of Care (Signed)

## 2019-02-22 NOTE — ED Notes (Signed)
Attempted to call report x 1  

## 2019-02-22 NOTE — ED Notes (Signed)
PAGED IM TO RN PATTI--Chad Franklin

## 2019-02-22 NOTE — Evaluation (Addendum)
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 His fall was witnessed by his wife when he fell backward and hit his head. He did not lose consciousness. Pt with CHF exacerbation, afib with RVR, and COPD.   Clinical Impression   Pt PTA: pt living with spouse at home, reports falls and decreased ability to care for self. Pt currently pt limited by decreased strength, poor mobility and decreased safety awareness. Pt performing mobility with modA overall for heavy assist for sit to stand and reducing wobble/lean due to residual weakness on L side from previous CVA and increased edema in BLEs. Pt's LLE crossing prior to getting to recliner. Pt minA to modA overall for self care tasks. Pt would benefit from continued OT skilled services for ADL,mobility and safety in CIR setting. OT following.    Follow Up Recommendations  CIR;Supervision/Assistance - 24 hour    Equipment Recommendations  3 in 1 bedside commode    Recommendations for Other Services       Precautions / Restrictions Precautions Precautions: Fall Precaution Comments: falls in the past few months Restrictions Weight Bearing Restrictions: No      Mobility Bed Mobility Overal bed mobility: Needs Assistance Bed Mobility: Sit to Supine;Supine to Sit     Supine to sit: Supervision Sit to supine: Min guard      Transfers Overall transfer level: Needs assistance Equipment used: Rolling walker (2 wheeled) Transfers: Sit to/from Stand Sit to Stand: Mod assist         General transfer comment: Posterior lean; requires verbal cueing to stand upright    Balance Overall balance assessment: Needs  assistance Sitting-balance support: Single extremity supported;Feet supported Sitting balance-Leahy Scale: Good Sitting balance - Comments: supervision   Standing balance support: Bilateral upper extremity supported Standing balance-Leahy Scale: Poor Standing balance comment: modA for dynamic standing balance                           ADL either performed or assessed with clinical judgement   ADL Overall ADL's : Needs assistance/impaired Eating/Feeding: Set up;Sitting   Grooming: Min guard;Minimal assistance;Standing;Cueing for safety Grooming Details (indicate cue type and reason): pt stood at sink with minguardA to minA for stability. Pt with a posterior lean in standing Upper Body Bathing: Min guard;Sitting;Standing;Cueing for safety   Lower Body Bathing: Moderate assistance;Cueing for safety;Cueing for sequencing;Sitting/lateral leans;Sit to/from stand   Upper Body Dressing : Min guard;Sitting;Standing   Lower Body Dressing: Moderate assistance;Cueing for safety;Sitting/lateral leans;Sit to/from stand   Toilet Transfer: Moderate assistance;Cueing for safety;Cueing for sequencing;Stand-pivot;BSC;Ambulation   Toileting- Clothing Manipulation and Hygiene: Moderate assistance;Cueing for safety;Cueing for sequencing;Sitting/lateral lean;Sit to/from stand       Functional mobility during ADLs: Moderate assistance;Rolling walker;Cueing for safety;Cueing for sequencing General ADL Comments: pt limited by decreased strength, poor mobility and decreased safety awareness.     Vision Baseline Vision/History: No visual deficits Vision Assessment?: No apparent visual deficits     Perception     Praxis      Pertinent Vitals/Pain Pain Assessment: No/denies pain     Hand Dominance Right   Extremity/Trunk Assessment Upper Extremity Assessment Upper Extremity Assessment: LUE deficits/detail LUE  Deficits / Details: s/p previous CVA weakness 2/5 MM grade   Lower  Extremity Assessment Lower Extremity Assessment: Defer to PT evaluation;Generalized weakness LLE Deficits / Details: grossly 4/5 strength LLE Coordination: decreased gross motor(pt scissoring during gait at times)   Cervical / Trunk Assessment Cervical / Trunk Assessment: Kyphotic   Communication Communication Communication: No difficulties   Cognition Arousal/Alertness: Awake/alert Behavior During Therapy: WFL for tasks assessed/performed Overall Cognitive Status: Impaired/Different from baseline Area of Impairment: Orientation;Safety/judgement;Problem solving                 Orientation Level: Disoriented to;Time       Safety/Judgement: Decreased awareness of safety;Decreased awareness of deficits   Problem Solving: Requires verbal cues     General Comments  VSS    Exercises     Shoulder Instructions      Home Living Family/patient expects to be discharged to:: Private residence Living Arrangements: Spouse/significant other Available Help at Discharge: Family;Available 24 hours/day Type of Home: House Home Access: Stairs to enter [employer] of Steps: 3 Entrance Stairs-Rails: Can reach both Home Layout: One level     Bathroom Shower/Tub: Occupational psychologist: Standard     Home Equipment: Environmental consultant - 2 wheels;Cane - single point;Wheelchair - manual          Prior Functioning/Environment Level of Independence: Independent        Comments: pt reports ambulating independently up until last week, using RW last week due to weakness        OT Problem List: Decreased strength;Decreased activity tolerance;Impaired balance (sitting and/or standing);Decreased safety awareness;Pain      OT Treatment/Interventions: Self-care/ADL training;Energy conservation;Therapeutic exercise;DME and/or AE instruction;Therapeutic activities;Patient/family education;Balance training    OT Goals(Current goals can be found in the care plan section)  Acute Rehab OT Goals Patient Stated Goal: to increase ability OT Goal Formulation: With patient Time For Goal Achievement: 03/08/19 Potential to Achieve Goals: Good ADL Goals Pt Will Perform Grooming: with supervision;standing Pt Will Perform Lower Body Dressing: with min assist;with adaptive equipment;sitting/lateral leans;sit to/from stand Pt Will Transfer to Toilet: with min guard assist;ambulating;bedside commode Pt Will Perform Toileting - Clothing Manipulation and hygiene: with min guard assist;sit to/from stand;sitting/lateral leans Pt/caregiver will Perform Home Exercise Program: Increased strength;Both right and left upper extremity;With Supervision  OT Frequency: Min 2X/week   Barriers to D/C: Decreased caregiver support  no physical assist at home       Co-evaluation              AM-PAC OT "6 Clicks" Daily Activity     Outcome Measure Help from another person eating meals?: None Help from another person taking care of personal grooming?: A Little Help from another person toileting, which includes using toliet, bedpan, or urinal?: A Lot Help from another person bathing (including washing, rinsing, drying)?: A Lot Help from another person to put on and taking off regular upper body clothing?: A Little Help from another person to put on and taking off regular lower body clothing?: A Lot 6 Click Score: 16   End of Session Equipment Utilized During Treatment: Gait belt;Rolling walker Nurse Communication: Mobility status  Activity Tolerance: Patient tolerated treatment well Patient left: in chair;with call bell/phone within reach;with chair alarm set  OT Visit Diagnosis: Unsteadiness on feet (R26.81);Muscle weakness (generalized) (M62.81)                Time: 1250-1311 OT Time Calculation (min): 21 min Charges:  OT General Charges $OT Visit:  1 Visit OT Evaluation $OT Eval Moderate Complexity: 1 Mod  Cristi Loron) Glendell Docker OTR/L Acute Rehabilitation  Services Pager: [phone] Office: [phone]   Lonzo Cloud 02/22/2019, 2:16 PM

## 2019-02-22 NOTE — Consult Note (Signed)
Cardiology Consult Note    Patient ID: Chad Franklin MRN: [id number]; DOB: [birth year]   Admission date: 02/21/2019  Primary Care Provider: Lonie Peakonroy, Nathan, PA-C Primary Cardiologist: Jodelle RedBridgette Christopher, MD  Primary Electrophysiologist:  None   Chief Complaint:  Dyspnea, fall  Patient Profile:   Chad Franklin is a 84 y.o. male with DM, COPD, permanent AF on apixaban, CVA 10/2017, HFrEF 25-30%, who presents with dyspnea.  History of Present Illness:   Patient had a fall at home witnessed by his wife.  He fell backward and hit his head.  Did not lose consciousness.  Has had increasing falls and worsening weakness over the past month.  In addition describes increased dyspnea with exertion, lower extremity swelling, and weight gain over the past month.  Baseline weight has increased from around 2 20-2 40 LBS.  PCP increased his Lasix dose on 12/1 from 80 mg to 160 mg.  Also has had palpitations for the past 3 to 4 weeks.  ECG: AF HR 142 bpm, no ischemia CXR: no acute abnormalities CT head: NAICP Cr 1.2, K 3.1, Troponin 28 -> 47 -> 93  Cardiology consulted for possible NSTEMI.  Patient denies chest pain but does occasionally get a funny feeling on the left side of his chest.  It is unclear if this is exertional or not (he is a difficult historian).  At times it will occur when he is working or doing other activity.  This has been going on for a few weeks maybe.  No symptoms currently.  Meds given: diltiazem 15 mg IV followed by infusion, potassium 40, lasix 120 mg  Cardiac history: admitted with CVA 10/2017, found to have new diagnosis of systolic heart failure during that admission. He was found to be in afib RVR at the time.  Plan was rate control.  He was placed on carvedilol and losartan, as well as changed from coumadin to apixaban given the stroke occurred on coumadin. EF did not improve with medical therapy. Decision made not to pursue ICD after discussion with patient and  family.  Metoprolol increased in August due to HR 107 bpm.  03/25/18 Echo LV EF 25-30, diffuse hypokinesis, severe LA dilation 11/15/17 Echo LV EF 35-40%,  9/2019Myoview : No ischemia, fixed defects in inferolateral wall possibly infarct   Heart Pathway Score:      Past Medical History:  Diagnosis Date   Bilateral swelling of feet    COPD (chronic obstructive pulmonary disease) (HCC)    Depression    Diabetes mellitus    GERD (gastroesophageal reflux disease)    Palpitations    Stroke (HCC)    2    Past Surgical History:  Procedure Laterality Date   GALLBLADDER SURGERY     HERNIA REPAIR       Medications Prior to Admission: Prior to Admission medications   Medication Sig Start Date End Date Taking? Authorizing Provider  acetaminophen (TYLENOL) 500 MG tablet Take 1 tablet (500 mg total) by mouth every 8 (eight) hours as needed. Patient taking differently: Take 500 mg by mouth 2 (two) times daily.  12/04/17  Yes Love, Evlyn KannerPamela S, PA-C  calcium carbonate (TUMS - DOSED IN MG ELEMENTAL CALCIUM) 500 MG chewable tablet Chew 1 tablet by mouth 3 (three) times daily as needed for indigestion or heartburn.   Yes [provider]  ELIQUIS 5 MG TABS tablet TAKE 1 TABLET BY MOUTH TWICE DAILY Patient taking differently: Take 5 mg by mouth 2 (two) times daily.  07/25/18  Yes Jodelle Red, MD  furosemide (LASIX) 80 MG tablet Take 1 tablet (80 mg total) by mouth daily. 03/25/18  Yes Jodelle Red, MD  loratadine (CLARITIN) 10 MG tablet Take 10 mg by mouth daily as needed for allergies.   Yes [provider]  metoprolol succinate (TOPROL-XL) 50 MG 24 hr tablet Take 1 tablet (50 mg total) by mouth daily. Take with or immediately following a meal. Patient taking differently: Take 25 mg by mouth daily. Take with or immediately following a meal. 12/05/18 11/30/19 Yes Jodelle Red, MD  PARoxetine (PAXIL) 20 MG tablet Take 1 tablet (20 mg total) by mouth  daily. Patient taking differently: Take 40 mg by mouth every morning.  12/04/17  Yes Love, Evlyn Kanner, PA-C  potassium chloride SA (K-DUR,KLOR-CON) 20 MEQ tablet Take 1 tablet (20 mEq total) by mouth daily. 03/25/18  Yes Jodelle Red, MD  pravastatin (PRAVACHOL) 10 MG tablet Take 1 tablet (10 mg total) by mouth daily. 03/25/18  Yes Jodelle Red, MD  methylphenidate (RITALIN) 10 MG tablet Take 0.5 tablets (5 mg total) by mouth 2 (two) times daily with breakfast and lunch. Patient not taking: Reported on 02/21/2019 01/16/19   Marcello Fennel, MD     Allergies:    Allergies  Allergen Reactions   Naproxen Sodium Anaphylaxis, Hives, Shortness Of Breath and Swelling    Social History:   Social History   Socioeconomic History   Marital status: Married    Spouse name: Not on file   Number of children: Not on file   Years of education: Not on file   Highest education level: Not on file  Occupational History   Not on file  Social Needs   Financial resource strain: Not on file   Food insecurity    Worry: Not on file    Inability: Not on file   Transportation needs    Medical: Not on file    Non-medical: Not on file  Tobacco Use   Smoking status: Former Smoker   Smokeless tobacco: Never Used  Substance and Sexual Activity   Alcohol use: No    Comment: past drinker   Drug use: Not Currently   Sexual activity: Not on file  Lifestyle   Physical activity    Days per week: Not on file    Minutes per session: Not on file   Stress: Not on file  Relationships   Social connections    Talks on phone: Not on file    Gets together: Not on file    Attends religious service: Not on file    Active member of club or organization: Not on file    Attends meetings of clubs or organizations: Not on file    Relationship status: Not on file   Intimate partner violence    Fear of current or ex partner: Not on file    Emotionally abused: Not on file    Physically  abused: Not on file    Forced sexual activity: Not on file  Other Topics Concern   Not on file  Social History Narrative   Not on file     Family History:   The patient's family history includes Hypertension in his father; Stroke in his sister.    ROS:  Please see the history of present illness.  All other ROS reviewed and negative.     Physical Exam/Data:   Vitals:   02/22/19 0415 02/22/19 0430 02/22/19 0519 02/22/19 0526  BP: (!) 129/91 127/90 Marland Kitchen)  143/90   Pulse: 92 94 (!) 102   Resp:   20   Temp:   98.6 F (37 C)   TempSrc:   Oral   SpO2: 94% 94% 97%   Weight:    94.3 kg  Height:    5\' 8"  (1.727 m)    Intake/Output Summary (Last 24 hours) at 02/22/2019 0554 Last data filed at 02/22/2019 0043 Gross per 24 hour  Intake 50 ml  Output 1600 ml  Net -1550 ml   Last 3 Weights 02/22/2019 02/21/2019 01/16/2019  Weight (lbs) 208 lb 220 lb 226 lb  Weight (kg) 94.348 kg 99.791 kg 102.513 kg     Body mass index is 31.63 kg/m.  Wt Readings from Last 3 Encounters:  02/22/19 94.3 kg  01/16/19 102.5 kg  11/04/18 98.5 kg    Physical Exam: General: Well developed, well nourished, in no acute distress. Head: Normocephalic, atraumatic, sclera non-icteric, no xanthomas, nares are without discharge.  Neck: Negative for carotid bruits. JVD not elevated. Lungs: Clear bilaterally to auscultation without wheezes, rales, or rhonchi. Breathing is unlabored. Heart: RRR with S1 S2. No murmurs, rubs, or gallops appreciated. Abdomen: Soft, non-tender, non-distended with normoactive bowel sounds. No hepatomegaly. No rebound/guarding. No obvious abdominal masses. Msk:  Strength and tone appear normal for age. Extremities: No clubbing or cyanosis. No edema.  Distal pedal pulses are 2+ and equal bilaterally. Neuro: Alert and oriented X 3. No focal deficit. No facial asymmetry. Moves all extremities spontaneously. Psych:  Responds to questions appropriately with a normal affect.    EKG:   The ECG that was done  was personally reviewed and demonstrates AF without ischemia  Relevant CV Studies: See HPI  Laboratory Data:  High Sensitivity Troponin:   Recent Labs  Lab 02/21/19 2003 02/21/19 2256 02/22/19 0321  TROPONINIHS 28* 47* 93*      Cardiac EnzymesNo results for input(s): TROPONINI in the last 168 hours. No results for input(s): TROPIPOC in the last 168 hours.  Chemistry Recent Labs  Lab 02/21/19 2003 02/22/19 0321  NA 139 140  K 3.1* 4.7  CL 99 102  CO2 30 28  GLUCOSE 163* 126*  BUN 29* 25*  CREATININE 1.20 1.13  CALCIUM 8.3* 8.0*  GFRNONAA 56* >60  GFRAA >60 >60  ANIONGAP 10 10    Recent Labs  Lab 02/21/19 2003 02/22/19 0321  PROT 7.4 6.9  ALBUMIN 3.2* 3.0*  AST 36 54*  ALT 25 20  ALKPHOS 88 70  BILITOT 1.0 1.3*   Hematology Recent Labs  Lab 02/21/19 2003 02/22/19 0321  WBC 6.5 9.1  RBC 4.57 4.35  HGB 13.3 12.6*  HCT 43.5 41.1  MCV 95.2 94.5  MCH 29.1 29.0  MCHC 30.6 30.7  RDW 14.9 14.8  PLT 221 220   BNP Recent Labs  Lab 02/21/19 2003  BNP 1,554.5*    DDimer No results for input(s): DDIMER in the last 168 hours.   Radiology/Studies:  Dg Chest 2 View  Result Date: 02/21/2019 CLINICAL DATA:  Tripped and fell in bathroom, struck head, chest pain, diabetes mellitus, COPD, GERD, hypertension EXAM: CHEST - 2 VIEW COMPARISON:  11/19/2017 FINDINGS: Enlargement of cardiac silhouette. Mediastinal contours and pulmonary vascularity normal. Minimal chronic interstitial prominence and chronic bronchitic changes, stable. No acute infiltrate, pleural effusion, or pneumothorax. Diffuse osseous demineralization. Atherosclerotic calcification aorta. IMPRESSION: Enlargement of cardiac silhouette. Minimal chronic bronchitic and interstitial changes. No acute abnormalities. Electronically Signed   By: Lavonia Dana M.D.   On: 02/21/2019  20:25   Ct Head Wo Contrast  Result Date: 02/21/2019 CLINICAL DATA:  Head trauma after a fall EXAM: CT HEAD  WITHOUT CONTRAST TECHNIQUE: Contiguous axial images were obtained from the base of the skull through the vertex without intravenous contrast. COMPARISON:  November 15, 2017 FINDINGS: Brain: No evidence of acute territorial infarction, hemorrhage, hydrocephalus,extra-axial collection or mass lesion/mass effect. There is dilatation the ventricles and sulci consistent with age-related atrophy. Low-attenuation changes in the deep white matter consistent with small vessel ischemia. Areas of encephalomalacia involving the left parietal lobe. There is lacunar infarct involving the right internal capsule and right corona radiata. Vascular: No hyperdense vessel or unexpected calcification. Skull: The skull is intact. No fracture or focal lesion identified. Sinuses/Orbits: The visualized paranasal sinuses and mastoid air cells are clear. The orbits and globes intact. Other: None IMPRESSION: No acute intracranial abnormality. Findings consistent with age related atrophy and chronic small vessel ischemia Encephalomalacia in the left parietal lobe, lacunar infarcts in the right internal capsule and corona radiata. Electronically Signed   By: Jonna Clark M.D.   On: 02/21/2019 21:31    Assessment and Plan   #. AF with RVR #. Acute on chronic systolic heart failure He has had worsening dyspnea with exertion for the past several weeks, and was found to be in A. fib with RVR with a heart rate in the 140s on admission.  His BNP is significantly elevated, although chest x-ray is without pulmonary edema.  Agree with aggressive rate control of A. fib and diuresis with IV Lasix until he is euvolemic.  He appears to be responding well to diltiazem, without any adverse effect despite his heart failure.  We can consider the addition of p.o. diltiazem to his rate control regimen versus increasing his p.o. metoprolol. --IV Lasix, redose daily until euvolemic --Continue diltiazem infusion; can consider replacing with oral diltiazem once  stable --Continue metoprolol 50 mg --Continue apixaban 5 mg  #. NSTEMI Found to have elevated troponin with no ischemia on ECG.  Delta was 28-93.  Most likely due to A. fib with RVR + HF.  Does not have chest pain or pressure.  He has been having a funny feeling on the left side of his chest, it is unclear what this is.  As this seems most likely related to A. fib and heart failure, and not acute plaque rupture, I do not think there is any benefit to IV heparin.  He will be anticoagulated on apixaban.  We can consider repeat stress test prior to discharge, and compare to prior. -- no IV heparin for now -- agree with echocardiogram -- can consider repeat Myoview prior to discharge   For questions or updates, please contact [facility] Please consult [email or web address] for contact info under      Signed, Quinlan Vollmer S, MD 02/22/2019, 5:54 AM

## 2019-02-22 NOTE — Progress Notes (Signed)
Orthopedic Tech Progress Note Patient Details:  Chad Franklin 1934/11/18 [id number]  Ortho Devices Type of Ortho Device: Louretta Parma boot Ortho Device/Splint Location: bilateral Ortho Device/Splint Interventions: Application, Ordered   Post Interventions Patient Tolerated: Well Instructions Provided: Care of device, Adjustment of device   Janit Pagan 02/22/2019, 12:02 PM

## 2019-02-22 NOTE — Progress Notes (Signed)
  Echocardiogram 2D Echocardiogram has been performed.  Chad Franklin 02/22/2019, 9:53 AM

## 2019-02-22 NOTE — Progress Notes (Addendum)
Progress Note  Patient Name: Chad Franklin Date of Encounter: 02/22/2019  Primary Cardiologist: Jodelle Red, MD   Subjective   Patient states he is breathing better but still very volume overloaded. Had to sleep on incline last night. No palpitations.  Inpatient Medications    Scheduled Meds: . alum & mag hydroxide-simeth  30 mL Oral Once  . apixaban  5 mg Oral BID  . [START ON 02/23/2019] metoprolol succinate  100 mg Oral Daily  . PARoxetine  20 mg Oral Daily  . pravastatin  10 mg Oral Daily   Continuous Infusions: . diltiazem (CARDIZEM) infusion Stopped (02/22/19 0850)  . furosemide     PRN Meds: acetaminophen **OR** acetaminophen   Vital Signs    Vitals:   02/22/19 0430 02/22/19 0519 02/22/19 0526 02/22/19 0817  BP: 127/90 (!) 143/90  (!) 154/130  Pulse: 94 (!) 102  92  Resp:  20  18  Temp:  98.6 F (37 C)  97.6 F (36.4 C)  TempSrc:  Oral  Oral  SpO2: 94% 97%  96%  Weight:   94.3 kg   Height:   5\' 8"  (1.727 m)     Intake/Output Summary (Last 24 hours) at 02/22/2019 1200 Last data filed at 02/22/2019 1037 Gross per 24 hour  Intake 540 ml  Output 2050 ml  Net -1510 ml   Last 3 Weights 02/22/2019 02/21/2019 01/16/2019  Weight (lbs) 208 lb 220 lb 226 lb  Weight (kg) 94.348 kg 99.791 kg 102.513 kg      Telemetry    Atrial fibrillation with rates in the 90's to 120's. - Personally Reviewed  ECG    Rate controlled atrial fibrillation with non-specific ST/T changes. - Personally Reviewed  Physical Exam   GEN: No acute distress.   Neck: Supple. Cardiac: Tachycardic with irregularly irregular rhythm. No murmurs, rubs, or gallops.  Respiratory: No significant increased work of breathing. Bibasilar crackles noted. GI: Soft, nontender, non-distended. Bowel sounds present.  MS: 2+ pitting edema of bilateral lower extremities. No deformity. Neuro:  Nonfocal  Psych: Normal affect   Labs    High Sensitivity Troponin:   Recent Labs  Lab  02/21/19 2003 02/21/19 2256 02/22/19 0321  TROPONINIHS 28* 47* 93*      Chemistry Recent Labs  Lab 02/16/19 1727 02/21/19 2003 02/22/19 0321  NA 141 139 140  K 3.5 3.1* 4.7  CL 102 99 102  CO2 29 30 28   GLUCOSE 104* 163* 126*  BUN 20 29* 25*  CREATININE 1.17 1.20 1.13  CALCIUM 8.7* 8.3* 8.0*  PROT  --  7.4 6.9  ALBUMIN  --  3.2* 3.0*  AST  --  36 54*  ALT  --  25 20  ALKPHOS  --  88 70  BILITOT  --  1.0 1.3*  GFRNONAA 58* 56* >60  GFRAA >60 >60 >60  ANIONGAP 10 10 10      Hematology Recent Labs  Lab 02/16/19 1727 02/21/19 2003 02/22/19 0321  WBC 6.5 6.5 9.1  RBC 4.46 4.57 4.35  HGB 12.9* 13.3 12.6*  HCT 42.6 43.5 41.1  MCV 95.5 95.2 94.5  MCH 28.9 29.1 29.0  MCHC 30.3 30.6 30.7  RDW 14.7 14.9 14.8  PLT 266 221 220    BNP Recent Labs  Lab 02/21/19 2003  BNP 1,554.5*     DDimer No results for input(s): DDIMER in the last 168 hours.   Radiology    Dg Chest 2 View  Result Date: 02/21/2019 CLINICAL  DATA:  Tripped and fell in bathroom, struck head, chest pain, diabetes mellitus, COPD, GERD, hypertension EXAM: CHEST - 2 VIEW COMPARISON:  11/19/2017 FINDINGS: Enlargement of cardiac silhouette. Mediastinal contours and pulmonary vascularity normal. Minimal chronic interstitial prominence and chronic bronchitic changes, stable. No acute infiltrate, pleural effusion, or pneumothorax. Diffuse osseous demineralization. Atherosclerotic calcification aorta. IMPRESSION: Enlargement of cardiac silhouette. Minimal chronic bronchitic and interstitial changes. No acute abnormalities. Electronically Signed   By: Ulyses Southward M.D.   On: 02/21/2019 20:25   Ct Head Wo Contrast  Result Date: 02/21/2019 CLINICAL DATA:  Head trauma after a fall EXAM: CT HEAD WITHOUT CONTRAST TECHNIQUE: Contiguous axial images were obtained from the base of the skull through the vertex without intravenous contrast. COMPARISON:  November 15, 2017 FINDINGS: Brain: No evidence of acute territorial  infarction, hemorrhage, hydrocephalus,extra-axial collection or mass lesion/mass effect. There is dilatation the ventricles and sulci consistent with age-related atrophy. Low-attenuation changes in the deep white matter consistent with small vessel ischemia. Areas of encephalomalacia involving the left parietal lobe. There is lacunar infarct involving the right internal capsule and right corona radiata. Vascular: No hyperdense vessel or unexpected calcification. Skull: The skull is intact. No fracture or focal lesion identified. Sinuses/Orbits: The visualized paranasal sinuses and mastoid air cells are clear. The orbits and globes intact. Other: None IMPRESSION: No acute intracranial abnormality. Findings consistent with age related atrophy and chronic small vessel ischemia Encephalomalacia in the left parietal lobe, lacunar infarcts in the right internal capsule and corona radiata. Electronically Signed   By: Jonna Clark M.D.   On: 02/21/2019 21:31    Cardiac Studies   Echocardiogram 03/25/2018: Study Conclusions: - Left ventricle: The cavity size was normal. Wall thickness was   increased in a pattern of mild LVH. Systolic function was   severely reduced. The estimated ejection fraction was in the   range of 25% to 30%. Diffuse hypokinesis. The study is not   technically sufficient to allow evaluation of LV diastolic   function. - Aortic valve: There was trivial regurgitation. - Mitral valve: There was moderate regurgitation. - Right ventricle: Systolic function was mildly reduced. - Tricuspid valve: There was mild regurgitation. - Pulmonary arteries: Systolic pressure was mildly increased. PA   peak pressure: 38 mm Hg (S).  Impressions: - Compared to the prior study, there has been no significant   interval change.  Patient Profile     84 y.o. male with a history of chronic systolic CHF with EF of 25-30% on Echo in 03/2018, permanent atrial fibrillation on Eliquis, CVA, diabetes mellitus,  COPD, who presented after a fall at home. Also reported recent shortness of breath, orthopnea, and edema. Found to be in atrial fibrillation with RVR and decompensated CHF on presentation.   Assessment & Plan    Acute on Chronic Systolic CHF - BNP elevated in the 1550s.  - Chest x-ray showed no overt edema. - Most recent Echo from 03/2018 showed LVEF of 25-30% with diffuse hypokinesis.  - Repeat Echo pending. - Patient started on IV Lasix 120mg  daily yesterday. Documented urinary output of 2.05 L since then. Weight down 12 lbs overnight (unsure if this is accurate). Renal function stable. - Patient still very volume overloaded on exam. Will continue current dose of Lasix for now.  - Continue Toprol-XL.  - Patient has been unable to tolarate ACEI/ARB in the past due to hypotension.  - Monitor daily weights, strict I/Os, and renal function. - Will also order unna boots to help with  lower extremity edema.  Atrial Fibrillation with RVR - Patient has history of permanent atrial fibrillation. Rates currently in 90's to 120's.  - Continue IV Cardizem drip for now. - Will increase Toprol-XL to 100mg  daily. - Continue chronic anticoagulation with Eliquis 5mg  twice daily.  Elevated Troponin - High-sensitivity troponin elevated 28 >> 47 >> 93. - No acute EKG changes.  - Suspect demand ischemia secondary to acute CHF and atrial fibrillation with RVR. - Further recommendations pending Echo results.  Otherwise, per primary team.  For questions or updates, please contact [place] Please consult [email or web address] for contact info under     Signed, Ena Dawley, MD  02/22/2019, 12:00 PM   Patient seen and examined, note reviewed with the signed Advanced Practice Provider. I personally reviewed laboratory data, imaging studies and relevant notes. I independently examined the patient and formulated the important aspects of the plan. I have personally discussed the plan with the patient and/or  family. Comments or changes to the note/plan are indicated below.  The patient remains significantly fluid overloaded, I will switch lasix 80 mg iv BID, crea is stable at 1.13, we will obtain UNNA boots and start physical therapy. Weight are inaccurate. Ventricular rates remain elevated, we will continue cardizem drip for now. I will add spironolactone 12.5 mg po daily and losartan 25 mg po daily. His LVEF is down to 15-20% from 25-30% in January 2020, RVEF is severely decreased and dilated with signs of fluid and volume overload. We  Should plan for a right and left cath once he is diuresed.  Time spent: 30 minutes-Greater than 50% of this time was spent in counseling, explanation of diagnosis, planning of further management, and coordination of care. The patient was seen by a fellow earlier today, however required further attention for appropriate management.  Ena Dawley, MD 02/22/2019

## 2019-02-22 NOTE — ED Notes (Signed)
Notified MD of elevated Trop

## 2019-02-22 NOTE — Progress Notes (Signed)
Subjective:   Pt seen at the beside on rounds this AM. Pt did not get any sleep last night and tired this AM. Denies having any chest pain , palpitations, or abdominal pain. Had a bowel movement a few minutes before the team entered the room. States that his breathing is a little better this AM. The patient feels like his belly has gotten bigger. Daughter at the bedside states that he gained 20 lbs in the last month. Edema has improved from last night. Pt has a scabbed lesion on his L hand that he states he got 2 weeks ago after handling a rock. Daughter states the wound used to be twice the size. Wound on the back of his head from falling.  We discussed today we plan on obtaining an echocardiogram as well as communicate with cardiology. Pt understands.   Objective:  Vital signs in last 24 hours: Vitals:   02/22/19 0415 02/22/19 0430 02/22/19 0519 02/22/19 0526  BP: (!) 129/91 127/90 (!) 143/90   Pulse: 92 94 (!) 102   Resp:   20   Temp:   98.6 F (37 C)   TempSrc:   Oral   SpO2: 94% 94% 97%   Weight:    94.3 kg  Height:    5\' 8"  (1.727 m)    Physical Exam: Physical Exam  Constitutional: He is oriented to person, place, and time and well-developed, well-nourished, and in no distress.  HENT:  Head: Normocephalic and atraumatic.  Eyes: EOM are normal.  Neck: Normal range of motion.  Cardiovascular: Normal rate, normal heart sounds and intact distal pulses. An irregularly irregular rhythm present. Exam reveals no gallop and no friction rub.  No murmur heard. Pulmonary/Chest: Effort normal and breath sounds normal. No respiratory distress. He exhibits no tenderness.  Abdominal: Soft. Bowel sounds are normal. He exhibits distension. There is no abdominal tenderness. There is no guarding.  Musculoskeletal: Normal range of motion.        General: Edema (2-3+ pitting edema bilaterally to his knee) present.  Neurological: He is alert and oriented to person, place, and time.  Skin:  Skin is warm and dry.  Small hematoma on the back of his head Healing hand wound with crust/scab on left thenar eminence approximately 3.4-5 cm diameter        Pertinent labs/Imaging: CBC Latest Ref Rng & Units 02/22/2019 02/21/2019 02/16/2019  WBC 4.0 - 10.5 K/uL 9.1 6.5 6.5  Hemoglobin 13.0 - 17.0 g/dL 12.6(L) 13.3 12.9(L)  Hematocrit 39.0 - 52.0 % 41.1 43.5 42.6  Platelets 150 - 400 K/uL 220 221 266    CMP Latest Ref Rng & Units 02/22/2019 02/21/2019 02/16/2019  Glucose 70 - 99 mg/dL 126(H) 163(H) 104(H)  BUN 8 - 23 mg/dL 25(H) 29(H) 20  Creatinine 0.61 - 1.24 mg/dL 1.13 1.20 1.17  Sodium 135 - 145 mmol/L 140 139 141  Potassium 3.5 - 5.1 mmol/L 4.7 3.1(L) 3.5  Chloride 98 - 111 mmol/L 102 99 102  CO2 22 - 32 mmol/L 28 30 29   Calcium 8.9 - 10.3 mg/dL 8.0(L) 8.3(L) 8.7(L)  Total Protein 6.5 - 8.1 g/dL 6.9 7.4 -  Total Bilirubin 0.3 - 1.2 mg/dL 1.3(H) 1.0 -  Alkaline Phos 38 - 126 U/L 70 88 -  AST 15 - 41 U/L 54(H) 36 -  ALT 0 - 44 U/L 20 25 -    Assessment/Plan:  Active Problems:   Atrial fibrillation with RVR (HCC)   Acute exacerbation of CHF (congestive heart  failure) [facility])   Patient Summary: Mr. Kassa is an 84 y.o. male with pertinent past medical history of embolic CVA (residual Lt side weakness), Afib (Eliquis), HFrEF (25-30%), COPD, and HTN, who presented with acute on chronic CHF 2/2 to Afib with RVR.   #Acute on chronic systolic heart failure 2/2 to A fib with RVR - Continue IV diuretic therapy - patient urinating well and breathing has improved. - Continue strict ins and outs and daily weights - baseline weight of 220 with reported increase in weight to 240. As of this morning is weight was 207, but I am not sure how reliable this weight is.   - On metoprolol, but unable to tolerate ACE/ARB due to hypotension - Echo pending  # A fib with RVR: - Patient started on diltiazem ggt will default to cardiology for transition to increased dose of beta blocker versus  oral diltiazem . - Rate well controlled at this time.  - Appreciate cardiologies recommendations  - Continue apixaban for anticoagulation   #Fall: - continue PT/OT recommendations - Baseline: lives at home and independent with ADLS/IADLS  Diet: Heart Healthy IVF: none VTE: Eliquis Code: partial - DNI  Dispo: Anticipated discharge in 1 to 2 days.   Dellia Cloud, MD 02/22/2019, 6:06 AM Pager: [phone]

## 2019-02-23 ENCOUNTER — Inpatient Hospital Stay: Payer: Self-pay

## 2019-02-23 ENCOUNTER — Inpatient Hospital Stay (HOSPITAL_COMMUNITY): Payer: Medicare HMO

## 2019-02-23 DIAGNOSIS — I5023 Acute on chronic systolic (congestive) heart failure: Secondary | ICD-10-CM

## 2019-02-23 DIAGNOSIS — R570 Cardiogenic shock: Secondary | ICD-10-CM | POA: Diagnosis not present

## 2019-02-23 LAB — COOXEMETRY PANEL
Carboxyhemoglobin: 0.9 % (ref 0.5–1.5)
Methemoglobin: 0.4 % (ref 0.0–1.5)
O2 Saturation: 36.3 %
Total hemoglobin: 12.6 g/dL (ref 12.0–16.0)

## 2019-02-23 LAB — COMPREHENSIVE METABOLIC PANEL
ALT: 32 U/L (ref 0–44)
AST: 58 U/L — ABNORMAL HIGH (ref 15–41)
Albumin: 2.8 g/dL — ABNORMAL LOW (ref 3.5–5.0)
Alkaline Phosphatase: 67 U/L (ref 38–126)
Anion gap: 13 (ref 5–15)
BUN: 32 mg/dL — ABNORMAL HIGH (ref 8–23)
CO2: 26 mmol/L (ref 22–32)
Calcium: 8.4 mg/dL — ABNORMAL LOW (ref 8.9–10.3)
Chloride: 101 mmol/L (ref 98–111)
Creatinine, Ser: 1.62 mg/dL — ABNORMAL HIGH (ref 0.61–1.24)
GFR calc Af Amer: 45 mL/min — ABNORMAL LOW (ref >60)
GFR calc non Af Amer: 39 mL/min — ABNORMAL LOW (ref >60)
Glucose, Bld: 136 mg/dL — ABNORMAL HIGH (ref 70–99)
Potassium: 3.8 mmol/L (ref 3.5–5.1)
Sodium: 140 mmol/L (ref 135–145)
Total Bilirubin: 1.4 mg/dL — ABNORMAL HIGH (ref 0.3–1.2)
Total Protein: 6.4 g/dL — ABNORMAL LOW (ref 6.5–8.1)

## 2019-02-23 LAB — HEPATIC FUNCTION PANEL
ALT: 35 U/L (ref 0–44)
AST: 61 U/L — ABNORMAL HIGH (ref 15–41)
Albumin: 2.8 g/dL — ABNORMAL LOW (ref 3.5–5.0)
Alkaline Phosphatase: 67 U/L (ref 38–126)
Bilirubin, Direct: 0.6 mg/dL — ABNORMAL HIGH (ref 0.0–0.2)
Indirect Bilirubin: 1.2 mg/dL — ABNORMAL HIGH (ref 0.3–0.9)
Total Bilirubin: 1.8 mg/dL — ABNORMAL HIGH (ref 0.3–1.2)
Total Protein: 6.5 g/dL (ref 6.5–8.1)

## 2019-02-23 LAB — CBC
HCT: 39 % (ref 39.0–52.0)
Hemoglobin: 12.3 g/dL — ABNORMAL LOW (ref 13.0–17.0)
MCH: 29.1 pg (ref 26.0–34.0)
MCHC: 31.5 g/dL (ref 30.0–36.0)
MCV: 92.4 fL (ref 80.0–100.0)
Platelets: 210 10*3/uL (ref 150–400)
RBC: 4.22 MIL/uL (ref 4.22–5.81)
RDW: 15.2 % (ref 11.5–15.5)
WBC: 9.3 10*3/uL (ref 4.0–10.5)
nRBC: 0 % (ref 0.0–0.2)

## 2019-02-23 MED ORDER — SODIUM CHLORIDE 0.9% FLUSH
10.0000 mL | Freq: Two times a day (BID) | INTRAVENOUS | Status: DC
  Administered 2019-02-23 – 2019-02-25 (×3): 10 mL

## 2019-02-23 MED ORDER — FUROSEMIDE 10 MG/ML IJ SOLN: 80.0000 mg | Freq: Every day | INTRAMUSCULAR | Status: DC

## 2019-02-23 MED ORDER — RAMELTEON 8 MG PO TABS
8.0000 mg | ORAL_TABLET | Freq: Every day | ORAL | Status: DC
  Administered 2019-02-23 – 2019-02-25 (×3): 8 mg via ORAL
  Filled 2019-02-23 (×4): qty 1

## 2019-02-23 MED ORDER — FUROSEMIDE 10 MG/ML IJ SOLN
10.0000 mg/h | INTRAVENOUS | Status: DC
  Administered 2019-02-23: 8 mg/h via INTRAVENOUS
  Administered 2019-02-24: 10 mg/h via INTRAVENOUS
  Filled 2019-02-23: qty 25
  Filled 2019-02-23: qty 21

## 2019-02-23 MED ORDER — FUROSEMIDE 10 MG/ML IJ SOLN
80.0000 mg | Freq: Once | INTRAMUSCULAR | Status: AC
  Administered 2019-02-23: 80 mg via INTRAVENOUS

## 2019-02-23 MED ORDER — MILRINONE LACTATE IN DEXTROSE 20-5 MG/100ML-% IV SOLN
0.2500 ug/kg/min | INTRAVENOUS | Status: DC
  Administered 2019-02-23 – 2019-02-25 (×4): 0.25 ug/kg/min via INTRAVENOUS
  Filled 2019-02-23 (×4): qty 100

## 2019-02-23 MED ORDER — SODIUM CHLORIDE 0.9% FLUSH: 10.0000 mL | INTRAVENOUS | Status: DC | PRN

## 2019-02-23 MED ORDER — FUROSEMIDE 10 MG/ML IJ SOLN
INTRAMUSCULAR | Status: AC
  Filled 2019-02-23: qty 8

## 2019-02-23 MED ORDER — CHLORHEXIDINE GLUCONATE CLOTH 2 % EX PADS
6.0000 | MEDICATED_PAD | Freq: Every day | CUTANEOUS | Status: DC
  Administered 2019-02-23 – 2019-02-25 (×3): 6 via TOPICAL

## 2019-02-23 MED ORDER — POTASSIUM CHLORIDE CRYS ER 20 MEQ PO TBCR
40.0000 meq | EXTENDED_RELEASE_TABLET | Freq: Two times a day (BID) | ORAL | Status: DC
  Administered 2019-02-23: 40 meq via ORAL
  Filled 2019-02-23: qty 2

## 2019-02-23 NOTE — Consult Note (Addendum)
Advanced Heart Failure Team Consult Note   Primary Physician: Lonie Peakonroy, Nathan, PA-C PCP-Cardiologist:  Jodelle RedBridgette Christopher, MD  Reason for Consultation: Cardiogenic Shock   HPI:    Chad Franklin is seen today for evaluation of cardiogenic shock  at the request of Dr Rennis GoldenHilty.   Chad Franklin is an 84 year old with a history of DM, COPD, CVA, permanent A fib, and chronic systolic heart failure. EF has been down since 2019. He has not had heart catherization. Had nuclear stress test in 2019 with EF 28% and no reversible ischemia, fixed defect inferolateral wall, and global HK at rest and with stress.   Over the last month he has had functional decline. He has had couple of falls at home. Last week he was seen by his PCP and he was volume overloaded so home diuretics increased (from 80 mg daily to 160 daily)  and toprol was increased to 50 mg daily. . Since that time his weight has gone up from 220--->240 pounds.   Admitted after fall at home, leg edema, and dyspnea with exertion. His daughter reports increased confusion at night. He does not recall falling or any associated dizziness. Pertinent admission labs included: BNP 1554, HS Trop 28>47>93, creatinine 1,2, and K 3.1. Covid 19  Negative. Started on diltiazem drip for uncontrolled A Fib. He has also been given IV lasix with poor response. Heart rate has slowed to the 60s on diltiazem drip  + 100 mg toprol. Over night he had increased confusion. Creatinine worsened today from 1.1>1.6. Given another 80 mg IV lasix with sluggish response. PICC line placed and showed CO-OX 36%. CVP 24.   Complaining of fatigue. Denies SOB.   Echo 02/22/19 LV EF 20-25% , RV severely enlarged. Moderate Chad. ECHO 03/2018 EF 20-25% mod Chad, Peak PA pressure 38 ECHO 2019 EF 35-40% RV moderately dilated, moderate Chad, Peak PA pressure 44.   Review of Systems: [y] = yes, [ ]  = no   . General: Weight gain [Y ]; Weight loss [ ] ; Anorexia [ ] ; Fatigue [Y ]; Fever [ ] ;  Chills [ ] ; Weakness [Y ]  . Cardiac: Chest pain/pressure [ ] ; Resting SOB [ ] ; Exertional SOB [Y ]; Orthopnea [ Y]; Pedal Edema [Y ]; Palpitations [ ] ; Syncope [ ] ; Presyncope [ ] ; Paroxysmal nocturnal dyspnea[ ]   . Pulmonary: Cough [ ] ; Wheezing[ ] ; Hemoptysis[ ] ; Sputum [ ] ; Snoring [ ]   . GI: Vomiting[ ] ; Dysphagia[ ] ; Melena[ ] ; Hematochezia [ ] ; Heartburn[ ] ; Abdominal pain [ ] ; Constipation [ ] ; Diarrhea [ ] ; BRBPR [ ]   . GU: Hematuria[ ] ; Dysuria [ ] ; Nocturia[ ]   . Vascular: Pain in legs with walking [ ] ; Pain in feet with lying flat [ ] ; Non-healing sores [ ] ; Stroke [Y ]; TIA [ ] ; Slurred speech [ ] ;  . Neuro: Headaches[ ] ; Vertigo[ ] ; Seizures[ ] ; Paresthesias[ ] ;Blurred vision [ ] ; Diplopia [ ] ; Vision changes [ ]   . Ortho/Skin: Arthritis [ ] ; Joint pain [Y ]; Muscle pain [ ] ; Joint swelling [ ] ; Back Pain [ ] ; Rash [ ]   . Psych: Depression[ ] ; Anxiety[ ]   . Heme: Bleeding problems [ ] ; Clotting disorders [ ] ; Anemia [ ]   . Endocrine: Diabetes [ Y]; Thyroid dysfunction[ ]   Home Medications Prior to Admission medications   Medication Sig Start Date End Date Taking? Authorizing Provider  acetaminophen (TYLENOL) 500 MG tablet Take 1 tablet (500 mg total) by mouth every 8 (eight) hours as  needed. Patient taking differently: Take 500 mg by mouth 2 (two) times daily.  12/04/17  Yes Love, Evlyn KannerPamela S, PA-C  calcium carbonate (TUMS - DOSED IN MG ELEMENTAL CALCIUM) 500 MG chewable tablet Chew 1 tablet by mouth 3 (three) times daily as needed for indigestion or heartburn.   Yes [provider]  ELIQUIS 5 MG TABS tablet TAKE 1 TABLET BY MOUTH TWICE DAILY Patient taking differently: Take 5 mg by mouth 2 (two) times daily.  07/25/18  Yes Jodelle Redhristopher, Bridgette, MD  furosemide (LASIX) 80 MG tablet Take 1 tablet (80 mg total) by mouth daily. 03/25/18  Yes Jodelle Redhristopher, Bridgette, MD  loratadine (CLARITIN) 10 MG tablet Take 10 mg by mouth daily as needed for allergies.   Yes [provider]  metoprolol succinate (TOPROL-XL) 50 MG 24 hr tablet Take 1 tablet (50 mg total) by mouth daily. Take with or immediately following a meal. Patient taking differently: Take 25 mg by mouth daily. Take with or immediately following a meal. 12/05/18 11/30/19 Yes Jodelle Redhristopher, Bridgette, MD  PARoxetine (PAXIL) 20 MG tablet Take 1 tablet (20 mg total) by mouth daily. Patient taking differently: Take 40 mg by mouth every morning.  12/04/17  Yes Love, Evlyn KannerPamela S, PA-C  potassium chloride SA (K-DUR,KLOR-CON) 20 MEQ tablet Take 1 tablet (20 mEq total) by mouth daily. 03/25/18  Yes Jodelle Redhristopher, Bridgette, MD  pravastatin (PRAVACHOL) 10 MG tablet Take 1 tablet (10 mg total) by mouth daily. 03/25/18  Yes Jodelle Redhristopher, Bridgette, MD  methylphenidate (RITALIN) 10 MG tablet Take 0.5 tablets (5 mg total) by mouth 2 (two) times daily with breakfast and lunch. Patient not taking: Reported on 02/21/2019 01/16/19   Marcello FennelPatel, Ankit Anil, MD    Past Medical History: Past Medical History:  Diagnosis Date  . Bilateral swelling of feet   . COPD (chronic obstructive pulmonary disease) (HCC)   . Depression   . Diabetes mellitus   . GERD (gastroesophageal reflux disease)   . Palpitations   . Stroke [facility](HCC)    2    Past Surgical History: Past Surgical History:  Procedure Laterality Date  . GALLBLADDER SURGERY    . HERNIA REPAIR      Family History: Family History  Problem Relation Age of Onset  . Hypertension Father   . Stroke Sister     Social History: Social History   Socioeconomic History  . Marital status: Married    Spouse name: Not on file  . Number of children: Not on file  . Years of education: Not on file  . Highest education level: Not on file  Occupational History  . Not on file  Social Needs  . Financial resource strain: Not on file  . Food insecurity    Worry: Not on file    Inability: Not on file  . Transportation needs    Medical: Not on file    Non-medical: Not on file  Tobacco Use  .  Smoking status: Former Games developermoker  . Smokeless tobacco: Never Used  Substance and Sexual Activity  . Alcohol use: No    Comment: past drinker  . Drug use: Not Currently  . Sexual activity: Not on file  Lifestyle  . Physical activity    Days per week: Not on file    Minutes per session: Not on file  . Stress: Not on file  Relationships  . Social Musicianconnections    Talks on phone: Not on file    Gets together: Not on file    Attends  religious service: Not on file    Active member of club or organization: Not on file    Attends meetings of clubs or organizations: Not on file    Relationship status: Not on file  Other Topics Concern  . Not on file  Social History Narrative  . Not on file    Allergies:  Allergies  Allergen Reactions  . Naproxen Sodium Anaphylaxis, Hives, Shortness Of Breath and Swelling    Objective:    Vital Signs:   Temp:  [97.5 F (36.4 C)-98.4 F (36.9 C)] 98.4 F (36.9 C) (12/07 1239) Pulse Rate:  [69-86] 70 (12/07 1239) Resp:  [20] 20 (12/07 1239) BP: (95-120)/(61-91) 107/71 (12/07 1239) SpO2:  [83 %-96 %] 96 % (12/07 1239) Weight:  [94.1 kg] 94.1 kg (12/07 0658) Last BM Date: 02/21/19  Weight change: Filed Weights   02/21/19 1956 02/22/19 0526 02/23/19 0658  Weight: 99.8 kg 94.3 kg 94.1 kg    Intake/Output:   Intake/Output Summary (Last 24 hours) at 02/23/2019 1527 Last data filed at 02/23/2019 1400 Gross per 24 hour  Intake 1755.11 ml  Output 800 ml  Net 955.11 ml      Physical Exam   CVP 24 General:  Appears chronically ill..  No resp difficulty HEENT: normal Neck: supple. JVP to jaw . Carotids 2+ bilat; no bruits. No lymphadenopathy or thyromegaly appreciated. Cor: PMI nondisplaced.  Irregular rate & rhythm. No rubs, gallops or murmurs. Lungs: clear Abdomen: soft, nontender, nondistended. No hepatosplenomegaly. No bruits or masses. Good bowel sounds. Extremities: cool, RLE/LLE cyanosis, clubbing, rash, R and LLE 1+ edema. LUE weakness  due to stroke.  Neuro: alert & orientedx3, cranial nerves grossly intact. moves all 4 extremities w/o difficulty. Affect pleasant   Telemetry    A fib 60s personally reviewed  EKG   EKG on admit A fib RVR 143 bpm   Labs   Basic Metabolic Panel: Recent Labs  Lab 02/16/19 1727 02/21/19 2003 02/22/19 0321 02/23/19 0728  NA 141 139 140 140  K 3.5 3.1* 4.7 3.8  CL 102 99 102 101  CO2 29 30 28 26   GLUCOSE 104* 163* 126* 136*  BUN 20 29* 25* 32*  CREATININE 1.17 1.20 1.13 1.62*  CALCIUM 8.7* 8.3* 8.0* 8.4*  MG  --  1.9 1.9  --     Liver Function Tests: Recent Labs  Lab 02/21/19 07-15-01 02/22/19 0321 02/23/19 0728  AST 36 54* 61*  58*  ALT 25 20 35  32  ALKPHOS 88 70 67  67  BILITOT 1.0 1.3* 1.8*  1.4*  PROT 7.4 6.9 6.5  6.4*  ALBUMIN 3.2* 3.0* 2.8*  2.8*   No results for input(s): LIPASE, AMYLASE in the last 168 hours. No results for input(s): AMMONIA in the last 168 hours.  CBC: Recent Labs  Lab 02/16/19 1727 02/21/19 2003 02/22/19 0321 02/23/19 0728  WBC 6.5 6.5 9.1 9.3  HGB 12.9* 13.3 12.6* 12.3*  HCT 42.6 43.5 41.1 39.0  MCV 95.5 95.2 94.5 92.4  PLT 266 221 220 210    Cardiac Enzymes: No results for input(s): CKTOTAL, CKMB, CKMBINDEX, TROPONINI in the last 168 hours.  BNP: BNP (last 3 results) Recent Labs    02/21/19 2001/07/15  BNP 1,554.5*    ProBNP (last 3 results) No results for input(s): PROBNP in the last 8760 hours.   CBG: No results for input(s): GLUCAP in the last 168 hours.  Coagulation Studies: Recent Labs    02/21/19 07/15/2001  LABPROT 23.1*  INR 2.1*     Imaging   Dg Chest Port 1 View  Result Date: 02/23/2019 CLINICAL DATA:  PICC line placement EXAM: PORTABLE CHEST 1 VIEW COMPARISON:  Radiograph 02/21/2019 FINDINGS: Right upper extremity PICC terminates at the mid SVC. Cardiomegaly is similar to prior. Volumes are diminished. Accentuation of the chronic bronchitic and interstitial changes seen on comparison. No new  consolidative opacity. Elevation of the right hemidiaphragm with some adjacent linear atelectasis in the right lung base. No pneumothorax. No visible effusion. Diffusely demineralized osseous structures. No acute osseous or soft tissue abnormality. IMPRESSION: 1. Right upper extremity PICC terminates at the mid SVC. 2. No pneumothorax. 3. Stable cardiomegaly. 4. Low lung volumes with chronic bronchitic and interstitial changes. 5. Elevation of the right hemidiaphragm with some adjacent linear atelectasis. Electronically Signed   By: Kreg Shropshire M.D.   On: 02/23/2019 14:42   [place] Ekg Site Rite  Result Date: 02/23/2019 If Site Rite image not attached, placement could not be confirmed due to current cardiac rhythm.     Medications:     Current Medications: . alum & mag hydroxide-simeth  30 mL Oral Once  . apixaban  5 mg Oral BID  . Chlorhexidine Gluconate Cloth  6 each Topical Daily  . [START ON 02/24/2019] furosemide  80 mg Intravenous Daily  . losartan  25 mg Oral Daily  . metoprolol succinate  100 mg Oral Daily  . PARoxetine  20 mg Oral Daily  . pravastatin  10 mg Oral Daily  . ramelteon  8 mg Oral QHS  . sodium chloride flush  10-40 mL Intracatheter Q12H  . spironolactone  12.5 mg Oral Daily     Infusions: . diltiazem (CARDIZEM) infusion 15 mg/hr (02/23/19 1215)       Assessment/Plan   1. Cardiogenic Shock Biventricular HF  ECHO EF 20% Severely reduced LV/RV function.  -Appears cold and wet.  - Mixed venous saturation 36%. Worsening renal function. Creatinine trending up 1.1>1.6 .   -Sluggish response to 80 mg IV lasix.  -Stop losartan. Stop Toprol XL. Stop spiro. Due to shock and AKI.  -Start milrinone 0.25 mcg. Check CO-OX in 2 hours.  May need to add norepi if BP drops.  - Give 80 mg IV lasix now and start lasix drip at 8 mg per hour. Give 40 meq K now and twice a daily.  - Not a candidate for LVAD. Age/RV failure.    2. Chronic  A Fib Stop diltiazem with reduced EF.  Can use Amiodarone for rate control if needed.  Continue eliquis 5 mg twice a day. May need to reduce dose with worsening serum creatininine.   3.Elevated HS Troponin -28>47>93 -Suspect related to demand ischemia   4. H/O CVA  LUE chronic weakness. On statin + elquis.   5. Falls  Will need PT.   Consult Palliative Care for GOC.   Length of Stay: 1  Amy Clegg, NP  02/23/2019, 3:27 PM  Advanced Heart Failure Team Pager [phone] (M-F; 7a - 4p)  Please contact [facility] for night-coverage after hours (4p -7a ) and weekends on [email or web address]  Agree with above.  84 y/o male with permanent AF, chronic systolic HF due to presumed NICM (myvoiew 2019 - no cath)., previous CVA with left sided weakness.   Admitted with 1 month of worsening HF and volume overload. Creatinine worsening with attempts at IV diuresis. Echo (Personally reviewed) LVEF 20-25% with severe RV HK. PICC placed and co-ox 36% with CVP 24 c/w  cardiogenic shock.   General:  Elderly weak appearing. No resp difficulty HEENT: normal Neck: supple. JVP to ear Carotids 2+ bilat; no bruits. No lymphadenopathy or thryomegaly appreciated. Cor: PMI nondisplaced. Irregular rate & rhythm. + s3 Lungs: clear decreased at bases Abdomen: soft, nontender, + distended. No hepatosplenomegaly. No bruits or masses. Good bowel sounds. Extremities: no cyanosis, clubbing, rash, 2+ edema Neuro: alert & orientedx3, cranial nerves grossly intact. LUE weak  W/u c/w progressive HF now with severe biventricular failure and shock. Will start milrinone and continue IV diuresis. Unfortunately, given his age, severe biventricular failure and progressive functional decline I suspect we may not have any durable support options for him. We discussed with him and his daughter. They are open to Palliative Care discussions.   CRITICAL CARE Performed by: Glori Bickers  Total critical care time: 45 minutes  Critical care time was exclusive of  separately billable procedures and treating other patients.  Critical care was necessary to treat or prevent imminent or life-threatening deterioration.  Critical care was time spent personally by me (independent of midlevel providers or residents) on the following activities: development of treatment plan with patient and/or surrogate as well as nursing, discussions with consultants, evaluation of patient's response to treatment, examination of patient, obtaining history from patient or surrogate, ordering and performing treatments and interventions, ordering and review of laboratory studies, ordering and review of radiographic studies, pulse oximetry and re-evaluation of patient's condition.  Glori Bickers, MD  7:44 PM

## 2019-02-23 NOTE — Progress Notes (Signed)
Peripherally Inserted Central Catheter/Midline Placement  The IV Nurse has discussed with the patient and/or persons authorized to consent for the patient, the purpose of this procedure and the potential benefits and risks involved with this procedure.  The benefits include less needle sticks, lab draws from the catheter, and the patient may be discharged home with the catheter. Risks include, but not limited to, infection, bleeding, blood clot (thrombus formation), and puncture of an artery; nerve damage and irregular heartbeat and possibility to perform a PICC exchange if needed/ordered by physician.  Alternatives to this procedure were also discussed.  Bard Power PICC patient education guide, fact sheet on infection prevention and patient information card has been provided to patient /or left at bedside.   Consent obtained with daughter at bedside   PICC/Midline Placement Documentation  PICC Double Lumen 15/94/58 PICC Right Basilic 42 cm (Active)  Indication for Insertion or Continuance of Line Chronic illness with exacerbations (CF, Sickle Cell, etc.) 02/23/19 1400  Exposed Catheter (cm) 0 cm 02/23/19 1400  Site Assessment Clean;Dry;Intact 02/23/19 1400  Lumen #1 Status Flushed;Saline locked;Blood return noted 02/23/19 1400  Lumen #2 Status Flushed;Saline locked;Blood return noted 02/23/19 1400  Dressing Type Transparent;Securing device 02/23/19 1400  Dressing Status Clean;Dry;Intact;Antimicrobial disc in place 02/23/19 1400  Dressing Change Due 03/02/19 02/23/19 1400       Holley Bouche [place] 02/23/2019, 2:20 PM

## 2019-02-23 NOTE — Progress Notes (Signed)
Paged by nurse stating that the patient is agitated pulling at iv lines, trying to get out of bed, and yelling out for people that are not present in room. According to patient's daughter the patient does this at home nightly recently. Prior to admission the patient was wandering around in the middle of night and was trying to leave during which time he fell at his house.   Patient was evaluated at bedside at which time he was fidgeting and picking at his gown and cardiac monitoring. He was alert to self, time, and place of residence. He interacted well with physicians.   Patient likely has acute delirium/sundowning.  -Apply mittens to bilateral hands to prevent picking at medical devices  -ramelteon 8mg  qhs  -frequent reorientation q 4hrs  -delirium precautions  -Day team to speak with patient's daughter in AM regarding details of agitation and confusion.    Lars Mage, MD Internal Medicine PGY3 Pager:[phone] 02/23/2019, 1:24 AM

## 2019-02-23 NOTE — Progress Notes (Signed)
   D/w Dr. Marianna Payment (housestaff) - bladder scan showed minimal retained urine. Concern for cardiorenal syndrome given low EF, volume overload and declining renal function. Would recommend placing PICC line per IV team today and check Co-ox - may be a candidate for inotropes and advanced CHF service consultation. Mental status changes may be in part due to cardiogenic shock.  Pixie Casino, MD, [facility], [place] Director of the Advanced Lipid Disorders &  Cardiovascular Risk Reduction Clinic Diplomate of the American Board of Clinical Lipidology Attending Cardiologist  Direct Dial: [phone]  Fax: [phone]  Website:  [email or web address]

## 2019-02-23 NOTE — Plan of Care (Signed)

## 2019-02-23 NOTE — Progress Notes (Signed)
Subjective:   Patient was seen this morning on rounds and is resting comfortably in his bed. Overnight, the patient had an episode of agitation attributed to delirium/sundowin Patient required frequent reorientation in order to prevent him from getting up and pulling out his IVs.  On evaluation today, the patient continued to be mildly altered from baseline but is alert and oriented to self. He denies any new symptoms at this time.   Objective:  Vital signs in last 24 hours: Vitals:   02/22/19 0817 02/22/19 1258 02/22/19 2041 02/23/19 0042  BP: (!) 154/130 124/82 99/63 100/72  Pulse: 92 97 86 78  Resp: 18 18 20    Temp: 97.6 F (36.4 C) 97.8 F (36.6 C) 98.1 F (36.7 C)   TempSrc: Oral Oral Oral   SpO2: 96% (!) 89% (!) 83% 90%  Weight:      Height:        Physical Exam:  Physical Exam  HENT:  Head: Normocephalic and atraumatic.  Eyes: EOM are normal.  Neck: Normal range of motion.  Cardiovascular: Normal rate, regular rhythm, normal heart sounds and intact distal pulses. Exam reveals no gallop and no friction rub.  No murmur heard. Pulmonary/Chest: Effort normal and breath sounds normal. No respiratory distress. He exhibits no tenderness.  Abdominal: Soft. Bowel sounds are normal. He exhibits distension. He exhibits no mass. There is no abdominal tenderness. There is no guarding.  Musculoskeletal: Normal range of motion.        General: Edema (Una boot on, but has 1-2+ at knees) present. No tenderness.  Neurological: He is alert.  Skin: Skin is warm and dry.       Pertinent labs/Imaging: CBC Latest Ref Rng & Units 02/22/2019 02/21/2019 02/16/2019  WBC 4.0 - 10.5 K/uL 9.1 6.5 6.5  Hemoglobin 13.0 - 17.0 g/dL 12.6(L) 13.3 12.9(L)  Hematocrit 39.0 - 52.0 % 41.1 43.5 42.6  Platelets 150 - 400 K/uL 220 221 266    CMP Latest Ref Rng & Units 02/22/2019 02/21/2019 02/16/2019  Glucose 70 - 99 mg/dL 02/18/2019) 854(O) 270(J)  BUN 8 - 23 mg/dL 500(X) 38(H) 20  Creatinine 0.61 -  1.24 mg/dL 82(X 9.37 1.69  Sodium 135 - 145 mmol/L 140 139 141  Potassium 3.5 - 5.1 mmol/L 4.7 3.1(L) 3.5  Chloride 98 - 111 mmol/L 102 99 102  CO2 22 - 32 mmol/L 28 30 29   Calcium 8.9 - 10.3 mg/dL 8.0(L) 8.3(L) 8.7(L)  Total Protein 6.5 - 8.1 g/dL 6.9 7.4 -  Total Bilirubin 0.3 - 1.2 mg/dL 6.78) 1.0 -  Alkaline Phos 38 - 126 U/L 70 88 -  AST 15 - 41 U/L 54(H) 36 -  ALT 0 - 44 U/L 20 25 -    Echo: 1. Left ventricular ejection fraction, by visual estimation, is 20 to 25%. The left ventricle has severely decreased function. There is no left ventricular hypertrophy.  2. Left ventricular diastolic function could not be evaluated.  3. Right ventricular volume and pressure overload.  4. Global right ventricle has severely reduced systolic function.The right ventricular size is severely enlarged. No increase in right ventricular wall thickness.  5. Left atrial size was severely dilated.  6. Right atrial size was severely dilated.  7. The mitral valve is normal in structure. Moderate mitral valve regurgitation. No evidence of mitral stenosis.  8. The tricuspid valve is normal in structure. Tricuspid valve regurgitation moderate.  9. The aortic valve is normal in structure. Aortic valve regurgitation is not visualized. No  evidence of aortic valve sclerosis or stenosis. 10. The pulmonic valve was normal in structure. Pulmonic valve regurgitation is mild. 11. Moderately elevated pulmonary artery systolic pressure. 12. The tricuspid regurgitant velocity is 2.84 m/s, and with an assumed right atrial pressure of 15 mmHg, the estimated right ventricular systolic pressure is moderately elevated at 47.3 mmHg. 13. The inferior vena cava is dilated in size with <50% respiratory variability, suggesting right atrial pressure of 15 mmHg.   Assessment/Plan:  Active Problems:   Atrial fibrillation with RVR (HCC)   Acute exacerbation of CHF (congestive heart failure) ([place])   Fall    Patient Summary:  Chad Franklin is an 84 year old male with a pertinent past medical history of embolic CVA (residual left-sided weakness), A. fib (Eliquis), HFrEF (25-30%), hypertension, who presented with acute on chronic CHF secondary to A. fib with RVR.  #Acute on chronic systolic heart failure secondary to A. fib with RVR - patient has had minimal/poor urine output despite IV Lasix with worsening of kidney function likely secondary to cardiorenal syndrome. - Echo shows EF of 20-25%, no significant change since prior echo - Cardiology plan to place central line with Rt heart cath. - Spoke to family about patients progressed and discussed palliative care. Patient's daughter agrees to consult.  - Patient may benefit from Milrinone despite having history of ischemic heart disease on previous cath. - Patient is still volume overloaded. Was started on Lasix 120 IV with net output of 220.6 this morning. - Will continue 80 mg BID Lasix per cardiology  - Strict I/Os, daily weights  #A. fib with RVR - Metoprolol and Diltizem held this morning due to BP of 90's/60's with 60's HR.  - Metoprolol increased yesterday to 100 mg daily per cardiology - Continue Eliquis  - Appreciate cardiologies recommendations.   #Delirium 2/2 to low output heart failure - patient's acute mental status change could be secondary to low output heart failure.  Patient's showing signs of kidney dysfunction and elevation AST concerning for liver involvement..   Diet: Heart Healthy IVF: none VTE: Eliquis Code: DNI  Dispo: Anticipated discharge pending clinical improvement.   Marianna Payment, MD 02/23/2019, 5:37 AM Pager: [phone]

## 2019-02-23 NOTE — Progress Notes (Signed)
Progress Note  Patient Name: Chad Franklin Date of Encounter: 02/23/2019  Primary Cardiologist: Jodelle Red, MD   Subjective   Agitated overnight and confused today - oriented to self. Only recorded -280 cc (weight is stable). Creatinine jumped to 1.62 overnight. As mentioned, echo shows decline in LVEF to 20-25%. He has a condom cath in place - would consider bladder scan today.  Inpatient Medications    Scheduled Meds: . alum & mag hydroxide-simeth  30 mL Oral Once  . apixaban  5 mg Oral BID  . furosemide  80 mg Intravenous BID  . losartan  25 mg Oral Daily  . metoprolol succinate  100 mg Oral Daily  . PARoxetine  20 mg Oral Daily  . pravastatin  10 mg Oral Daily  . ramelteon  8 mg Oral QHS  . spironolactone  12.5 mg Oral Daily   Continuous Infusions: . diltiazem (CARDIZEM) infusion 15 mg/hr (02/23/19 0239)   PRN Meds: acetaminophen **OR** acetaminophen   Vital Signs    Vitals:   02/22/19 2041 02/23/19 0042 02/23/19 0613 02/23/19 0658  BP: 99/63 100/72 (!) 120/91   Pulse: 86 78 72   Resp: 20     Temp: 98.1 F (36.7 C)     TempSrc: Oral     SpO2: (!) 83% 90% 94%   Weight:    94.1 kg  Height:        Intake/Output Summary (Last 24 hours) at 02/23/2019 0852 Last data filed at 02/23/2019 0400 Gross per 24 hour  Intake 969.35 ml  Output 800 ml  Net 169.35 ml   Last 3 Weights 02/23/2019 02/22/2019 02/21/2019  Weight (lbs) 207 lb 7.3 oz 208 lb 220 lb  Weight (kg) 94.1 kg 94.348 kg 99.791 kg      Telemetry    afib with CVR in the 80's - Personally Reviewed  ECG    N/A  Physical Exam   General appearance: delirious, no distress and moderately obese Neck: JVD - 3 cm above sternal notch, no carotid bruit and thyroid not enlarged, symmetric, no tenderness/mass/nodules Lungs: diminished breath sounds bibasilar Heart: irregularly irregular rhythm Abdomen: soft, non-tender; bowel sounds normal; no masses,  no organomegaly and obese Extremities:  edema 1+ edema / wrapped Pulses: 2+ and symmetric Skin: Skin color, texture, turgor normal. No rashes or lesions Neurologic: Mental status: arousable, confused and only oriented to self Psych: Delerium   Labs    High Sensitivity Troponin:   Recent Labs  Lab 02/21/19 2003 02/21/19 2256 02/22/19 0321  TROPONINIHS 28* 47* 93*      Chemistry Recent Labs  Lab 02/21/19 2003 02/22/19 0321 02/23/19 0728  NA 139 140 140  K 3.1* 4.7 3.8  CL 99 102 101  CO2 30 28 26   GLUCOSE 163* 126* 136*  BUN 29* 25* 32*  CREATININE 1.20 1.13 1.62*  CALCIUM 8.3* 8.0* 8.4*  PROT 7.4 6.9 6.4*  ALBUMIN 3.2* 3.0* 2.8*  AST 36 54* 58*  ALT 25 20 32  ALKPHOS 88 70 67  BILITOT 1.0 1.3* 1.4*  GFRNONAA 56* >60 39*  GFRAA >60 >60 45*  ANIONGAP 10 10 13      Hematology Recent Labs  Lab 02/21/19 2003 02/22/19 0321 02/23/19 0728  WBC 6.5 9.1 9.3  RBC 4.57 4.35 4.22  HGB 13.3 12.6* 12.3*  HCT 43.5 41.1 39.0  MCV 95.2 94.5 92.4  MCH 29.1 29.0 29.1  MCHC 30.6 30.7 31.5  RDW 14.9 14.8 15.2  PLT 221 220 210  BNP Recent Labs  Lab 02/21/19 2003  BNP 1,554.5*     DDimer No results for input(s): DDIMER in the last 168 hours.   Radiology    Dg Chest 2 View  Result Date: 02/21/2019 CLINICAL DATA:  Tripped and fell in bathroom, struck head, chest pain, diabetes mellitus, COPD, GERD, hypertension EXAM: CHEST - 2 VIEW COMPARISON:  11/19/2017 FINDINGS: Enlargement of cardiac silhouette. Mediastinal contours and pulmonary vascularity normal. Minimal chronic interstitial prominence and chronic bronchitic changes, stable. No acute infiltrate, pleural effusion, or pneumothorax. Diffuse osseous demineralization. Atherosclerotic calcification aorta. IMPRESSION: Enlargement of cardiac silhouette. Minimal chronic bronchitic and interstitial changes. No acute abnormalities. Electronically Signed   By: Lavonia Dana M.D.   On: 02/21/2019 20:25   Ct Head Wo Contrast  Result Date: 02/21/2019 CLINICAL DATA:   Head trauma after a fall EXAM: CT HEAD WITHOUT CONTRAST TECHNIQUE: Contiguous axial images were obtained from the base of the skull through the vertex without intravenous contrast. COMPARISON:  November 15, 2017 FINDINGS: Brain: No evidence of acute territorial infarction, hemorrhage, hydrocephalus,extra-axial collection or mass lesion/mass effect. There is dilatation the ventricles and sulci consistent with age-related atrophy. Low-attenuation changes in the deep white matter consistent with small vessel ischemia. Areas of encephalomalacia involving the left parietal lobe. There is lacunar infarct involving the right internal capsule and right corona radiata. Vascular: No hyperdense vessel or unexpected calcification. Skull: The skull is intact. No fracture or focal lesion identified. Sinuses/Orbits: The visualized paranasal sinuses and mastoid air cells are clear. The orbits and globes intact. Other: None IMPRESSION: No acute intracranial abnormality. Findings consistent with age related atrophy and chronic small vessel ischemia Encephalomalacia in the left parietal lobe, lacunar infarcts in the right internal capsule and corona radiata. Electronically Signed   By: Prudencio Pair M.D.   On: 02/21/2019 21:31    Cardiac Studies   Echocardiogram 03/25/2018: Study Conclusions: - Left ventricle: The cavity size was normal. Wall thickness was   increased in a pattern of mild LVH. Systolic function was   severely reduced. The estimated ejection fraction was in the   range of 25% to 30%. Diffuse hypokinesis. The study is not   technically sufficient to allow evaluation of LV diastolic   function. - Aortic valve: There was trivial regurgitation. - Mitral valve: There was moderate regurgitation. - Right ventricle: Systolic function was mildly reduced. - Tricuspid valve: There was mild regurgitation. - Pulmonary arteries: Systolic pressure was mildly increased. PA   peak pressure: 38 mm Hg (S).  Impressions:  - Compared to the prior study, there has been no significant   interval change.  Patient Profile     84 y.o. male with a history of chronic systolic CHF with EF of 24-26% on Echo in 03/2018, permanent atrial fibrillation on Eliquis, CVA, diabetes mellitus, COPD, who presented after a fall at home. Also reported recent shortness of breath, orthopnea, and edema. Found to be in atrial fibrillation with RVR and decompensated CHF on presentation.   Assessment & Plan    Acute on Chronic Systolic CHF - BNP elevated in the 1550s.  - Chest x-ray showed no overt edema. - Most recent Echo from 03/2018 showed LVEF of 20-25% with diffuse hypokinesis.  - Lasix 80 mg IV BID - little urine output - Creatinine jumped overnight with little urine output - Continue Toprol-XL.  - Patient has been unable to tolarate ACEI/ARB in the past due to hypotension.  - Monitor daily weights, strict I/Os, and renal function. - Plans  for possible R/LHC, however, would be pending renal recovery  Atrial Fibrillation with RVR - Patient has history of permanent atrial fibrillation. Rates currently in 90's to 120's.  - Continue IV Cardizem drip for now given poor po intake due to encephalopathy. - Toprol-XL100mg  po daily. - Continue chronic anticoagulation with Eliquis 5mg  twice daily.  Elevated Troponin - High-sensitivity troponin elevated 28 >> 47 >> 93, most consistent with demand ischemia with CHF. - No acute EKG changes.  - Suspect demand ischemia secondary to acute CHF and atrial fibrillation with RVR.  AKI - Creatinine increased to 1.62 from 1.13 overnight - urine output has declined. Weight stable. Check  Bladder scan for retention - if volume is low, would further decrease lasix to 80 mg IV daily.   For questions or updates, please contact [facility] Please consult [email or web address] for contact info under   Chrystie Nose, MD, Milagros Loll  Gisela  [facility]  Medical Director of the [facility] Diplomate of the American Board of Clinical Lipidology Attending Cardiologist  Direct Dial: [phone]  Fax: [phone]  Website:  [email or web address]  Chrystie Nose, MD  02/23/2019, 8:52 AM

## 2019-02-24 ENCOUNTER — Ambulatory Visit: Payer: Medicare HMO | Admitting: Cardiology

## 2019-02-24 DIAGNOSIS — R570 Cardiogenic shock: Secondary | ICD-10-CM | POA: Diagnosis not present

## 2019-02-24 LAB — COOXEMETRY PANEL
Carboxyhemoglobin: 1.6 % — ABNORMAL HIGH (ref 0.5–1.5)
Methemoglobin: 1.5 % (ref 0.0–1.5)
O2 Saturation: 76.7 %
Total hemoglobin: 11.5 g/dL — ABNORMAL LOW (ref 12.0–16.0)

## 2019-02-24 LAB — BASIC METABOLIC PANEL
Anion gap: 12 (ref 5–15)
BUN: 39 mg/dL — ABNORMAL HIGH (ref 8–23)
CO2: 28 mmol/L (ref 22–32)
Calcium: 8.2 mg/dL — ABNORMAL LOW (ref 8.9–10.3)
Chloride: 98 mmol/L (ref 98–111)
Creatinine, Ser: 1.67 mg/dL — ABNORMAL HIGH (ref 0.61–1.24)
GFR calc Af Amer: 44 mL/min — ABNORMAL LOW (ref >60)
GFR calc non Af Amer: 38 mL/min — ABNORMAL LOW (ref >60)
Glucose, Bld: 115 mg/dL — ABNORMAL HIGH (ref 70–99)
Potassium: 3.2 mmol/L — ABNORMAL LOW (ref 3.5–5.1)
Sodium: 138 mmol/L (ref 135–145)

## 2019-02-24 MED ORDER — POTASSIUM CHLORIDE CRYS ER 20 MEQ PO TBCR
40.0000 meq | EXTENDED_RELEASE_TABLET | ORAL | Status: AC
  Administered 2019-02-24: 40 meq via ORAL
  Filled 2019-02-24: qty 2

## 2019-02-24 MED ORDER — APIXABAN 2.5 MG PO TABS
2.5000 mg | ORAL_TABLET | Freq: Two times a day (BID) | ORAL | Status: DC
  Administered 2019-02-24 – 2019-02-25 (×2): 2.5 mg via ORAL
  Filled 2019-02-24 (×2): qty 1

## 2019-02-24 MED ORDER — METOLAZONE 2.5 MG PO TABS
2.5000 mg | ORAL_TABLET | Freq: Once | ORAL | Status: AC
  Administered 2019-02-24: 2.5 mg via ORAL
  Filled 2019-02-24: qty 1

## 2019-02-24 MED ORDER — POTASSIUM CHLORIDE CRYS ER 20 MEQ PO TBCR
40.0000 meq | EXTENDED_RELEASE_TABLET | Freq: Two times a day (BID) | ORAL | Status: DC
  Administered 2019-02-24 – 2019-02-25 (×4): 40 meq via ORAL
  Filled 2019-02-24 (×5): qty 2

## 2019-02-24 NOTE — Progress Notes (Signed)
Physical Therapy Treatment Patient Details Name: Chad Franklin MRN: [id number] DOB: 02-26-1935 Today's Date: 02/24/2019    History of Present Illness 84yo male with PMH embolic CVA with residual left sided weakness, afib on eliquis, HFrEF (EF 25-30%), COPD, and HTN who presented after a fall at home along with worsening shortness of breath over the past month. His fall was witnessed by his wife when he fell backward and hit his head. He did not lose consciousness. Pt with CHF exacerbation, afib with RVR, and COPD.    PT Comments    Patient seen for mobility progression. Pt presents with impaired balance and cognitive deficits increasing risk of falls. Pt requires mod-mod A +2 for OOB mobility using RW. Limited use of L hand on RW due to wound on L palm from previous fall at home. Pt tolerated increased gait distance this session with chair follow for safety. Current plan remains appropriate.    Follow Up Recommendations  CIR;Supervision/Assistance - 24 hour     Equipment Recommendations  Other (comment)(defer to post-acute setting)    Recommendations for Other Services Rehab consult     Precautions / Restrictions Precautions Precautions: Fall Precaution Comments: falls in the past few months    Mobility  Bed Mobility Overal bed mobility: Needs Assistance Bed Mobility: Supine to Sit     Supine to sit: Min assist     General bed mobility comments: assist to elevate trunk into sitting  Transfers Overall transfer level: Needs assistance Equipment used: Rolling walker (2 wheeled) Transfers: Sit to/from Stand Sit to Stand: Mod assist         General transfer comment: cues for safe hand placement; assistance to power up into standing and to gain balance upon standing  Ambulation/Gait Ambulation/Gait assistance: Mod assist;+2 safety/equipment Gait Distance (Feet): 70 Feet Assistive device: Rolling walker (2 wheeled) Gait Pattern/deviations: Leaning  posteriorly;Staggering left;Staggering right;Step-through pattern;Decreased step length - right;Decreased step length - left;Shuffle;Scissoring Gait velocity: decreased   General Gait Details: assistance required for balance and managing RW safely; pt with multiple LOB and increased assistance required when turning as pt tends to have scissoring gait    Stairs             Wheelchair Mobility    Modified Rankin (Stroke Patients Only)       Balance Overall balance assessment: Needs assistance Sitting-balance support: Single extremity supported;Feet supported Sitting balance-Leahy Scale: Good     Standing balance support: Bilateral upper extremity supported Standing balance-Leahy Scale: Poor                              Cognition Arousal/Alertness: Awake/alert Behavior During Therapy: WFL for tasks assessed/performed Overall Cognitive Status: No family/caregiver present to determine baseline cognitive functioning Area of Impairment: Safety/judgement;Problem solving                         Safety/Judgement: Decreased awareness of safety;Decreased awareness of deficits   Problem Solving: Requires verbal cues;Difficulty sequencing        Exercises      General Comments General comments (skin integrity, edema, etc.): unable to get SpO2 reading due to poor waveform so pt left on 2L O2 via [place] during session      Pertinent Vitals/Pain Pain Assessment: Faces Faces Pain Scale: Hurts little more Pain Location: L hand (wound of palm of hand from previous fall) Pain Descriptors / Indicators: Guarding;Sore Pain Intervention(s): Limited activity  within patient's tolerance;Monitored during session;Repositioned    Home Living                      Prior Function            PT Goals (current goals can now be found in the care plan section) Progress towards PT goals: Progressing toward goals    Frequency    Min 3X/week      PT Plan  Current plan remains appropriate    Co-evaluation PT/OT/SLP Co-Evaluation/Treatment: Yes Reason for Co-Treatment: For patient/therapist safety;To address functional/ADL transfers PT goals addressed during session: Mobility/safety with mobility;Balance        AM-PAC PT "6 Clicks" Mobility   Outcome Measure  Help needed turning from your back to your side while in a flat bed without using bedrails?: None Help needed moving from lying on your back to sitting on the side of a flat bed without using bedrails?: A Little Help needed moving to and from a bed to a chair (including a wheelchair)?: A Lot Help needed standing up from a chair using your arms (e.g., wheelchair or bedside chair)?: A Lot Help needed to walk in hospital room?: A Lot Help needed climbing 3-5 steps with a railing? : Total 6 Click Score: 14    End of Session Equipment Utilized During Treatment: Gait belt Activity Tolerance: Patient tolerated treatment well Patient left: in chair;with call bell/phone within reach;with chair alarm set Nurse Communication: Mobility status PT Visit Diagnosis: Unsteadiness on feet (R26.81);Other abnormalities of gait and mobility (R26.89);Muscle weakness (generalized) (M62.81);Ataxic gait (R26.0)     Time: 5465-6812 PT Time Calculation (min) (ACUTE ONLY): 24 min  Charges:  $Gait Training: 8-22 mins                     Erline Levine, PTA Acute Rehabilitation Services Pager: [phone] Office: [phone]     Carolynne Edouard 02/24/2019, 2:29 PM

## 2019-02-24 NOTE — Plan of Care (Signed)

## 2019-02-24 NOTE — Progress Notes (Addendum)
Inpatient Rehab Admissions:  Inpatient Rehab Consult received.  I met with patient at the bedside for rehabilitation assessment and to discuss goals and expectations of an inpatient rehab admission.  He appears to appropriately answer questions and is accurate with biographical info.  He is open to CIR and asked that I call his daughter, Hassan Rowan, to discuss.  I left her a voicemail and asked her to return my call.  Note pt still requiring several cardiac drips and not medically ready for transition to CIR.  Also note possible plans for Palliative Care consult.   Will continue to follow and open insurance for authorization when pt more medically stable.  Addendum: I was able to speak to [place].  She is hopeful for CIR, and is planning to speak to her family about likely need for 24/7 supervision at discharge.  We discussed timing of opening insurance authorization and likelihood that if pt were able to admit to CIR, insurance would not likely approve SNF rehab following CIR.    Signed: Shann Medal, PT, DPT Admissions Coordinator [phone] 02/24/19  11:01 AM

## 2019-02-24 NOTE — Progress Notes (Signed)
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 His fall was witnessed by his wife when he fell backward and hit his head. He did not lose consciousness. Pt with CHF exacerbation, afib with RVR, and COPD.   OT comments  Pt making steady progress towards OT goals this session. Session focus on standing grooming tasks at sink and functional mobility. Pt required min guard- min A for standing grooming at sink.Pt required cues for RW placement and to sequence oral care as pt noted to attempt to brush teeth with soap. Min guard for balance during standing task with RW. Pt complete household distance functional mobility with RW and min - mod A +2. Pt requires increased time for transitions as pt noted to get tripped up when turning in hallway. DC plan remains appropriate, will continue to follow acutely per POC.    Follow Up Recommendations  CIR;Supervision/Assistance - 24 hour    Equipment Recommendations  3 in 1 bedside commode    Recommendations for Other Services      Precautions / Restrictions Precautions Precautions: Fall Precaution Comments: falls in the past few months Restrictions Weight Bearing Restrictions: No       Mobility Bed Mobility Overal bed mobility: Needs Assistance Bed Mobility: Supine to Sit     Supine to sit: Min assist     General bed mobility comments: assist to elevate trunk into sitting  Transfers Overall transfer level: Needs assistance Equipment used: Rolling walker (2 wheeled) Transfers: Sit to/from Stand Sit to Stand: Mod assist;From elevated surface         General transfer comment: cues for safe hand placement; assistance to  power up into standing and to gain balance upon standing    Balance Overall balance assessment: Needs assistance Sitting-balance support: Single extremity supported;Feet supported Sitting balance-Leahy Scale: Good     Standing balance support: Bilateral upper extremity supported Standing balance-Leahy Scale: Poor Standing balance comment: modA for dynamic standing balance                           ADL either performed or assessed with clinical judgement   ADL Overall ADL's : Needs assistance/impaired     Grooming: Min guard;Minimal assistance;Standing;Cueing for safety;Cueing for sequencing;Oral care Grooming Details (indicate cue type and reason): MIN A for safety and Rw placement, pt initially attempting to brush teeth with body wash needing cues. once oriented to toothpaste pt not putting any paste on brush but able to self- correct after putting toothbrush into mouth with nothing on it                 Toilet Transfer: Moderate assistance;Cueing for safety;Cueing for sequencing;Ambulation;+2 for safety/equipment Toilet Transfer Details (indicate cue type and reason): MOD A +2 for simulated toilet transfer via functional mobility with RW. Pt with scissoring gait pattern during functional mobility needing cues to attempt to self- correct. pt requires increased time for transitions as pt tends to get his feet tangled up when turning         Functional mobility during ADLs: Moderate assistance;Rolling walker;Cueing for safety;Cueing for sequencing;+2 for safety/equipment General ADL Comments: pt limited by decreased strength, poor mobility  and decreased safety awareness. session focus on standing grooming tasks at sink with RW and cues for safety     Vision Baseline Vision/History: No visual deficits     Perception     Praxis      Cognition Arousal/Alertness: Awake/alert Behavior During Therapy: WFL for tasks assessed/performed Overall Cognitive Status: No  family/caregiver present to determine baseline cognitive functioning Area of Impairment: Safety/judgement;Problem solving                         Safety/Judgement: Decreased awareness of safety;Decreased awareness of deficits   Problem Solving: Requires verbal cues;Difficulty sequencing General Comments: pt noted to attempt to brush his teeth with soap need cues to correct        Exercises     Shoulder Instructions       General Comments unable to get SpO2 reading due to poor waveform so pt left on 2L O2 via [place] during session    Pertinent Vitals/ Pain       Pain Assessment: Faces Faces Pain Scale: Hurts little more Pain Location: L hand (wound of palm of hand from previous fall) Pain Descriptors / Indicators: Guarding;Sore Pain Intervention(s): Limited activity within patient's tolerance;Monitored during session;Repositioned  Home Living                                          Prior Functioning/Environment              Frequency  Min 2X/week        Progress Toward Goals  OT Goals(current goals can now be found in the care plan section)  Progress towards OT goals: Progressing toward goals  Acute Rehab OT Goals Patient Stated Goal: to increase ability OT Goal Formulation: With patient Time For Goal Achievement: 03/08/19 Potential to Achieve Goals: Good  Plan Discharge plan remains appropriate    Co-evaluation    PT/OT/SLP Co-Evaluation/Treatment: Yes Reason for Co-Treatment: For patient/therapist safety;To address functional/ADL transfers PT goals addressed during session: Mobility/safety with mobility;Balance        AM-PAC OT "6 Clicks" Daily Activity     Outcome Measure   Help from another person eating meals?: None Help from another person taking care of personal grooming?: A Little Help from another person toileting, which includes using toliet, bedpan, or urinal?: A Lot Help from another person bathing (including  washing, rinsing, drying)?: A Lot Help from another person to put on and taking off regular upper body clothing?: A Little Help from another person to put on and taking off regular lower body clothing?: A Lot 6 Click Score: 16    End of Session Equipment Utilized During Treatment: Gait belt;Rolling walker;Oxygen;Other (comment)(2L O2 via [place])  OT Visit Diagnosis: Unsteadiness on feet (R26.81);Muscle weakness (generalized) (M62.81)   Activity Tolerance Patient tolerated treatment well   Patient Left in chair;with call bell/phone within reach;with chair alarm set   Nurse Communication          Time: 1013-1040 OT Time Calculation (min): 27 min  Charges: OT General Charges $OT Visit: 1 Visit OT Treatments $Self Care/Home Management : 8-22 mins  Lanier Clam., COTA/L Acute Rehabilitation Services [phone]    Ihor Gully 02/24/2019, 2:51 PM

## 2019-02-24 NOTE — Progress Notes (Signed)
  Date: 02/24/2019  Patient name: Chad Franklin  Medical record number: [id number]  Date of birth: 1934/12/02        I have seen and evaluated this patient and I have discussed the plan of care with the house staff. Please see their note for complete details. I concur with their findings with the following additions/corrections: Chad Franklin was seen this morning on team rounds.  Heart failure has started milrinone along with IV furosemide drip for his cardiogenic shock secondary to biventricular failure which was exacerbated on admission by A. fib.  Heart failure team indicates few options for his treatment so we will get palliative care consulted for [place] conversation.  Bartholomew Crews, MD 02/24/2019, 4:18 PM

## 2019-02-24 NOTE — Progress Notes (Addendum)
Advanced Heart Failure Rounding Note  PCP-Cardiologist: Buford Dresser, MD   Subjective:    Admitted w/ volume overload and low output. Initial Co-ox 36%. Started on milrinone. Repeat Co-ox 76%.   Reports subjective improvement. States he feels better. Remains on [place].    Little UOP despite Lasix gtt. 500 cc documented yesterday. 300 cc in foley bag. SCr stable past 24 hrs at 1.67 but still well above baseline (1.13 on admit). K 3.2.   Afib w/ RVR on tele, Vrates in the 110s. BP stable. CVP 14.    Objective:   Weight Range: 93.2 kg Body mass index is 31.24 kg/m.   Vital Signs:   Temp:  [97.5 F (36.4 C)-98.4 F (36.9 C)] 97.5 F (36.4 C) (12/08 0721) Pulse Rate:  [69-99] 94 (12/08 0721) Resp:  [20] 20 (12/08 0721) BP: (95-139)/(60-83) 139/83 (12/08 0721) SpO2:  [94 %-98 %] 98 % (12/08 0721) Weight:  [93.2 kg] 93.2 kg (12/08 0420) Last BM Date: 02/22/19  Weight change: Filed Weights   02/22/19 0526 02/23/19 0658 02/24/19 0420  Weight: 94.3 kg 94.1 kg 93.2 kg    Intake/Output:   Intake/Output Summary (Last 24 hours) at 02/24/2019 0828 Last data filed at 02/24/2019 0700 Gross per 24 hour  Intake 1531.61 ml  Output 501 ml  Net 1030.61 ml      Physical Exam    CVP 14 PHYSICAL EXAM: General:  Fatigue appearing elderly WM. No respiratory difficulty HEENT: normal anicteric Neck: supple. elevated JVD to ear. Carotids 2+ bilat; no bruits. No lymphadenopathy or thyromegaly appreciated. Cor: PMI nondisplaced. irregularly irregular rhythm, tachy rate  +s3 Lungs: clear no wheeze Abdomen: obese soft, nontender, nondistended. No hepatosplenomegaly. No bruits or masses. Good bowel sounds. Extremities: no cyanosis, clubbing, rash, 1+ bilateral edema, bilateral unna boots  Neuro: alert & oriented but seems confused at times. HOH , cranial nerves grossly intact. moves all 4 extremities w/o difficulty. Affect pleasant   Telemetry   afib 110-120s Personally  reviewed   EKG    No new EKGs to review   Labs    CBC Recent Labs    02/22/19 0321 02/23/19 0728  WBC 9.1 9.3  HGB 12.6* 12.3*  HCT 41.1 39.0  MCV 94.5 92.4  PLT 220 073   Basic Metabolic Panel Recent Labs    02/21/19 2003 02/22/19 0321 02/23/19 0728 02/24/19 0343  NA 139 140 140 138  K 3.1* 4.7 3.8 3.2*  CL 99 102 101 98  CO2 30 28 26 28   GLUCOSE 163* 126* 136* 115*  BUN 29* 25* 32* 39*  CREATININE 1.20 1.13 1.62* 1.67*  CALCIUM 8.3* 8.0* 8.4* 8.2*  MG 1.9 1.9  --   --    Liver Function Tests Recent Labs    02/22/19 0321 02/23/19 0728  AST 54* 61*  58*  ALT 20 35  32  ALKPHOS 70 67  67  BILITOT 1.3* 1.8*  1.4*  PROT 6.9 6.5  6.4*  ALBUMIN 3.0* 2.8*  2.8*   No results for input(s): LIPASE, AMYLASE in the last 72 hours. Cardiac Enzymes No results for input(s): CKTOTAL, CKMB, CKMBINDEX, TROPONINI in the last 72 hours.  BNP: BNP (last 3 results) Recent Labs    02/21/19 2003  BNP 1,554.5*    ProBNP (last 3 results) No results for input(s): PROBNP in the last 8760 hours.   D-Dimer No results for input(s): DDIMER in the last 72 hours. Hemoglobin A1C No results for input(s): HGBA1C in the last  72 hours. Fasting Lipid Panel No results for input(s): CHOL, HDL, LDLCALC, TRIG, CHOLHDL, LDLDIRECT in the last 72 hours. Thyroid Function Tests Recent Labs    02/22/19 0321  TSH 2.136    Other results:   Imaging    Dg Chest Port 1 View  Result Date: 02/23/2019 CLINICAL DATA:  PICC line placement EXAM: PORTABLE CHEST 1 VIEW COMPARISON:  Radiograph 02/21/2019 FINDINGS: Right upper extremity PICC terminates at the mid SVC. Cardiomegaly is similar to prior. Volumes are diminished. Accentuation of the chronic bronchitic and interstitial changes seen on comparison. No new consolidative opacity. Elevation of the right hemidiaphragm with some adjacent linear atelectasis in the right lung base. No pneumothorax. No visible effusion. Diffusely  demineralized osseous structures. No acute osseous or soft tissue abnormality. IMPRESSION: 1. Right upper extremity PICC terminates at the mid SVC. 2. No pneumothorax. 3. Stable cardiomegaly. 4. Low lung volumes with chronic bronchitic and interstitial changes. 5. Elevation of the right hemidiaphragm with some adjacent linear atelectasis. Electronically Signed   By: Kreg Shropshire M.D.   On: 02/23/2019 14:42   [place] Ekg Site Rite  Result Date: 02/23/2019 If Site Rite image not attached, placement could not be confirmed due to current cardiac rhythm.     Medications:     Scheduled Medications: . alum & mag hydroxide-simeth  30 mL Oral Once  . apixaban  5 mg Oral BID  . Chlorhexidine Gluconate Cloth  6 each Topical Daily  . PARoxetine  20 mg Oral Daily  . potassium chloride  40 mEq Oral BID  . potassium chloride  40 mEq Oral STAT  . pravastatin  10 mg Oral Daily  . ramelteon  8 mg Oral QHS  . sodium chloride flush  10-40 mL Intracatheter Q12H     Infusions: . furosemide (LASIX) infusion 8 mg/hr (02/23/19 1728)  . milrinone 0.25 mcg/kg/min (02/24/19 0105)     PRN Medications:  acetaminophen **OR** acetaminophen, sodium chloride flush    Patient Profile   84 y/o male with permanent AF, chronic systolic HF due to presumed NICM (myvoiew 2019 - no cath) LVEF 20-25% with severe RV HK previous CVA with left sided weakness admitted for a/c systolic HF w/ cardiogenic shock.   Assessment/Plan   1. Cardiogenic Shock Biventricular HF  ECHO EF 20% Severely reduced LV/RV function.  - Admitted w/ cardiogenic shock. Initial Co-ox prior to inotrope 36%. Improved w/ milrinone 76% today. - Mixed venous saturation 36%. Worsening renal function. Creatinine trending up 1.1>1.6 .   -Continues to have sluggish response to IV lasix despite milrinone. Only 500 cc out yesterday. 300 cc in foley bag. SCr stable at 1.6.  -Losartan, Coreg and Spiro discontinued due to shock and AKI.  - remains volume  overloaded. Continue milrinone 0.25 mcg and IV lasix drip, ? Increase rate to 12 mg/hr - supp K - continue unna boots - Not a candidate for LVAD given Age/RV failure.   - Needs palliative care consult to discuss GOC  2. Chronic  A Fib - Diltiazem discontinued with reduced EF. Vrates in the 110s w/ milrinone. BP stable.  -Can use IV amiodarone if needed for rate control  -Continue eliquis 5 mg twice a day. May need to reduce dose with worsening serum creatinine >1.5 and age >33.  - supp K   3. Elevated HS Troponin -28>47>93 - no ischemic cp -Suspect related to demand ischemia   4. H/O CVA  -LUE chronic weakness. On statin + elquis.   5. Falls  -  Will need PT.   6. Hypokalemia: 3.2 today. -supp K    Length of Stay: 2  Chad Lis, PA-C  02/24/2019, 8:28 AM  Advanced Heart Failure Team Pager [phone] (M-F; 7a - 4p)  Please contact [facility] for night-coverage after hours (4p -7a ) and weekends on [email or web address]  Patient seen and examined with the above-signed Advanced Practice Provider and/or Housestaff. I personally reviewed laboratory data, imaging studies and relevant notes. I independently examined the patient and formulated the important aspects of the plan. I have edited the note to reflect any of my changes or salient points. I have personally discussed the plan with the patient and/or family.  He feels better on milrinone. More alert. Less fatigued and easier to breathe. Co-ox improved. Volume status improving with IV lasix but CVP still 14-15.   Long talk with him and his daughter. They realize he is end-stage and agree with Palliative Care and have correctly pointed out that CIR consult likely not the right idea if the plan is to get him to spend as much time at home as he can with Hospice support.   We discussed the role of tuning him up with milrinone and IV lasix to see if we can make him feel better at home for a few weeks to months.  They realize that we  would not pursue home inotropes or other advanced therapies.   Arvilla Meres, MD  10:10 PM

## 2019-02-24 NOTE — Progress Notes (Signed)
Subjective:   Pt seen at the bedside on rounds this AM. He says he feels much better today than he has over the past couple days. He says he slept well overnight and has no issues this AM. Denies SOB, chest pain, or abdominal pain. We discussed how he and his family were interested in talking with Palliative Care to discuss long term goals of care today. Pt states his daughter will be at the hospital later today. All questions were answered.   Objective:  Pertinent labs/Imaging: CBC Latest Ref Rng & Units 02/23/2019 02/22/2019 02/21/2019  WBC 4.0 - 10.5 K/uL 9.3 9.1 6.5  Hemoglobin 13.0 - 17.0 g/dL 12.3(L) 12.6(L) 13.3  Hematocrit 39.0 - 52.0 % 39.0 41.1 43.5  Platelets 150 - 400 K/uL 210 220 221    CMP Latest Ref Rng & Units 02/24/2019 02/23/2019 02/23/2019  Glucose 70 - 99 mg/dL 115(H) - 136(H)  BUN 8 - 23 mg/dL 39(H) - 32(H)  Creatinine 0.61 - 1.24 mg/dL 1.67(H) - 1.62(H)  Sodium 135 - 145 mmol/L 138 - 140  Potassium 3.5 - 5.1 mmol/L 3.2(L) - 3.8  Chloride 98 - 111 mmol/L 98 - 101  CO2 22 - 32 mmol/L 28 - 26  Calcium 8.9 - 10.3 mg/dL 8.2(L) - 8.4(L)  Total Protein 6.5 - 8.1 g/dL - 6.5 6.4(L)  Total Bilirubin 0.3 - 1.2 mg/dL - 1.8(H) 1.4(H)  Alkaline Phos 38 - 126 U/L - 67 67  AST 15 - 41 U/L - 61(H) 58(H)  ALT 0 - 44 U/L - 35 32   Echocardiogram: 1. Left ventricular ejection fraction, by visual estimation, is 20 to 25%. The left ventricle has severely decreased function. There is no left ventricular hypertrophy.  2. Left ventricular diastolic function could not be evaluated.  3. Right ventricular volume and pressure overload.  4. Global right ventricle has severely reduced systolic function.The right ventricular size is severely enlarged. No increase in right ventricular wall thickness.  5. Left atrial size was severely dilated.  6. Right atrial size was severely dilated.  7. The mitral valve is normal in structure. Moderate mitral valve regurgitation. No evidence of mitral  stenosis.  8. The tricuspid valve is normal in structure. Tricuspid valve regurgitation moderate.  9. The aortic valve is normal in structure. Aortic valve regurgitation is not visualized. No evidence of aortic valve sclerosis or stenosis. 10. The pulmonic valve was normal in structure. Pulmonic valve regurgitation is mild. 11. Moderately elevated pulmonary artery systolic pressure. 12. The tricuspid regurgitant velocity is 2.84 m/s, and with an assumed right atrial pressure of 15 mmHg, the estimated right ventricular systolic pressure is moderately elevated at 47.3 mmHg. 13. The inferior vena cava is dilated in size with <50% respiratory variability, suggesting right atrial pressure of 15 mmHg.   Vital signs in last 24 hours: Vitals:   02/23/19 1239 02/23/19 2017 02/24/19 0027 02/24/19 0420  BP: 107/71 112/60 103/68 115/71  Pulse: 70 85 93 99  Resp: 20     Temp: 98.4 F (36.9 C) 97.7 F (36.5 C) 97.6 F (36.4 C) (!) 97.5 F (36.4 C)  TempSrc: Axillary Oral Oral Oral  SpO2: 96%     Weight:    93.2 kg  Height:       Physical Exam: General: Comfortable appearing, resting in bed HEENT: NCAT CV: irregular rhythm, Normal S1 & S2, no murmurs rubs or gallops appreciated.  2+ pitting edema bilateral lower extremities up to the knees PULM: CTAB w/ mild wheezing at  end expiration. No crackles appreciated ABD: Soft & nontender in all quadrants NEURO: Alert, no focal deficits  Assessment/Plan:  Active Problems:   Atrial fibrillation with RVR (HCC)   Acute exacerbation of CHF (congestive heart failure) (HCC)   Fall  In summary, Mr. Milam is an 84 year old male with a pertinent past medical history of embolic CVA (residual left-sided weakness), A. fib (Eliquis), HFrEF (25-30%), hypertension, who presented with acute on chronic CHF secondary to A. fib with RVR.  #HFrEF exacerbation (EF 20-25% Apr 03, 2019): The patient has had minimal/poor urine output despite high doses of IV Lasix with  worsening of kidney function, likely secondary to cardiorenal syndrome. Cardiology and heart failure teams are following the patient.  Will appreciate recommendations - Spoke to family about patients progressed and discussed palliative care. Patient's daughter agrees to consult.  Palliative consult has been placed, will appreciate recommendations. -Heart Failure team recommendations  -Continue milrinone 0.25 mcg and IV Lasix drip. Output only 300 cc in the last 24 hrs.  - Strict I/Os, daily weights  #A. fib with RVR: Improved. Patient is currently in A. fib but has good rate control over the past 24 hours.  No longer on the diltiazem drip. -Discontinued metoprolol, diltiazem- Continue Eliquis  - Appreciate cardiologies recommendations.  - If pt enters RVR again, can use IV amiodarone for control  #Delirium 2/2 to low output heart failure - patient's acute mental status change could be secondary to low output heart failure.  Patient's showing signs of kidney dysfunction and elevation AST concerning for liver involvement.Marland Kitchen  #HLD -Pravastatin 10 mg daily   #FEN/GI -Diet: Heart Healthy -Fluids: none  #DVT prophylaxis -Eliquis 2.5mg  BID  #CODE STATUS: DNI  #Dispo: Anticipated discharge pending clinical improvement.   Kirt Boys, MD Internal Medicine, PGY1 Pager: [phone]  02/24/2019,11:52 AM

## 2019-02-25 DIAGNOSIS — Z7189 Other specified counseling: Secondary | ICD-10-CM

## 2019-02-25 DIAGNOSIS — Z515 Encounter for palliative care: Secondary | ICD-10-CM

## 2019-02-25 DIAGNOSIS — I509 Heart failure, unspecified: Secondary | ICD-10-CM

## 2019-02-25 DIAGNOSIS — Z66 Do not resuscitate: Secondary | ICD-10-CM

## 2019-02-25 DIAGNOSIS — Z789 Other specified health status: Secondary | ICD-10-CM

## 2019-02-25 LAB — BASIC METABOLIC PANEL
Anion gap: 13 (ref 5–15)
BUN: 30 mg/dL — ABNORMAL HIGH (ref 8–23)
CO2: 30 mmol/L (ref 22–32)
Calcium: 8.2 mg/dL — ABNORMAL LOW (ref 8.9–10.3)
Chloride: 96 mmol/L — ABNORMAL LOW (ref 98–111)
Creatinine, Ser: 1.3 mg/dL — ABNORMAL HIGH (ref 0.61–1.24)
GFR calc Af Amer: 59 mL/min — ABNORMAL LOW (ref >60)
GFR calc non Af Amer: 51 mL/min — ABNORMAL LOW (ref >60)
Glucose, Bld: 120 mg/dL — ABNORMAL HIGH (ref 70–99)
Potassium: 3.2 mmol/L — ABNORMAL LOW (ref 3.5–5.1)
Sodium: 139 mmol/L (ref 135–145)

## 2019-02-25 LAB — CBC
HCT: 37.3 % — ABNORMAL LOW (ref 39.0–52.0)
HCT: 38.3 % — ABNORMAL LOW (ref 39.0–52.0)
Hemoglobin: 12 g/dL — ABNORMAL LOW (ref 13.0–17.0)
Hemoglobin: 12.5 g/dL — ABNORMAL LOW (ref 13.0–17.0)
MCH: 29.3 pg (ref 26.0–34.0)
MCH: 29.3 pg (ref 26.0–34.0)
MCHC: 32.2 g/dL (ref 30.0–36.0)
MCHC: 32.6 g/dL (ref 30.0–36.0)
MCV: 91.2 fL (ref 80.0–100.0)
MCV: 89.7 fL (ref 80.0–100.0)
Platelets: 207 10*3/uL (ref 150–400)
Platelets: 214 10*3/uL (ref 150–400)
RBC: 4.09 MIL/uL — ABNORMAL LOW (ref 4.22–5.81)
RBC: 4.27 MIL/uL (ref 4.22–5.81)
RDW: 15.4 % (ref 11.5–15.5)
RDW: 15.4 % (ref 11.5–15.5)
WBC: 8.5 10*3/uL (ref 4.0–10.5)
WBC: 8.8 10*3/uL (ref 4.0–10.5)
nRBC: 0 % (ref 0.0–0.2)
nRBC: 0 % (ref 0.0–0.2)

## 2019-02-25 LAB — COOXEMETRY PANEL
Carboxyhemoglobin: 1.8 % — ABNORMAL HIGH (ref 0.5–1.5)
Carboxyhemoglobin: 1.9 % — ABNORMAL HIGH (ref 0.5–1.5)
Methemoglobin: 0.9 % (ref 0.0–1.5)
Methemoglobin: 0.9 % (ref 0.0–1.5)
O2 Saturation: 78.4 %
O2 Saturation: 71.7 %
Total hemoglobin: 12.3 g/dL (ref 12.0–16.0)
Total hemoglobin: 13.1 g/dL (ref 12.0–16.0)

## 2019-02-25 MED ORDER — SPIRONOLACTONE 12.5 MG HALF TABLET
12.5000 mg | ORAL_TABLET | Freq: Every day | ORAL | Status: DC
  Administered 2019-02-25: 12.5 mg via ORAL
  Filled 2019-02-25: qty 1

## 2019-02-25 MED ORDER — POTASSIUM CHLORIDE CRYS ER 20 MEQ PO TBCR
60.0000 meq | EXTENDED_RELEASE_TABLET | Freq: Once | ORAL | Status: AC
  Administered 2019-02-25: 60 meq via ORAL
  Filled 2019-02-25: qty 3

## 2019-02-25 MED ORDER — MILRINONE LACTATE IN DEXTROSE 20-5 MG/100ML-% IV SOLN: 0.1250 ug/kg/min | INTRAVENOUS | Status: DC

## 2019-02-25 MED ORDER — MORPHINE SULFATE (CONCENTRATE) 10 MG/0.5ML PO SOLN: 10.0000 mg | ORAL | Status: DC | PRN

## 2019-02-25 MED ORDER — LORAZEPAM 1 MG PO TABS
1.0000 mg | ORAL_TABLET | ORAL | Status: DC | PRN
  Administered 2019-02-26 (×2): 1 mg via ORAL
  Filled 2019-02-25 (×2): qty 1

## 2019-02-25 MED ORDER — AMIODARONE HCL IN DEXTROSE 360-4.14 MG/200ML-% IV SOLN
30.0000 mg/h | INTRAVENOUS | Status: DC
  Administered 2019-02-25 – 2019-02-26 (×2): 30 mg/h via INTRAVENOUS
  Filled 2019-02-25 (×3): qty 200

## 2019-02-25 MED ORDER — TORSEMIDE 20 MG PO TABS
40.0000 mg | ORAL_TABLET | Freq: Every day | ORAL | Status: DC
  Administered 2019-02-25: 40 mg via ORAL
  Filled 2019-02-25 (×2): qty 2

## 2019-02-25 MED ORDER — AMIODARONE HCL IN DEXTROSE 360-4.14 MG/200ML-% IV SOLN
60.0000 mg/h | INTRAVENOUS | Status: DC
  Administered 2019-02-25: 60 mg/h via INTRAVENOUS

## 2019-02-25 MED ORDER — APIXABAN 5 MG PO TABS
5.0000 mg | ORAL_TABLET | Freq: Two times a day (BID) | ORAL | Status: DC
  Administered 2019-02-25: 5 mg via ORAL
  Filled 2019-02-25 (×2): qty 1

## 2019-02-25 MED ORDER — MILRINONE LACTATE IN DEXTROSE 20-5 MG/100ML-% IV SOLN
0.1250 ug/kg/min | INTRAVENOUS | Status: DC
  Administered 2019-02-26: 0.125 ug/kg/min via INTRAVENOUS
  Filled 2019-02-25: qty 100

## 2019-02-25 NOTE — Progress Notes (Signed)
Palliative consult received- thank you for this consult.   Chart reviewed.   Plan made for [place] meeting with patient's three daughters at [place], [facility] Palliative Medicine  Please call Palliative Medicine team phone with any questions [phone]. For individual providers please see AMION.

## 2019-02-25 NOTE — Progress Notes (Signed)
Inpatient Rehab Admissions Coordinator:   Reviewed notes from Dr. Haroldine Laws and DR. Alexander.  Note that pt and family interested in pursuing hospice at this time and declining CIR, so I will sign off.  If family should wish to pursue CIR following [place] meeting this afternoon, please re-consult.    Shann Medal, PT, DPT Admissions Coordinator [phone] 02/25/19  12:21 PM

## 2019-02-25 NOTE — Progress Notes (Signed)
Subjective:   Pt seen at the bedside on rounds this AM. Chad Franklin was examined sitting in the bedside recliner. He was noted to be tachycardic but denies palpitation, chest pain, lower extremity edema or shortness of breath. He does endorse coughing spells. We also re-iterated that overall plan moving forward which includes transitioning to home hospice and he is up to date. He said it was okay to call his daughters. No complaints at this time.   Objective:  Pertinent labs/Imaging: CBC Latest Ref Rng & Units 02/25/2019 02/23/2019 02/22/2019  WBC 4.0 - 10.5 K/uL 8.5 9.3 9.1  Hemoglobin 13.0 - 17.0 g/dL 12.0(L) 12.3(L) 12.6(L)  Hematocrit 39.0 - 52.0 % 37.3(L) 39.0 41.1  Platelets 150 - 400 K/uL 207 210 220   BMP Latest Ref Rng & Units 02/25/2019 02/24/2019 02/23/2019  Glucose 70 - 99 mg/dL 528(U) 132(G) 401(U)  BUN 8 - 23 mg/dL 27(O) 53(G) 64(Q)  Creatinine 0.61 - 1.24 mg/dL 0.34(V) 4.25(Z) 5.63(O)  Sodium 135 - 145 mmol/L 139 138 140  Potassium 3.5 - 5.1 mmol/L 3.2(L) 3.2(L) 3.8  Chloride 98 - 111 mmol/L 96(L) 98 101  CO2 22 - 32 mmol/L 30 28 26   Calcium 8.9 - 10.3 mg/dL 8.2(L) 8.2(L) 8.4(L)   Echocardiogram: 1. Left ventricular ejection fraction, by visual estimation, is 20 to 25%. The left ventricle has severely decreased function. There is no left ventricular hypertrophy.  2. Left ventricular diastolic function could not be evaluated.  3. Right ventricular volume and pressure overload.  4. Global right ventricle has severely reduced systolic function.The right ventricular size is severely enlarged. No increase in right ventricular wall thickness.  5. Left atrial size was severely dilated.  6. Right atrial size was severely dilated.  7. The mitral valve is normal in structure. Moderate mitral valve regurgitation. No evidence of mitral stenosis.  8. The tricuspid valve is normal in structure. Tricuspid valve regurgitation moderate.  9. The aortic valve is normal in structure. Aortic  valve regurgitation is not visualized. No evidence of aortic valve sclerosis or stenosis. 10. The pulmonic valve was normal in structure. Pulmonic valve regurgitation is mild. 11. Moderately elevated pulmonary artery systolic pressure. 12. The tricuspid regurgitant velocity is 2.84 m/s, and with an assumed right atrial pressure of 15 mmHg, the estimated right ventricular systolic pressure is moderately elevated at 47.3 mmHg. 13. The inferior vena cava is dilated in size with <50% respiratory variability, suggesting right atrial pressure of 15 mmHg.  Vital signs in last 24 hours: Vitals:   02/24/19 1950 02/25/19 0038 02/25/19 0101 02/25/19 0400  BP: 127/81 126/81  (!) 171/149  Pulse: (!) 119 (!) 42  (!) 119  Resp: 20 20  20   Temp: 98.4 F (36.9 C) 97.7 F (36.5 C)  97.7 F (36.5 C)  TempSrc: Oral Oral  Oral  SpO2: 100% 95%  95%  Weight:   108.1 kg   Height:       Physical Exam: General: Sitting up in the bedside chair, comfortable appearing HEENT: NCAT, [place] in place CV: Tachycardia, irregular rhythm, no mumurs rubs or gallops. 2+ pitting edema bilateral up to the knees PULM: CTAB, no wheezes or crackles ABD: Soft, and nontender NEURO: Alert, no gross deficits noted  Assessment/Plan:  Active Problems:   Atrial fibrillation with RVR (HCC)   Acute exacerbation of CHF (congestive heart failure) (HCC)   Fall  In summary, Chad Franklin is an 84 year old male with a pertinent past medical history of embolic CVA (residual left-sided weakness),  A. fib (Eliquis), HFrEF (25-30%), hypertension, who presented with acute on chronic CHF secondary to A. fib with RVR.  #HFrEF exacerbation (EF 20-25% 02/2019): Diuresed 3.5L in the past 24 hrs with the Lasix drip and milrinone. Heart failure team following, and we appreciate their recommendations. HF team had a long discussion with he patient and daughters yesterday and they realize his disease process is end stage. They are amendable to work with  [place] with the goal of taking him home with Hospice support. Does not want CIR. -HF team recommendations  -Continue milrinone and IV Lasix drip. Output improved to 3.5 L in the last 24 hrs.  -Palliative Care consult placed, will follow up recommendations once available.  - Strict I/Os, daily weights  #A. fib with RVR: Patient is currently in A. Fib w/ RVR again (HR 150s), but is asymptomatic and hemodynamically stable.  -Discontinued metoprolol, diltiazem -Continue Eliquis  -Appreciate cardiology's recommendations.  -Will use IV amiodarone for control if pt starts developing symptoms  #Delirium 2/2 to low output heart failure - patient's acute mental status change could be secondary to low output heart failure.  Patient's showing signs of kidney dysfunction and elevation AST concerning for liver involvement.Marland Kitchen  #HLD -Pravastatin 10 mg daily   #FEN/GI -Diet: Heart Healthy -Fluids: none  #DVT prophylaxis -Eliquis 2.5mg  BID  #CODE STATUS: DNI  #Dispo: Anticipated discharge pending clinical improvement.   Earlene Plater, MD Internal Medicine, PGY1 Pager: [phone]  02/25/2019,9:25 AM

## 2019-02-25 NOTE — Progress Notes (Addendum)
Advanced Heart Failure Rounding Note  PCP-Cardiologist: Buford Dresser, MD   Subjective:    Yesterday lasix drip was increased and given dose of metolazone. Brisk diuresis noted. Remains on milrinone 0.25 mcg.  CO-OX  78%.   Feeling much better. Denies SOB.   Objective:   Weight Range: 108.1 kg Body mass index is 36.25 kg/m.   Vital Signs:   Temp:  [97.7 F (36.5 C)-98.4 F (36.9 C)] 97.9 F (36.6 C) (12/09 0729) Pulse Rate:  [42-133] 133 (12/09 0729) Resp:  [19-20] 19 (12/09 0729) BP: (115-171)/(62-149) 147/86 (12/09 0729) SpO2:  [93 %-100 %] 98 % (12/09 0729) Weight:  [108.1 kg] 108.1 kg (12/09 0101) Last BM Date: 02/22/19  Weight change: Filed Weights   02/23/19 0658 02/24/19 0420 02/25/19 0101  Weight: 94.1 kg 93.2 kg 108.1 kg    Intake/Output:   Intake/Output Summary (Last 24 hours) at 02/25/2019 0946 Last data filed at 02/25/2019 0742 Gross per 24 hour  Intake 1564.59 ml  Output 5501 ml  Net -3936.41 ml      Physical Exam    CVP 6-7 personally checked.  General:  Elderly Sitting in the chair. No resp difficulty HEENT: normal anicteric Neck: supple. JVP 6-7 . Carotids 2+ bilat; no bruits. No lymphadenopathy or thryomegaly appreciated. Cor: PMI nondisplaced. Tachy Irregular rate & rhythm. No rubs, gallops or murmurs. Lungs: clear no wheeze Abdomen: obese soft, nontender, nondistended. No hepatosplenomegaly. No bruits or masses. Good bowel sounds. Extremities: no cyanosis, clubbing, rash, 1+ edema. RUE PICC Neuro: alert & oriented x 3, cranial nerves grossly intact. moves all 4 extremities w/o difficulty. Affect pleasant    Telemetry    Afib 130-150s. Personally reviewed    EKG    No new EKGs to review   Labs    CBC Recent Labs    02/23/19 0728 02/25/19 0340  WBC 9.3 8.5  HGB 12.3* 12.0*  HCT 39.0 37.3*  MCV 92.4 91.2  PLT 210 161   Basic Metabolic Panel Recent Labs    02/24/19 0343 02/25/19 0340  NA 138 139  K 3.2*  3.2*  CL 98 96*  CO2 28 30  GLUCOSE 115* 120*  BUN 39* 30*  CREATININE 1.67* 1.30*  CALCIUM 8.2* 8.2*   Liver Function Tests Recent Labs    02/23/19 0728  AST 61*  58*  ALT 35  32  ALKPHOS 67  67  BILITOT 1.8*  1.4*  PROT 6.5  6.4*  ALBUMIN 2.8*  2.8*   No results for input(s): LIPASE, AMYLASE in the last 72 hours. Cardiac Enzymes No results for input(s): CKTOTAL, CKMB, CKMBINDEX, TROPONINI in the last 72 hours.  BNP: BNP (last 3 results) Recent Labs    02/21/19 2003  BNP 1,554.5*    ProBNP (last 3 results) No results for input(s): PROBNP in the last 8760 hours.   D-Dimer No results for input(s): DDIMER in the last 72 hours. Hemoglobin A1C No results for input(s): HGBA1C in the last 72 hours. Fasting Lipid Panel No results for input(s): CHOL, HDL, LDLCALC, TRIG, CHOLHDL, LDLDIRECT in the last 72 hours. Thyroid Function Tests No results for input(s): TSH, T4TOTAL, T3FREE, THYROIDAB in the last 72 hours.  Invalid input(s): FREET3  Other results:   Imaging    No results found.   Medications:     Scheduled Medications: . alum & mag hydroxide-simeth  30 mL Oral Once  . apixaban  2.5 mg Oral BID  . Chlorhexidine Gluconate Cloth  6 each Topical  Daily  . PARoxetine  20 mg Oral Daily  . potassium chloride  40 mEq Oral BID  . pravastatin  10 mg Oral Daily  . ramelteon  8 mg Oral QHS  . sodium chloride flush  10-40 mL Intracatheter Q12H    Infusions: . amiodarone     Followed by  . amiodarone    . furosemide (LASIX) infusion 10 mg/hr (02/24/19 2251)  . milrinone 0.25 mcg/kg/min (02/25/19 0617)    PRN Medications: acetaminophen **OR** acetaminophen, sodium chloride flush    Patient Profile   84 y/o male with permanent AF, chronic systolic HF due to presumed NICM (myvoiew 2019 - no cath) LVEF 20-25% with severe RV HK previous CVA with left sided weakness admitted for a/c systolic HF w/ cardiogenic shock.   Assessment/Plan   1.  Cardiogenic Shock Biventricular HF  ECHO EF 20% Severely reduced LV/RV function.  - Admitted w/ cardiogenic shock. Initial Co-ox prior to inotrope 36%. - CO-OX stable on milrinone 0.25 mcg. CO-OX 78%. Cut back milrinone to 0.125 mcg. Woulld not use long term milrinone.  - Volume status much improved. CVP down 6-7. Stop lasix drip. Start torsemide 40 mg daily. Creatinine trending down from 1.6>1.3.  - Add 12.5 mg spiro daily.   -No Losartan or Coreg.   - continue unna boots - Not a candidate for LVAD given Age/RV failure.   - Needs palliative care consult to discuss GOC  2. Chronic  A Fib - Diltiazem discontinued with reduced EF.  In A fib RVR. Start amio drip for rate control. Should be able to stop  after milrinone stopped.  -Continue eliquis 5 mg twice a day. May need to reduce dose with worsening serum creatinine >1.5 and age >61.  - supp K   3. Elevated HS Troponin -28>47>93 - no ischemic cp -Suspect related to demand ischemia   4. H/O CVA  -LUE chronic weakness. On statin + elquis.   5. Falls  -Will need PT.   6. Hypokalemia: 3.2 today. -Supp K.   Palliative Care consult pending.   Weights are not accurate. Will need to re weigh on standing scale. Discussed with nursing staff.   Length of Stay: 3  Amy Clegg, NP  02/25/2019, 9:46 AM  Advanced Heart Failure Team Pager [phone] (M-F; 7a - 4p)  Please contact [facility] for night-coverage after hours (4p -7a ) and weekends on [email or web address]  Patient seen and examined with the above-signed Advanced Practice Provider and/or Housestaff. I personally reviewed laboratory data, imaging studies and relevant notes. I independently examined the patient and formulated the important aspects of the plan. I have edited the note to reflect any of my changes or salient points. I have personally discussed the plan with the patient and/or family.  CVP coming down with milrinone and IV diuresis. Feeling better but AF rate now has  accelerated. Will cut milrinone back.  Supp K. Continue IV amio for rate control   Will continue Inotropes for next day or two and then wean. Likely plan d/c with hospice when he is tuned up,.   Arvilla Meres, MD  9:10 PM

## 2019-02-25 NOTE — Progress Notes (Signed)
Pt has nonsustained HR 110 - 145. Notified HF PA.

## 2019-02-25 NOTE — Consult Note (Signed)
Consultation Note Date: 02/25/2019   Patient Name: Chad Franklin  DOB: [birth year]  MRN: [id number]  Age / Sex: 84 y.o., male  PCP: Cyndi Bender, PA-C Referring Physician: Bartholomew Crews, MD  Reason for Consultation: Establishing goals of care  HPI/Patient Profile: 84 y.o. male  with past medical history of chronic a fib, chronic systolic heart failure (EF 25-30%), COPD, stroke, DM2 admitted on 02/21/2019 after a fall at home. Workup revealed advanced end stage heart failure. Heart failure team started him on milrinone, and now amiodorone. He is not candidate for further advanced therapies or home inotropes. Palliative medicine for continued GOC.   Clinical Assessment and Goals of Care:  I have reviewed medical records including EPIC notes, labs and imaging, assessed the patient and then met at the bedside along with his three daughters to discuss diagnosis prognosis, GOC, EOL wishes, disposition and options.  Patient was unable to discuss his medical problems or goals of care due to ongoing confusion. He did not engage or comprehend when [place] discussion was attempted to patient.   I introduced Palliative Medicine as specialized medical care for people living with serious illness. It focuses on providing relief from the symptoms and stress of a serious illness. The goal is to improve quality of life for both the patient and the family.  We discussed a brief life review of the patient. He is known to his family as someone who loves life and enjoys laughing, known for his funny jokes.  As far as functional and nutritional status - daughters note that he has had significant decline in status- especially since his stroke. Even more evident in the last month with increasing falls.   We discussed his current illness and what it means in the larger context of his on-going co-morbidities.  Natural disease  trajectory and expectations at EOL were discussed.  I attempted to elicit values and goals of care important to the patient. Being able to be home at EOL and spend time with his family is primary.    Advanced directives, concepts specific to code status,and rehospitalization were considered and discussed.   Hospice and Palliative Care services outpatient were explained and offered.  Questions and concerns were addressed. The family was encouraged to call with questions or concerns.     Primary Decision Maker NEXT OF KIN- patient's three daughters    SUMMARY OF RECOMMENDATIONS -Family requesting patient discharge home with Hospice support -They request hospice social worker discuss patient's diagnosis and prognosis with their Mom in the home- patient's Mom has cognitive deficits d/t severe seasonal bipolar depression and will need a great amount of grief support -Recommend patient d/c with comfort medications- GFR is stable to use morphine (GFR >30):  -Morphine concentrated liquid 5 mg q1 hr prn for SOB- discussed he may need increases in dosing and frequency for comfort as his illness progresses    -Lorazepam 50m po q4hr prn anxiety  Code Status/Advance Care Planning:  DNR  Additional Recommendations (Limitations, Scope, Preferences):  Avoid Hospitalization  Prognosis:    < 3 months due to advanced heart failure, declining functional status, end stage heart failure- plan for discharge home with Hospice  Discharge Planning: Home with Hospice  Primary Diagnoses: Present on Admission: **None**   I have reviewed the medical record, interviewed the patient and family, and examined the patient. The following aspects are pertinent.  Past Medical History:  Diagnosis Date  . Bilateral swelling of feet   . COPD (chronic obstructive pulmonary disease) ([place])   . Depression   . Diabetes mellitus   . GERD (gastroesophageal reflux disease)   . Palpitations   . Stroke [facility])    2    Social History   Socioeconomic History  . Marital status: Married    Spouse name: Not on file  . Number of children: Not on file  . Years of education: Not on file  . Highest education level: Not on file  Occupational History  . Not on file  Social Needs  . Financial resource strain: Not on file  . Food insecurity    Worry: Not on file    Inability: Not on file  . Transportation needs    Medical: Not on file    Non-medical: Not on file  Tobacco Use  . Smoking status: Former Research scientist (life sciences)  . Smokeless tobacco: Never Used  Substance and Sexual Activity  . Alcohol use: No    Comment: past drinker  . Drug use: Not Currently  . Sexual activity: Not on file  Lifestyle  . Physical activity    Days per week: Not on file    Minutes per session: Not on file  . Stress: Not on file  Relationships  . Social Herbalist on phone: Not on file    Gets together: Not on file    Attends religious service: Not on file    Active member of club or organization: Not on file    Attends meetings of clubs or organizations: Not on file    Relationship status: Not on file  Other Topics Concern  . Not on file  Social History Narrative  . Not on file   Family History  Problem Relation Age of Onset  . Hypertension Father   . Stroke Sister    Scheduled Meds: . alum & mag hydroxide-simeth  30 mL Oral Once  . apixaban  5 mg Oral BID  . Chlorhexidine Gluconate Cloth  6 each Topical Daily  . PARoxetine  20 mg Oral Daily  . potassium chloride  40 mEq Oral BID  . pravastatin  10 mg Oral Daily  . ramelteon  8 mg Oral QHS  . sodium chloride flush  10-40 mL Intracatheter Q12H  . spironolactone  12.5 mg Oral Daily  . torsemide  40 mg Oral Daily   Continuous Infusions: . amiodarone 30 mg/hr (02/25/19 1633)  . milrinone 0.125 mcg/kg/min (02/25/19 1301)   PRN Meds:.acetaminophen **OR** acetaminophen, sodium chloride flush Medications Prior to Admission:  Prior to Admission medications    Medication Sig Start Date End Date Taking? Authorizing Provider  acetaminophen (TYLENOL) 500 MG tablet Take 1 tablet (500 mg total) by mouth every 8 (eight) hours as needed. Patient taking differently: Take 500 mg by mouth 2 (two) times daily.  12/04/17  Yes Love, Ivan Anchors, PA-C  calcium carbonate (TUMS - DOSED IN MG ELEMENTAL CALCIUM) 500 MG chewable tablet Chew 1 tablet by mouth 3 (three) times daily as needed for indigestion or heartburn.   Yes [provider]  ELIQUIS 5 MG TABS tablet TAKE 1 TABLET BY MOUTH TWICE DAILY Patient taking differently: Take 5 mg by mouth 2 (two) times daily.  07/25/18  Yes Buford Dresser, MD  furosemide (LASIX) 80 MG tablet Take 1 tablet (80 mg total) by mouth daily. 03/25/18  Yes Buford Dresser, MD  loratadine (CLARITIN) 10 MG tablet Take 10 mg by mouth daily as needed for allergies.   Yes [provider]  metoprolol succinate (TOPROL-XL) 50 MG 24 hr tablet Take 1 tablet (50 mg total) by mouth daily. Take with or immediately following a meal. Patient taking differently: Take 25 mg by mouth daily. Take with or immediately following a meal. 12/05/18 11/30/19 Yes Buford Dresser, MD  PARoxetine (PAXIL) 20 MG tablet Take 1 tablet (20 mg total) by mouth daily. Patient taking differently: Take 40 mg by mouth every morning.  12/04/17  Yes Love, Ivan Anchors, PA-C  potassium chloride SA (K-DUR,KLOR-CON) 20 MEQ tablet Take 1 tablet (20 mEq total) by mouth daily. 03/25/18  Yes Buford Dresser, MD  pravastatin (PRAVACHOL) 10 MG tablet Take 1 tablet (10 mg total) by mouth daily. 03/25/18  Yes Buford Dresser, MD  methylphenidate (RITALIN) 10 MG tablet Take 0.5 tablets (5 mg total) by mouth 2 (two) times daily with breakfast and lunch. Patient not taking: Reported on 02/21/2019 01/16/19   Jamse Arn, MD   Allergies  Allergen Reactions  . Naproxen Sodium Anaphylaxis, Hives, Shortness Of Breath and Swelling   Review of Systems   Physical Exam  Vital Signs: BP 111/68 (BP Location: Left Arm)   Pulse (!) 117   Temp 97.8 F (36.6 C) (Oral)   Resp 18   Ht '5\' 8"'  (1.727 m)   Wt 104.2 kg   SpO2 98%   BMI 34.94 kg/m  Pain Scale: 0-10   Pain Score: 0-No pain   SpO2: SpO2: 98 % O2 Device:SpO2: 98 % O2 Flow Rate: .O2 Flow Rate (L/min): 2 L/min  IO: Intake/output summary:   Intake/Output Summary (Last 24 hours) at 02/25/2019 1654 Last data filed at 02/25/2019 1506 Gross per 24 hour  Intake 1984.59 ml  Output 8026 ml  Net -6041.41 ml    LBM: Last BM Date: 02/25/19 Baseline Weight: Weight: 99.8 kg Most recent weight: Weight: 104.2 kg     Palliative Assessment/Data: PPS: 50%     Thank you for this consult. Palliative medicine will continue to follow and assist as needed.   Time In: 1600 Time Out: 1715 Time Total: 75 minutes Greater than 50%  of this time was spent counseling and coordinating care related to the above assessment and plan.  Signed by: Mariana Kaufman, AGNP-C Palliative Medicine    Please contact Palliative Medicine Team phone at [phone] for questions and concerns.  For individual provider: See Shea Evans

## 2019-02-25 NOTE — Progress Notes (Signed)
   02/25/19 1010  MEWS Assessment  Is this an acute change? No  MEWS guidelines implemented *See Row Information* Yellow  Provider Notification  Provider Name/Title Darrick Grinder PA  Date Provider Notified 02/25/19  Time Provider Notified [phone]  Notification Type Page  Response See new orders  Date of Provider Response 02/25/19  Time of Provider Response [phone]

## 2019-02-25 NOTE — Progress Notes (Signed)
  Amiodarone Drug - Drug Interaction Consult Note  Recommendations: No medication changes needed at this time  Amiodarone is metabolized by the cytochrome P450 system and therefore has the potential to cause many drug interactions. Amiodarone has an average plasma half-life of 50 days (range 20 to 100 days).   There is potential for drug interactions to occur several weeks or months after stopping treatment and the onset of drug interactions may be slow after initiating amiodarone.   [x]  Statins: Increased risk of myopathy. Simvastatin- restrict dose to 20mg  daily. Other statins: counsel patients to report any muscle pain or weakness immediately. - Currently on pravastatin 10mg  qd   []  Anticoagulants: Amiodarone can increase anticoagulant effect. Consider warfarin dose reduction. Patients should be monitored closely and the dose of anticoagulant altered accordingly, remembering that amiodarone levels take several weeks to stabilize.  []  Antiepileptics: Amiodarone can increase plasma concentration of phenytoin, the dose should be reduced. Note that small changes in phenytoin dose can result in large changes in levels. Monitor patient and counsel on signs of toxicity.  []  Beta blockers: increased risk of bradycardia, AV block and myocardial depression. Sotalol - avoid concomitant use.  []   Calcium channel blockers (diltiazem and verapamil): increased risk of bradycardia, AV block and myocardial depression.  []   Cyclosporine: Amiodarone increases levels of cyclosporine. Reduced dose of cyclosporine is recommended.  []  Digoxin dose should be halved when amiodarone is started.  []  Diuretics: increased risk of cardiotoxicity if hypokalemia occurs.  []  Oral hypoglycemic agents (glyburide, glipizide, glimepiride): increased risk of hypoglycemia. Patient's glucose levels should be monitored closely when initiating amiodarone therapy.   []  Drugs that prolong the QT interval:  Torsades de pointes  risk may be increased with concurrent use - avoid if possible.  Monitor QTc, also keep magnesium/potassium WNL if concurrent therapy can't be avoided. Marland Kitchen Antibiotics: e.g. fluoroquinolones, erythromycin. . Antiarrhythmics: e.g. quinidine, procainamide, disopyramide, sotalol. . Antipsychotics: e.g. phenothiazines, haloperidol.  . Lithium, tricyclic antidepressants, and methadone.    Bonnita Nasuti Pharm.D. CPP, BCPS Clinical Pharmacist [phone] 02/25/2019 10:03 AM

## 2019-02-25 NOTE — Progress Notes (Signed)
  Date: 02/25/2019  Patient name: Chad Franklin  Medical record number: [id number]  Date of birth: [birth year]        I have seen and evaluated this patient and I have discussed the plan of care with the house staff. Please see their note for complete details. I concur with their findings with the following additions/corrections: Chad Franklin was seen this morning on team rounds.  Yesterday, he was on a Lasix drip and milrinone infusion and had a net negative liters output.  His weight was not accurate.  Despite diuresis, his creatinine decreased from 1.67 to 1.30.  Today, heart failure switch from Lasix drip to torsemide 40 daily.  He is not a candidate for any advanced heart failure interventions and palliative care has a [place] conversation set up for the patient and his family later today.  Additionally, he had A. fib with RVR overnight.  Heart failure is starting amiodarone drip with plans to stop it after milrinons is DC'd.  Bartholomew Crews, MD 02/25/2019, 1:34 PM

## 2019-02-25 NOTE — Progress Notes (Signed)
MD has been made aware of pt heartrate per RN on dayshift.  Pt currently resting without any signs of respiratory distress noted.

## 2019-02-25 NOTE — Progress Notes (Signed)
OT Cancellation Note  Patient Details Name: Chad Franklin MRN: [id number] DOB: 1934-11-05   Cancelled Treatment:    Reason Eval/Treat Not Completed: Medical issues which prohibited therapy;Other (comment) RN reports HR between 134-154 bpm at rest. Will check as time allows for OT treatment session.   Lanier Clam., COTA/L Acute Rehabilitation Services [phone] [place] 02/25/2019, 10:50 AM

## 2019-02-26 LAB — COOXEMETRY PANEL
Carboxyhemoglobin: 1.1 % (ref 0.5–1.5)
Methemoglobin: 0.5 % (ref 0.0–1.5)
O2 Saturation: 62.5 %
Total hemoglobin: 12.1 g/dL (ref 12.0–16.0)

## 2019-02-26 LAB — BASIC METABOLIC PANEL
Anion gap: 13 (ref 5–15)
BUN: 22 mg/dL (ref 8–23)
CO2: 31 mmol/L (ref 22–32)
Calcium: 8.1 mg/dL — ABNORMAL LOW (ref 8.9–10.3)
Chloride: 95 mmol/L — ABNORMAL LOW (ref 98–111)
Creatinine, Ser: 1.24 mg/dL (ref 0.61–1.24)
GFR calc Af Amer: >60 mL/min (ref >60)
GFR calc non Af Amer: 54 mL/min — ABNORMAL LOW (ref >60)
Glucose, Bld: 117 mg/dL — ABNORMAL HIGH (ref 70–99)
Potassium: 3.5 mmol/L (ref 3.5–5.1)
Sodium: 139 mmol/L (ref 135–145)

## 2019-02-26 MED ORDER — BIOTENE DRY MOUTH MT LIQD: 15.0000 mL | OROMUCOSAL | Status: DC | PRN

## 2019-02-26 MED ORDER — POLYVINYL ALCOHOL 1.4 % OP SOLN: 1.0000 [drp] | Freq: Four times a day (QID) | OPHTHALMIC | 0 refills | Status: AC | PRN

## 2019-02-26 MED ORDER — ONDANSETRON 4 MG PO TBDP: 4.0000 mg | ORAL_TABLET | Freq: Four times a day (QID) | ORAL | 0 refills | Status: DC | PRN

## 2019-02-26 MED ORDER — GLYCOPYRROLATE 0.2 MG/ML IJ SOLN
0.2000 mg | INTRAMUSCULAR | Status: DC | PRN
  Filled 2019-02-26: qty 1

## 2019-02-26 MED ORDER — MORPHINE SULFATE (CONCENTRATE) 10 MG/0.5ML PO SOLN: 10.0000 mg | ORAL | Status: AC | PRN

## 2019-02-26 MED ORDER — POLYVINYL ALCOHOL 1.4 % OP SOLN: 1.0000 [drp] | Freq: Four times a day (QID) | OPHTHALMIC | 0 refills | Status: DC | PRN

## 2019-02-26 MED ORDER — ONDANSETRON 4 MG PO TBDP: 4.0000 mg | ORAL_TABLET | Freq: Four times a day (QID) | ORAL | Status: DC | PRN

## 2019-02-26 MED ORDER — GLYCOPYRROLATE 0.2 MG/ML IJ SOLN: 0.2000 mg | INTRAMUSCULAR | Status: AC | PRN

## 2019-02-26 MED ORDER — POTASSIUM CHLORIDE CRYS ER 20 MEQ PO TBCR: 20.0000 meq | EXTENDED_RELEASE_TABLET | Freq: Once | ORAL | Status: DC

## 2019-02-26 MED ORDER — HALOPERIDOL LACTATE 5 MG/ML IJ SOLN: 0.5000 mg | INTRAMUSCULAR | Status: DC | PRN

## 2019-02-26 MED ORDER — HALOPERIDOL 0.5 MG PO TABS: 0.5000 mg | ORAL_TABLET | ORAL | Status: AC | PRN

## 2019-02-26 MED ORDER — ONDANSETRON HCL 4 MG/2ML IJ SOLN: 4.0000 mg | Freq: Four times a day (QID) | INTRAMUSCULAR | 0 refills | Status: DC | PRN

## 2019-02-26 MED ORDER — HALOPERIDOL LACTATE 2 MG/ML PO CONC: 0.5000 mg | ORAL | 0 refills | Status: AC | PRN

## 2019-02-26 MED ORDER — HALOPERIDOL LACTATE 5 MG/ML IJ SOLN: 0.5000 mg | INTRAMUSCULAR | Status: AC | PRN

## 2019-02-26 MED ORDER — LORAZEPAM 1 MG PO TABS: 1.0000 mg | ORAL_TABLET | ORAL | 0 refills | Status: DC | PRN

## 2019-02-26 MED ORDER — ONDANSETRON HCL 4 MG/2ML IJ SOLN: 4.0000 mg | Freq: Four times a day (QID) | INTRAMUSCULAR | Status: DC | PRN

## 2019-02-26 MED ORDER — POLYVINYL ALCOHOL 1.4 % OP SOLN
1.0000 [drp] | Freq: Four times a day (QID) | OPHTHALMIC | Status: DC | PRN
  Filled 2019-02-26: qty 15

## 2019-02-26 MED ORDER — SPIRONOLACTONE 25 MG PO TABS
25.0000 mg | ORAL_TABLET | Freq: Every day | ORAL | Status: DC
  Filled 2019-02-26: qty 1

## 2019-02-26 MED ORDER — LORAZEPAM 1 MG PO TABS: 1.0000 mg | ORAL_TABLET | ORAL | 0 refills | Status: AC | PRN

## 2019-02-26 MED ORDER — ONDANSETRON HCL 4 MG/2ML IJ SOLN: 4.0000 mg | Freq: Four times a day (QID) | INTRAMUSCULAR | 0 refills | Status: AC | PRN

## 2019-02-26 MED ORDER — HALOPERIDOL 0.5 MG PO TABS
0.5000 mg | ORAL_TABLET | ORAL | Status: DC | PRN
  Filled 2019-02-26: qty 1

## 2019-02-26 MED ORDER — GLYCOPYRROLATE 1 MG PO TABS
1.0000 mg | ORAL_TABLET | ORAL | Status: DC | PRN
  Filled 2019-02-26: qty 1

## 2019-02-26 MED ORDER — GLYCOPYRROLATE 1 MG PO TABS: 1.0000 mg | ORAL_TABLET | ORAL | Status: AC | PRN

## 2019-02-26 MED ORDER — ONDANSETRON 4 MG PO TBDP: 4.0000 mg | ORAL_TABLET | Freq: Four times a day (QID) | ORAL | 0 refills | Status: AC | PRN

## 2019-02-26 MED ORDER — BIOTENE DRY MOUTH MT LIQD: 15.0000 mL | OROMUCOSAL | Status: AC | PRN

## 2019-02-26 MED ORDER — MORPHINE SULFATE (PF) 2 MG/ML IV SOLN: 2.0000 mg | INTRAVENOUS | 0 refills | Status: AC | PRN

## 2019-02-26 MED ORDER — MORPHINE SULFATE (PF) 2 MG/ML IV SOLN: 2.0000 mg | INTRAVENOUS | 0 refills | Status: DC | PRN

## 2019-02-26 MED ORDER — MORPHINE SULFATE (PF) 2 MG/ML IV SOLN
2.0000 mg | INTRAVENOUS | Status: DC | PRN
  Administered 2019-02-26 (×2): 2 mg via INTRAVENOUS
  Filled 2019-02-26 (×2): qty 1

## 2019-02-26 MED ORDER — HALOPERIDOL LACTATE 2 MG/ML PO CONC
0.5000 mg | ORAL | Status: DC | PRN
  Filled 2019-02-26: qty 0.3

## 2019-02-26 NOTE — Discharge Summary (Signed)
 Name: Chad Franklin MRN: [id number] DOB: 04/26/1934 84 y.o. PCP: Conroy, Nathan, PA-C  Date of Admission: 02/21/2019  7:46 PM Date of Discharge:  Attending Physician: Butcher, Elizabeth A, MD  Discharge Diagnosis: 1.  A. fib with RVR 2.  HFrEF exacerbation 3. Delirium   Discharge Medications: Allergies as of 02/26/2019      Reactions   Naproxen Sodium Anaphylaxis, Hives, Shortness Of Breath, Swelling      Medication List    STOP taking these medications   acetaminophen 500 MG tablet Commonly known as: TYLENOL   calcium carbonate 500 MG chewable tablet Commonly known as: TUMS - dosed in mg elemental calcium   Eliquis 5 MG Tabs tablet Generic drug: apixaban   furosemide 80 MG tablet Commonly known as: LASIX   loratadine 10 MG tablet Commonly known as: CLARITIN   methylphenidate 10 MG tablet Commonly known as: Ritalin   metoprolol succinate 50 MG 24 hr tablet Commonly known as: TOPROL-XL   PARoxetine 20 MG tablet Commonly known as: PAXIL   potassium chloride SA 20 MEQ tablet Commonly known as: KLOR-CON   pravastatin 10 MG tablet Commonly known as: PRAVACHOL     TAKE these medications   antiseptic oral rinse Liqd Apply 15 mLs topically as needed for dry mouth.   glycopyrrolate 1 MG tablet Commonly known as: ROBINUL Take 1 tablet (1 mg total) by mouth every 4 (four) hours as needed (excessive secretions).   glycopyrrolate 0.2 MG/ML injection Commonly known as: ROBINUL Inject 1 mL (0.2 mg total) into the skin every 4 (four) hours as needed (excessive secretions).   glycopyrrolate 0.2 MG/ML injection Commonly known as: ROBINUL Inject 1 mL (0.2 mg total) into the vein every 4 (four) hours as needed (excessive secretions).   haloperidol 0.5 MG tablet Commonly known as: HALDOL Take 1 tablet (0.5 mg total) by mouth every 4 (four) hours as needed for agitation (or delirium).   haloperidol 2 MG/ML solution Commonly known as: HALDOL Place 0.3 mLs (0.6  mg total) under the tongue every 4 (four) hours as needed for agitation (or delirium).   haloperidol lactate 5 MG/ML injection Commonly known as: HALDOL Inject 0.1 mLs (0.5 mg total) into the vein every 4 (four) hours as needed (or delirium).   LORazepam 1 MG tablet Commonly known as: ATIVAN Take 1 tablet (1 mg total) by mouth every 4 (four) hours as needed for anxiety or sleep.   morphine 2 MG/ML injection Inject 1 mL (2 mg total) into the vein every hour as needed (SOB, agitation).   morphine CONCENTRATE 10 MG/0.5ML Soln concentrated solution Place 0.5 mLs (10 mg total) under the tongue every hour as needed for severe pain, anxiety or shortness of breath.   ondansetron 4 MG disintegrating tablet Commonly known as: ZOFRAN-ODT Take 1 tablet (4 mg total) by mouth every 6 (six) hours as needed for nausea.   ondansetron 4 MG/2ML Soln injection Commonly known as: ZOFRAN Inject 2 mLs (4 mg total) into the vein every 6 (six) hours as needed for nausea.   polyvinyl alcohol 1.4 % ophthalmic solution Commonly known as: LIQUIFILM TEARS Place 1 drop into both eyes 4 (four) times daily as needed for dry eyes.      Disposition and follow-up:   Chad Franklin was discharged from [facility] in poor condition.  At the hospital follow up visit please address:  1.  A. fib with RVR: On metoprolol and Eliquis prior to hospitalization.  Was put on a   diltiazem drip and weaned off.  Patient is no longer on antiarrhythmics since he was made full comfort care prior to discharge.  2.  HFrEF exacerbation: Patient initially came in with volume overloaded.  Diuresed with Lasix drip and milrinone for volume optimization prior to discharge for comfort care.  4.  Labs / imaging needed at time of follow-up: none  5.  Pending labs/ test needing follow-up: none  Follow-up Appointments: [place] Follow up.   Specialty: Home Health Services  Contact information: [address] [place] [address] [phone]          Hospital Course by problem list:  1.  A. fib with RVR 2.  HFrEF exacerbation 3. Delerium   In summary, 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 fib, HFrEF (EF 25-30%), and HTN who presented with A. fib with RVR and CHF exacerbation.  The patient was initially put on a diltiazem drip with resolution of RVR.  However his hospital course was complicated by recurrent A. fib with RVR.  The cardiology and the heart failure team were consulted and assisted [place] with managing his A. fib and diuresis.  The patient's clinical course was not showing significant improvement.  On the day of discharge the patient was altered, picking at lines,unresponsive and not following commands. The patient and his daughters met with the palliative care team, it was ultimately decided the patient was to be discharged to hospice facility and to focus on comfort care.   Discharge Vitals:   BP (!) 141/86   Pulse (!) 141   Temp 97.9 F (36.6 C) (Oral)   Resp 20   Ht 5' 8" (1.727 m)   Wt 103.5 kg Comment: scale a  SpO2 96%   BMI 34.68 kg/m   Pertinent Labs, Studies, and Procedures:  CBC Latest Ref Rng & Units 02/25/2019 02/25/2019 02/23/2019  WBC 4.0 - 10.5 K/uL 8.8 8.5 9.3  Hemoglobin 13.0 - 17.0 g/dL 12.5(L) 12.0(L) 12.3(L)  Hematocrit 39.0 - 52.0 % 38.3(L) 37.3(L) 39.0  Platelets 150 - 400 K/uL 214 207 210   BMP Latest Ref Rng & Units 02/26/2019 02/25/2019 02/24/2019  Glucose 70 - 99 mg/dL 117(H) 120(H) 115(H)  BUN 8 - 23 mg/dL 22 30(H) 39(H)  Creatinine 0.61 - 1.24 mg/dL 1.24 1.30(H) 1.67(H)  Sodium 135 - 145 mmol/L 139 139 138  Potassium 3.5 - 5.1 mmol/L 3.5 3.2(L) 3.2(L)  Chloride 98 - 111 mmol/L 95(L) 96(L) 98  CO2 22 - 32 mmol/L _0 Calcium 8.9 - 10.3 mg/dL 8.1(L) 8.2(L) 8.2(L)   CT head: IMPRESSION: 1. Enlargement of cardiac  silhouette. 2. Minimal chronic bronchitic and interstitial changes. 3. No acute abnormalities.  CXR: IMPRESSION: 1. Right upper extremity PICC terminates at the mid SVC. 2. No pneumothorax. 3. Stable cardiomegaly. 4. Low lung volumes with chronic bronchitic and interstitial changes. 5. Elevation of the right hemidiaphragm with some adjacent linear atelectasis.  Echocardiogram: IMPRESSIONS  1. Left ventricular ejection fraction, by visual estimation, is 20 to 25%. The left ventricle has severely decreased function. There is no left ventricular hypertrophy.  2. Left ventricular diastolic function could not be evaluated.  3. Right ventricular volume and pressure overload.  4. Global right ventricle has severely reduced systolic function.The right ventricular size is severely enlarged. No increase in right ventricular wall thickness.  5. Left atrial size was severely dilated.  6. Right  atrial size was severely dilated.  7. The mitral valve is normal in structure. Moderate mitral valve regurgitation. No evidence of mitral stenosis.  8. The tricuspid valve is normal in structure. Tricuspid valve regurgitation moderate.  9. The aortic valve is normal in structure. Aortic valve regurgitation is not visualized. No evidence of aortic valve sclerosis or stenosis. 10. The pulmonic valve was normal in structure. Pulmonic valve regurgitation is mild. 11. Moderately elevated pulmonary artery systolic pressure. 12. The tricuspid regurgitant velocity is 2.84 m/s, and with an assumed right atrial pressure of 15 mmHg, the estimated right ventricular systolic pressure is moderately elevated at 47.3 mmHg. 13. The inferior vena cava is dilated in size with <50% respiratory variability, suggesting right atrial pressure of 15 mmHg.  Discharge Instructions:  You for allowing us to take care of you during hospitalization.  Below is a summary of what we treated:  1.  Atrial fibrillation and heart failure  -We will focus on treating her symptoms and making you comfortable.  List of medications have been prescribed to maximize comfort while you are in the hospice facility.  If you have any questions or concerns, please feel free to reach out to us.  Signed:  , MD Internal Medicine, PGY1 Pager: [phone]  02/26/2019,2:59 PM  

## 2019-02-26 NOTE — Progress Notes (Signed)
  Date: 02/26/2019  Patient name: Chad Franklin  Medical record number: [id number]  Date of birth: 26-Jul-1934        I have seen and evaluated this patient and I have discussed the plan of care with the house staff. Please see their note for complete details. I concur with their findings with the following additions/corrections: [name] was seen this morning on team rounds.  He was encephalopathic which is new from yesterday.  Heart failure has stopped his milrinone.  He is now comfort care only and palliative care is working on [employer].  Bartholomew Crews, MD 02/26/2019, 2:04 PM

## 2019-02-26 NOTE — Progress Notes (Signed)
Chad Franklin to be D/C'd Hospice facility per MD order.  Discussed with the family and all questions fully answered.  VSS, Skin clean, dry and intact without evidence of skin break down. Skin integrity documented.   PICC line in place; see RN note.   An After Visit Summary was printed and given to PTAR.   D/c education completed with patient/family including follow up instructions, medication list, d/c activities limitations if indicated, with other d/c instructions as indicated by MD - patient able to verbalize understanding, all questions fully answered.   Patient instructed to return to ED, call 911, or call MD for any changes in condition.   Patient escorted via stretcher by PTAR to [place].   Howard Pouch 02/26/2019 8:10 PM

## 2019-02-26 NOTE — Progress Notes (Signed)
Received call from patient daughter Chad Franklin at 1110 inquired about accompanying mom to visit father at hospital. Provided daughter with patient current status and confirmed poor prognosis. Upon callback informed daughter and mother able to visit at bedside (patient can have more than one visitor).   Spoke to NP at bedside during patient care approx 1130, informed patient disoriented and restless. Not able to eat/drink due to disorientation.   Informed MD Chad Franklin via phone at approx 1213 morning meds not administered given patient's disorientation.   Family arrive approx 1245  1319 Bensimhon MD at bedside; will follow-up with palliative about morphine drip and discharge plan to hospice.   Explained to fam at bedside morphine given to ease work of breathing and discomfort. Informed to notify RN if observe increased WOB or discomfort, demonstrated how to use call bell and lights at 1440.   1420 call from Chad Franklin at Chad Franklin informed disoriented, bedbound given disorientation, however, able to move extremities, last food and drink yesterday and no continuous infusions.   Per CM at 1445 patient to leave via ambulance to hospice.   Per call from [place] at 1505; morphine and zofran injection not available.   Paged Chad Franklin at Chad Franklin spoke with to update med orders.   Called report to Chad Franklin at [phone].  Ok to leave picc line Chad Franklin); saline lock it. informed trace edema, unna boots, 2L on [place], LUE weakness, non productive cough but congested, family at bedside, unsure of swallowing due to disorientation, skin bruising and left hip bruise, wife need grief support per palliative NP note.   Chad Franklin arive at 4128, RN to bedside, saline locked PICC, provided report to Chad Franklin and last BP of 129/87 at 1118. Patient had removed condom cath. Patient given PRN morphine for restlessness during interaction with Chad Franklin and transfer to stretcher.   Left unit with Chad Franklin at  1750. No condom cath, gown and bed pad changed.

## 2019-02-26 NOTE — TOC Initial Note (Addendum)
Transition of Care [facility]) - Initial/Assessment Note    Patient Details  Name: Chad Franklin MRN: [id number] Date of Birth: Dec 15, 1934  Transition of Care [facility]) CM/SW Contact:    Leone Haven, RN Phone Number: 02/26/2019, 1:14 PM  Clinical Narrative:                 NCM received consult for Residential Hospice for for [facility] [place].  NCM confirmed with daughter , Steward Drone.  NCM made referral to [facility] with [facility] [place].  Delray Alt will get back with this NCM to let know when they can take patient. They can take patient today.  PTAR called.      Expected Discharge Plan: Hospice Medical Facility     Patient Goals and CMS Choice Patient states their goals for this hospitalization and ongoing recovery are:: hospice facility CMS Medicare.gov Compare Post Acute Care list provided to:: Patient Represenative (must comment)(daughter, Steward Drone) Choice offered to / list presented to : Adult Children  Expected Discharge Plan and Services Expected Discharge Plan: Hospice Medical Facility In-house Referral: Hospice / Palliative Care Discharge Planning Services: CM Consult Post Acute Care Choice: Hospice                   DME Arranged: (Hospice facility)         HH Arranged: NA          Prior Living Arrangements/Services   Lives with:: Spouse Patient language and need for interpreter reviewed:: Yes        Need for Family Participation in Patient Care: Yes (Comment) Care giver support system in place?: Yes (comment)   Criminal Activity/Legal Involvement Pertinent to Current Situation/Hospitalization: No - Comment as needed  Activities of Daily Living Home Assistive Devices/Equipment: Walker (specify type) ADL Screening (condition at time of admission) Patient's cognitive ability adequate to safely complete daily activities?: Yes Is the patient deaf or have difficulty hearing?: No Does the patient have difficulty seeing, even when wearing  glasses/contacts?: No Does the patient have difficulty concentrating, remembering, or making decisions?: Yes Patient able to express need for assistance with ADLs?: Yes Does the patient have difficulty dressing or bathing?: Yes Independently performs ADLs?: Yes (appropriate for developmental age) Does the patient have difficulty walking or climbing stairs?: Yes Weakness of Legs: Both Weakness of Arms/Hands: Both(more on left due to previous CVA)  Permission Sought/Granted                  Emotional Assessment              Admission diagnosis:  Atrial fibrillation with RVR (HCC) [I48.91] Fall, initial encounter [W19.XXXA] Acute on chronic congestive heart failure, unspecified heart failure type [facility]) [I50.9] Patient Active Problem List   Diagnosis Date Noted  . Palliative care by specialist   . Goals of care, counseling/discussion   . Do not attempt intubation   . Do not resuscitate   . Advanced care planning/counseling discussion   . Acute exacerbation of CHF (congestive heart failure) (HCC) 02/22/2019  . Fall   . Pure hypercholesterolemia 11/11/2018  . Orthostasis 10/17/2018  . Abnormality of gait 08/15/2018  . Other fatigue 06/30/2018  . Hemiparesis affecting left side as late effect of stroke (HCC) 05/23/2018  . History of cardioembolic cerebrovascular accident (CVA) 12/19/2017  . Hyperlipidemia 12/19/2017  . Permanent atrial fibrillation (HCC) 12/19/2017  . Chronic systolic heart failure (HCC) 12/19/2017  . Diabetes mellitus type 2 in obese (HCC)   . Morbid obesity (  HCC)   . Benign essential HTN   . Hypoalbuminemia due to protein-calorie malnutrition ([place])   . Sleep disturbance   . Ischemic stroke of frontal lobe ([place]) 11/20/2017  . History of depression   . Essential hypertension   . Chronic atrial fibrillation ([place])   . Acute combined systolic and diastolic congestive heart failure ([place])   . Diabetes mellitus type 2 in nonobese (HCC)   . Atrial  fibrillation with RVR ([place])   . Hypokalemia   . Acute blood loss anemia   . Diabetes mellitus 10/26/2010  . GERD (gastroesophageal reflux disease) 10/26/2010  . COPD (chronic obstructive pulmonary disease) ([place]) 10/26/2010  . Depression 10/26/2010  . Bilateral swelling of feet 10/26/2010  . Palpitations 10/26/2010   PCP:  Cyndi Bender, PA-C Pharmacy:   [facility] - [place], [place] [address] Phone: [phone] Fax: [phone]  [facility] [place], [place] - [address] [place] [address] Phone: [phone] Fax: [phone]     Social Determinants of Health (SDOH) Interventions    Readmission Risk Interventions No flowsheet data found.

## 2019-02-26 NOTE — Progress Notes (Signed)
Daily Progress Note   Patient Name: Chad Franklin       Date: 02/26/2019 DOB: 1934/12/27  Age: 84 y.o. MRN#: [id number] Attending Physician: Burns Spain, MD Primary Care Physician: Lonie Peak, PA-C Admit Date: 02/21/2019  Reason for Consultation/Follow-up: Establishing goals of care  Subjective: Patient not responding or following commands today. Lying in bed, appears uncomfortable. I discussed his situation with his daughter Chad Franklin. We discussed continued attempts at stabilization, labs, IV medicines, further workup- vs transition to full comfort- stopping IV fluids, labs, and giving medicine only for comfort with discharge to Hospice home. Chad Franklin requests comfort measures only and request [facility].  ROS  Length of Stay: 4  Current Medications: Scheduled Meds:  . alum & mag hydroxide-simeth  30 mL Oral Once  . Chlorhexidine Gluconate Cloth  6 each Topical Daily  . PARoxetine  20 mg Oral Daily    Continuous Infusions: . amiodarone 30 mg/hr (02/26/19 0522)    PRN Meds: acetaminophen **OR** acetaminophen, LORazepam, morphine CONCENTRATE, sodium chloride flush  Physical Exam          Vital Signs: BP (!) 141/86   Pulse (!) 141   Temp 97.9 F (36.6 C) (Oral)   Resp 20   Ht 5\' 8"  (1.727 m)   Wt 103.5 kg Comment: scale a  SpO2 96%   BMI 34.68 kg/m  SpO2: SpO2: 96 % O2 Device: O2 Device: Nasal Cannula O2 Flow Rate: O2 Flow Rate (L/min): 2 L/min  Intake/output summary:   Intake/Output Summary (Last 24 hours) at 02/26/2019 1202 Last data filed at 02/26/2019 14/12/2018 Gross per 24 hour  Intake 989.29 ml  Output 2575 ml  Net -1585.71 ml   LBM: Last BM Date: 02/25/19 Baseline Weight: Weight: 99.8 kg Most recent weight: Weight: 103.5 kg(scale a)    Palliative Assessment/Data:      Patient Active Problem List   Diagnosis Date Noted  . Palliative care by specialist   . Goals of care, counseling/discussion   . Do not attempt intubation   . Do not resuscitate   . Advanced care planning/counseling discussion   . Acute exacerbation of CHF (congestive heart failure) (HCC) 02/22/2019  . Fall   . Pure hypercholesterolemia 11/11/2018  . Orthostasis 10/17/2018  . Abnormality of gait 08/15/2018  . Other fatigue 06/30/2018  .  Hemiparesis affecting left side as late effect of stroke ([place]) 05/23/2018  . History of cardioembolic cerebrovascular accident (CVA) 12/19/2017  . Hyperlipidemia 12/19/2017  . Permanent atrial fibrillation ([place]) 12/19/2017  . Chronic systolic heart failure ([place]) 12/19/2017  . Diabetes mellitus type 2 in obese ([place])   . Morbid obesity ([place])   . Benign essential HTN   . Hypoalbuminemia due to protein-calorie malnutrition ([place])   . Sleep disturbance   . Ischemic stroke of frontal lobe ([place]) 11/20/2017  . History of depression   . Essential hypertension   . Chronic atrial fibrillation ([place])   . Acute combined systolic and diastolic congestive heart failure ([place])   . Diabetes mellitus type 2 in nonobese (HCC)   . Atrial fibrillation with RVR ([place])   . Hypokalemia   . Acute blood loss anemia   . Diabetes mellitus 10/26/2010  . GERD (gastroesophageal reflux disease) 10/26/2010  . COPD (chronic obstructive pulmonary disease) ([place]) 10/26/2010  . Depression 10/26/2010  . Bilateral swelling of feet 10/26/2010  . Palpitations 10/26/2010    Palliative Care Assessment & Plan   Patient Profile: 84 y.o. male  with past medical history of chronic a fib, chronic systolic heart failure (EF 25-30%), COPD, stroke, DM2 admitted on 02/21/2019 after a fall at home. Workup revealed advanced end stage heart failure. Heart failure team started him on milrinone, and now amiodorone. He is not candidate for further advanced  therapies or home inotropes. Palliative medicine for continued GOC.   Assessment/Recommendations/Plan   Transition to full comfort measures only  Request placement at residential Hospice  No further workup, d/c meds considered life prolonging  Add morphine 2mg  q1hr prn for comfort/SOB/agitation  Add robinul  0.55mcg q4hr prn for secretions  Haldol  .5mg  IV q4hrs agitation or delirium   Goals of Care and Additional Recommendations:  Limitations on Scope of Treatment: Full Comfort Care  Code Status:  DNR  Prognosis:   < 2 weeks decompensated end stage heart failure, decreased mental status, transition to full comfort measures only  Discharge Planning:  Hospice facility    Care plan was discussed with patient's daughter- Chad Franklin, medical team  Thank you for allowing the Palliative Medicine Team to assist in the care of this patient.   Time In: 1130 Time Out: 1215 Total Time 45 mins Prolonged Time Billed no      Greater than 50%  of this time was spent counseling and coordinating care related to the above assessment and plan.  Mariana Kaufman, AGNP-C Palliative Medicine   Please contact Palliative Medicine Team phone at [phone] for questions and concerns.

## 2019-02-26 NOTE — Progress Notes (Addendum)
Advanced Heart Failure Rounding Note  PCP-Cardiologist: Buford Dresser, MD   Subjective:    Co-ox this am was 63% but developed lethargy and confusion.  Now on full comfort care. Family waiting for hospice bed so other family members can see him.  He is sedated. Mild grunting.   Objective:   Weight Range: 103.5 kg Body mass index is 34.68 kg/m.   Vital Signs:   Temp:  [97.7 F (36.5 C)-97.9 F (36.6 C)] 97.9 F (36.6 C) (12/10 0516) Pulse Rate:  [83-141] 141 (12/10 0633) Resp:  [18-20] 20 (12/10 0516) BP: (109-141)/(60-90) 131/89 (12/10 0633) SpO2:  [94 %-99 %] 97 % (12/10 0516) Weight:  [103.5 kg-104.2 kg] 103.5 kg (12/10 0328) Last BM Date: 02/25/19  Weight change: Filed Weights   02/25/19 0101 02/25/19 1035 02/26/19 0328  Weight: 108.1 kg 104.2 kg 103.5 kg    Intake/Output:   Intake/Output Summary (Last 24 hours) at 02/26/2019 0741 Last data filed at 02/26/2019 0526 Gross per 24 hour  Intake 1353.57 ml  Output 5325 ml  Net -3971.43 ml      Physical Exam    General:  Elderly sedated but grunting and moving HEENT: normal Neck: supple. JVP 9 Carotids 2+ bilat; no bruits. No lymphadenopathy or thryomegaly appreciated. Cor: PMI nondisplaced. Irr tachy Lungs: coarse Abdomen: soft, nontender, nondistended. No hepatosplenomegaly. No bruits or masses. Good bowel sounds. Extremities: no cyanosis, clubbing, rash, tr edema Neuro: lethargic    EKG    No new EKGs to review   Labs    CBC Recent Labs    02/25/19 0340 02/25/19 1515  WBC 8.5 8.8  HGB 12.0* 12.5*  HCT 37.3* 38.3*  MCV 91.2 89.7  PLT 207 778   Basic Metabolic Panel Recent Labs    02/25/19 0340 02/26/19 0255  NA 139 139  K 3.2* 3.5  CL 96* 95*  CO2 30 31  GLUCOSE 120* 117*  BUN 30* 22  CREATININE 1.30* 1.24  CALCIUM 8.2* 8.1*   Liver Function Tests No results for input(s): AST, ALT, ALKPHOS, BILITOT, PROT, ALBUMIN in the last 72 hours. No results for input(s):  LIPASE, AMYLASE in the last 72 hours. Cardiac Enzymes No results for input(s): CKTOTAL, CKMB, CKMBINDEX, TROPONINI in the last 72 hours.  BNP: BNP (last 3 results) Recent Labs    02/21/19 2003  BNP 1,554.5*    ProBNP (last 3 results) No results for input(s): PROBNP in the last 8760 hours.   D-Dimer No results for input(s): DDIMER in the last 72 hours. Hemoglobin A1C No results for input(s): HGBA1C in the last 72 hours. Fasting Lipid Panel No results for input(s): CHOL, HDL, LDLCALC, TRIG, CHOLHDL, LDLDIRECT in the last 72 hours. Thyroid Function Tests No results for input(s): TSH, T4TOTAL, T3FREE, THYROIDAB in the last 72 hours.  Invalid input(s): FREET3  Other results:   Imaging    No results found.   Medications:     Scheduled Medications: . alum & mag hydroxide-simeth  30 mL Oral Once  . apixaban  5 mg Oral BID  . Chlorhexidine Gluconate Cloth  6 each Topical Daily  . PARoxetine  20 mg Oral Daily  . potassium chloride  40 mEq Oral BID  . pravastatin  10 mg Oral Daily  . ramelteon  8 mg Oral QHS  . sodium chloride flush  10-40 mL Intracatheter Q12H  . spironolactone  12.5 mg Oral Daily  . torsemide  40 mg Oral Daily    Infusions: . amiodarone 30  mg/hr (02/26/19 0522)  . milrinone 0.125 mcg/kg/min (02/26/19 0522)    PRN Medications: acetaminophen **OR** acetaminophen, LORazepam, morphine CONCENTRATE, sodium chloride flush    Patient Profile   83 y/o male with permanent AF, chronic systolic HF due to presumed NICM (myvoiew 2019 - no cath) LVEF 20-25% with severe RV HK previous CVA with left sided weakness admitted for a/c systolic HF w/ cardiogenic shock.   Assessment/Plan   1. Cardiogenic Shock Biventricular HF  ECHO EF 20% Severely reduced LV/RV function.  - Admitted w/ cardiogenic shock. Initial Co-ox prior to inotrope 36%.  2. Chronic  A Fib  3. Elevated HS Troponin  4. H/O CVA   He is now on comfort care awaiting a Hospice placement.  I spoke with family.   Length of Stay: 4  Arvilla Meres, MD  2:13 PM   Advanced Heart Failure Team Pager [phone] (M-F; 7a - 4p)  Please contact [facility] for night-coverage after hours (4p -7a ) and weekends on [email or web address]

## 2019-02-26 NOTE — Progress Notes (Signed)
Subjective:   Pt seen at the bedside on rounds this AM. The pt is disoriented today, shivering, and not responsive to commands. When the pt tries to speak, he makes intelligible moans and mumbles. Eyes are closed for most of the encounter. He has the soft mittens on, and frequently flails his arms. We checked his pulse ox at the bedside and was saturating well at 96%.  Objective:  Pertinent labs/Imaging: CBC Latest Ref Rng & Units 02/25/2019 02/25/2019 02/23/2019  WBC 4.0 - 10.5 K/uL 8.8 8.5 9.3  Hemoglobin 13.0 - 17.0 g/dL 12.5(L) 12.0(L) 12.3(L)  Hematocrit 39.0 - 52.0 % 38.3(L) 37.3(L) 39.0  Platelets 150 - 400 K/uL 214 207 210   BMP Latest Ref Rng & Units 02/26/2019 02/25/2019 02/24/2019  Glucose 70 - 99 mg/dL 117(H) 120(H) 115(H)  BUN 8 - 23 mg/dL 22 30(H) 39(H)  Creatinine 0.61 - 1.24 mg/dL 1.24 1.30(H) 1.67(H)  Sodium 135 - 145 mmol/L 139 139 138  Potassium 3.5 - 5.1 mmol/L 3.5 3.2(L) 3.2(L)  Chloride 98 - 111 mmol/L 95(L) 96(L) 98  CO2 22 - 32 mmol/L '31 30 28  ' Calcium 8.9 - 10.3 mg/dL 8.1(L) 8.2(L) 8.2(L)   Echocardiogram: 1. Left ventricular ejection fraction, by visual estimation, is 20 to 25%. The left ventricle has severely decreased function. There is no left ventricular hypertrophy.  2. Left ventricular diastolic function could not be evaluated.  3. Right ventricular volume and pressure overload.  4. Global right ventricle has severely reduced systolic function.The right ventricular size is severely enlarged. No increase in right ventricular wall thickness.  5. Left atrial size was severely dilated.  6. Right atrial size was severely dilated.  7. The mitral valve is normal in structure. Moderate mitral valve regurgitation. No evidence of mitral stenosis.  8. The tricuspid valve is normal in structure. Tricuspid valve regurgitation moderate.  9. The aortic valve is normal in structure. Aortic valve regurgitation is not visualized. No evidence of aortic valve sclerosis or  stenosis. 10. The pulmonic valve was normal in structure. Pulmonic valve regurgitation is mild. 11. Moderately elevated pulmonary artery systolic pressure. 12. The tricuspid regurgitant velocity is 2.84 m/s, and with an assumed right atrial pressure of 15 mmHg, the estimated right ventricular systolic pressure is moderately elevated at 47.3 mmHg. 13. The inferior vena cava is dilated in size with <50% respiratory variability, suggesting right atrial pressure of 15 mmHg.  Vital signs in last 24 hours: Vitals:   02/26/19 0118 02/26/19 0328 02/26/19 0511 02/26/19 0516  BP: 129/80  (!) 141/90 (!) 141/90  Pulse: (!) 116  (!) 133 (!) 133  Resp: 19   20  Temp: 97.8 F (36.6 C)  97.9 F (36.6 C) 97.9 F (36.6 C)  TempSrc: Oral  Oral Oral  SpO2: 98%  94% 97%  Weight:  103.5 kg    Height:       Physical Exam: General: Uncomfortable appearing HEENT: NCAT, [place] in place CV: tachycardia, irregular rhythm, no murmurs rubs or gallops appreciated, 2+ pitting edema up to the the knees in lower extremities  PULM: Increased respiratory effort, diffuse wheezes present L>>R ABD: Soft and not distended in all quadrants NEURO: Disoriented. Does not follow commands. Frequently flailing arms and legs. Moves all extremities antigravity  Assessment/Plan:  Active Problems:   Atrial fibrillation with RVR (HCC)   Acute exacerbation of CHF (congestive heart failure) ([place])   Fall   Palliative care by specialist   Goals of care, counseling/discussion   Do not attempt  intubation   Do not resuscitate   Advanced care planning/counseling discussion  In summary, Chad Franklin is an 84 year old male with a pertinent past medical history of embolic CVA (residual left-sided weakness), A. fib (Eliquis), HFrEF (25-30%), hypertension, who presented with acute on chronic CHF secondary to A. fib with RVR.  #Delerium: Pt's AMS is off his baseline. Yesterday, the pt was sitting up in the bedside chair and just finished  eating his breakfast. Today, he was lying flat in bed and unresponsive and not following commands. BMP was unremarkable. He is wheezing more today, however his oxygenation was adequate at 96% on the bedside pulse ox. Pt has had episodes of AMS on milrinone in the past. The patient likley has worsening of his underlying a-fib and CHF. Perhaps the patient is has a brewing infection. Palliative care met with the patient and daughters yesterday, and the goal is to get him to home hospice. I do not think an extensive workup is indicated. -Will call the family for update -Appreciate HF team reccomendations  #HFrEF exacerbation (EF 20-25% 02/2019): Diuresed 5.3L in the past 24 hrs. Heart failure team following, and we appreciate their recommendations. Pt and daughters have discussed GOC with the Palliative Care team and have decided to go home with hospice support.  -HF team recommendations  -Continue milrinone . Output improved to 5.3 L in the last 24 hrs.   -Torsemide 40 mg orally  -Spironolactone 25 mg daily -Palliative Care consult placed, will follow up recommendations once available.  - Strict I/Os, daily weights  #A. fib with RVR: Patient is currently in A. Fib w/ RVR again (HR 150s), but is asymptomatic and hemodynamically stable.  -Eliquis 2.5 mg BID  -Amiodarone drip, plan to wean ionotropes and d/c home with home hospice.  #HLD -Pravastatin 10 mg daily   #FEN/GI -Diet: Heart Healthy -Fluids: none  #DVT prophylaxis -Eliquis 2.43m BID  #CODE STATUS: DNR  #Dispo: Anticipated discharge pending clinical improvement.   AEarlene Plater MD Internal Medicine, PGY1 Pager: [phone] 02/26/2019,10:52 AM

## 2019-02-26 NOTE — Care Management Important Message (Signed)
Important Message  Patient Details  Name: Chad Franklin MRN: [id number] Date of Birth: Nov 02, 1934   Medicare Important Message Given:  Yes     Shelda Altes 02/26/2019, 11:54 AM

## 2019-02-27 ENCOUNTER — Ambulatory Visit: Payer: Medicare HMO | Admitting: Cardiology

## 2019-03-06 ENCOUNTER — Ambulatory Visit: Payer: Medicare HMO | Admitting: Cardiology

## 2019-03-10 ENCOUNTER — Telehealth: Payer: Self-pay

## 2019-03-10 NOTE — Telephone Encounter (Signed)
CALLED [facility] TO CHANGE TO TELE HEALTH AND WAS ADVISED BY DAUGHTER FATHER PASSED AWAY

## 2019-03-17 ENCOUNTER — Encounter: Payer: Medicare HMO | Admitting: Physical Medicine & Rehabilitation

## 2019-03-20 DEATH — deceased

## 2019-12-14 IMAGING — DX DG CHEST 1V PORT
1 series · 1 of 1 positions shown · non-contrast
Comparison: Portable exam 8993 hours compared to 08/15/2016

CLINICAL DATA: Chest tightness earlier today, nausea, vomiting,
history diabetes mellitus, stroke, COPD, former smoker

EXAM:
PORTABLE CHEST 1 VIEW

[chest ap]
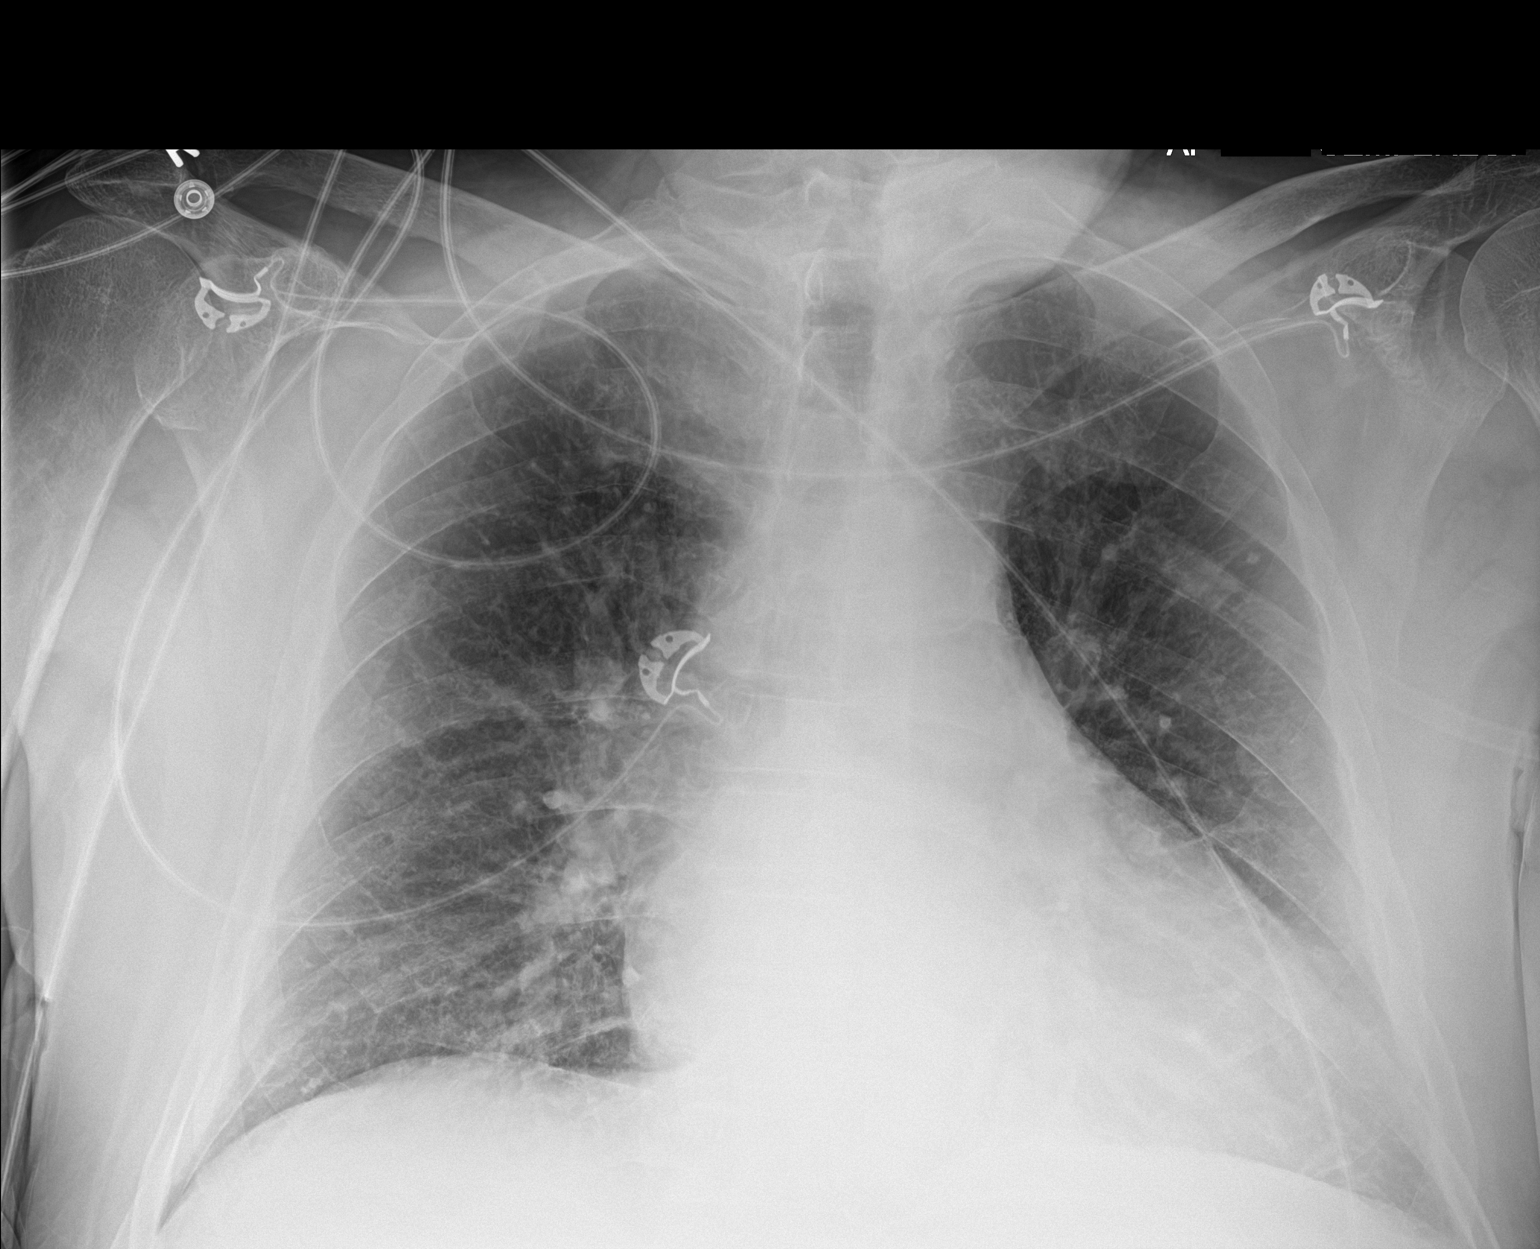

[1 of 1 positions shown; findings below may reference images not displayed]

FINDINGS: Enlargement of cardiac silhouette with pulmonary vascular
congestion.

Mediastinal contours normal.

Minimal chronic accentuation of interstitial markings which could
reflect minimal chronic pulmonary edema.

No segmental consolidation, pleural effusion or pneumothorax.

Bones demineralized.
IMPRESSION: Enlargement of cardiac silhouette with pulmonary vascular
congestion.

Chronic accentuation of interstitial markings question minimal
chronic failure.
